# Patient Record
Sex: Female | Born: 1950
Health system: Southern US, Community
[De-identification: ages and names within clinical notes are randomized; demographics above are authoritative.]

## PROBLEM LIST (undated history)

## (undated) DIAGNOSIS — M1990 Unspecified osteoarthritis, unspecified site: Secondary | ICD-10-CM

## (undated) DIAGNOSIS — R519 Headache, unspecified: Secondary | ICD-10-CM

## (undated) DIAGNOSIS — I10 Essential (primary) hypertension: Secondary | ICD-10-CM

## (undated) DIAGNOSIS — E785 Hyperlipidemia, unspecified: Secondary | ICD-10-CM

## (undated) DIAGNOSIS — I2510 Atherosclerotic heart disease of native coronary artery without angina pectoris: Secondary | ICD-10-CM

## (undated) DIAGNOSIS — K219 Gastro-esophageal reflux disease without esophagitis: Secondary | ICD-10-CM

## (undated) HISTORY — PX: CHOLECYSTECTOMY: SHX55

## (undated) HISTORY — DX: Atherosclerotic heart disease of native coronary artery without angina pectoris: I25.10

## (undated) HISTORY — DX: Hyperlipidemia, unspecified: E78.5

## (undated) HISTORY — PX: CORONARY STENT PLACEMENT: SHX1402

## (undated) HISTORY — PX: PARTIAL HYSTERECTOMY: SHX80

---
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
## 1998-02-20 ENCOUNTER — Other Ambulatory Visit: Admission: RE | Admit: 1998-02-20 | Discharge: 1998-02-20 | Payer: Self-pay | Admitting: Gynecology

## 1999-11-18 HISTORY — PX: CARDIAC CATHETERIZATION: SHX172

## 2000-03-24 ENCOUNTER — Encounter: Payer: Self-pay | Admitting: Occupational Medicine

## 2000-03-24 ENCOUNTER — Ambulatory Visit (HOSPITAL_COMMUNITY): Admission: RE | Admit: 2000-03-24 | Discharge: 2000-03-24 | Payer: Self-pay | Admitting: Occupational Medicine

## 2000-08-12 ENCOUNTER — Ambulatory Visit (HOSPITAL_COMMUNITY): Admission: RE | Admit: 2000-08-12 | Discharge: 2000-08-13 | Payer: Self-pay | Admitting: Cardiology

## 2000-10-21 ENCOUNTER — Inpatient Hospital Stay (HOSPITAL_COMMUNITY): Admission: EM | Admit: 2000-10-21 | Discharge: 2000-10-23 | Payer: Self-pay | Admitting: Emergency Medicine

## 2001-03-03 ENCOUNTER — Ambulatory Visit (HOSPITAL_COMMUNITY): Admission: RE | Admit: 2001-03-03 | Discharge: 2001-03-04 | Payer: Self-pay | Admitting: Cardiology

## 2001-10-25 ENCOUNTER — Encounter (INDEPENDENT_AMBULATORY_CARE_PROVIDER_SITE_OTHER): Payer: Self-pay

## 2001-10-25 ENCOUNTER — Ambulatory Visit (HOSPITAL_COMMUNITY): Admission: RE | Admit: 2001-10-25 | Discharge: 2001-10-25 | Payer: Self-pay | Admitting: *Deleted

## 2002-03-17 ENCOUNTER — Ambulatory Visit (HOSPITAL_COMMUNITY): Admission: RE | Admit: 2002-03-17 | Discharge: 2002-03-17 | Payer: Self-pay | Admitting: Cardiology

## 2002-03-17 ENCOUNTER — Encounter: Payer: Self-pay | Admitting: Cardiology

## 2002-06-16 ENCOUNTER — Other Ambulatory Visit: Admission: RE | Admit: 2002-06-16 | Discharge: 2002-06-16 | Payer: Self-pay | Admitting: Gynecology

## 2003-07-08 ENCOUNTER — Encounter: Payer: Self-pay | Admitting: Internal Medicine

## 2003-07-08 ENCOUNTER — Encounter: Admission: RE | Admit: 2003-07-08 | Discharge: 2003-07-08 | Payer: Self-pay | Admitting: Internal Medicine

## 2003-09-14 ENCOUNTER — Ambulatory Visit (HOSPITAL_COMMUNITY): Admission: RE | Admit: 2003-09-14 | Discharge: 2003-09-15 | Payer: Self-pay | Admitting: Cardiology

## 2004-07-29 ENCOUNTER — Ambulatory Visit (HOSPITAL_COMMUNITY): Admission: RE | Admit: 2004-07-29 | Discharge: 2004-07-29 | Payer: Self-pay | Admitting: Cardiology

## 2004-11-15 ENCOUNTER — Ambulatory Visit (HOSPITAL_BASED_OUTPATIENT_CLINIC_OR_DEPARTMENT_OTHER): Admission: RE | Admit: 2004-11-15 | Discharge: 2004-11-15 | Payer: Self-pay | Admitting: Urology

## 2004-11-15 ENCOUNTER — Ambulatory Visit (HOSPITAL_COMMUNITY): Admission: RE | Admit: 2004-11-15 | Discharge: 2004-11-15 | Payer: Self-pay | Admitting: Urology

## 2004-12-24 ENCOUNTER — Other Ambulatory Visit: Admission: RE | Admit: 2004-12-24 | Discharge: 2004-12-24 | Payer: Self-pay | Admitting: Gynecology

## 2005-01-24 ENCOUNTER — Encounter (INDEPENDENT_AMBULATORY_CARE_PROVIDER_SITE_OTHER): Payer: Self-pay | Admitting: Specialist

## 2005-01-24 ENCOUNTER — Ambulatory Visit (HOSPITAL_COMMUNITY): Admission: RE | Admit: 2005-01-24 | Discharge: 2005-01-24 | Payer: Self-pay | Admitting: *Deleted

## 2006-06-11 ENCOUNTER — Encounter (INDEPENDENT_AMBULATORY_CARE_PROVIDER_SITE_OTHER): Payer: Self-pay | Admitting: *Deleted

## 2006-06-11 ENCOUNTER — Ambulatory Visit (HOSPITAL_COMMUNITY): Admission: RE | Admit: 2006-06-11 | Discharge: 2006-06-11 | Payer: Self-pay | Admitting: *Deleted

## 2007-05-13 ENCOUNTER — Encounter: Admission: RE | Admit: 2007-05-13 | Discharge: 2007-05-13 | Payer: Self-pay | Admitting: Internal Medicine

## 2009-12-30 ENCOUNTER — Emergency Department (HOSPITAL_COMMUNITY): Admission: EM | Admit: 2009-12-30 | Discharge: 2009-12-30 | Payer: Self-pay | Admitting: Family Medicine

## 2010-01-31 ENCOUNTER — Emergency Department (HOSPITAL_COMMUNITY): Admission: EM | Admit: 2010-01-31 | Discharge: 2010-01-31 | Payer: Self-pay | Admitting: Emergency Medicine

## 2011-04-04 NOTE — Cardiovascular Report (Signed)
[place]  Patient:    Madison Mosley, Madison Mosley Visit Number: [id number] MRN: [id number]          Service Type: CAT Location: [facility] [id number] Attending Physician:  Silvestre Mesi Dictated by:   Aram Candela Aleen Campi, M.D. Proc. Date: 03/17/02 Admit Date:  03/17/2002 Discharge Date: 03/17/2002                          Cardiac Catheterization  PROCEDURES: 1. Left heart catheterization. 2. Coronary cineangiography. 3. Left ventricular cineangiography. 4. Perclose of the left femoral artery.  INDICATION FOR PROCEDURES:  This 60 year old female has a history of two-vessel coronary artery disease and is status post angioplasty of her proximal LAD and first diagonal branch in December 2001.  She then had a recurrence of unstable angina in April 2002 at which time she had angioplasty with stent placement in her mid right coronary artery.  She has done well until recently when she had onset of chest pain again and this was felt to be due to recurrent coronary stenosis with myocardial ischemia.  She was then scheduled for repeat cardiac catheterization and possible angioplasty.  DESCRIPTION OF PROCEDURE:  After signing an informed consent, the patient was premedicate with 50 mg of Benadryl intravenously and brought to the cardiac catheterization lab.  Her left groin was prepped and draped in a sterile fashion and anesthetized locally with 1% lidocaine.  A #6 French introducer sheath was inserted percutaneously into the left femoral artery.  The #6 [place] #4 Judkins coronary catheter were used to make injections into the native coronary arteries.  A #6 French pigtail catheter was used to measure pressures in the left ventricle and aorta and to make a midstream injection into the left ventricle.  The patient tolerated the procedure well and no complications were noted.  At the end of the procedure, the catheter and sheath were removed from the left femoral  artery and hemostasis was easily obtained with a Perclose closure system.  MEDICATIONS GIVEN:  None.  HEMODYNAMIC DATA: 1. Left ventricular pressure 143/5-20. 2. Aortic pressure 141/73 with a mean of 99. 3. Left ventricular ejection fraction 70%.  CINE FINDINGS:  CORONARY CINEANGIOGRAPHY: 1. Left coronary artery:  The ostium and left main appear normal.  2. Left anterior descending artery:  The proximal LAD is the site of prior    angioplasty with stent placement.  There has been a very minor restenosis    within the stent of approximately 20-30%.  There is very normal antegrade    flow and no evidence for flow restriction.  The first anterolateral branch    is a large branch which arises just distally to the stent and now appears    normal without evidence of any restenosis.  There is normal antegrade flow.  3. Circumflex coronary artery:  There is a minor focal 20-30% stenosis in the    proximal segment.  Otherwise, the circumflex appears normal with normal    antegrade flow.  4. Right coronary artery:  The ostium and proximal segment appears normal.    The middle segment is the segment of prior angioplasty with stent placement    and there is now a minor 10-20% restenosis within the proximal portion of    the stent.  Otherwise, there is very good and normal antegrade flow and no    evidence for flow restriction in the middle segment.  The distal segment at  the crux and posterolateral appears normal.  The posterior descending has a    focal 20% stenosis in its proximal segment.  There is normal distal runoff    throughout the right coronary system.  LEFT VENTRICULAR CINEANGIOGRAM:  The left ventricular chamber size and contractility appears normal.  The mitral and aortic valves appear normal.  FINAL DIAGNOSES: 1. History of two-vessel coronary artery disease with very good long-term    results in her angioplasty sites of proximal left anterior descending    artery with  stent, first diagonal with percutaneous transluminal coronary    angioplasty, mid right coronary artery with stent. 2. Normal left ventricular function. 3. Successful Perclose of the left femoral artery.  DISPOSITION:  The patient was returned to the short-stay unit for further monitoring prior to discharge later today.  She was instructed to continue on the same medications and on the same exercise regimen and follow up in the office in several weeks after discharge. Dictated by:   Aram Candela. Aleen Campi, M.D. Attending Physician:  Silvestre Mesi DD:  03/17/02 TD:  03/20/02 Job: [id number]

## 2011-04-04 NOTE — Cardiovascular Report (Signed)
[place]. [facility]  Patient:    Madison Mosley, Madison Mosley                     MRN: [id number] Proc. Date: 03/03/01 Adm. Date:  [id number] Disc. Date: [id number] Attending:  Silvestre Mesi CC:         Cardiac Catheterization Lab at [facility]                        Cardiac Catheterization  PROCEDURES: 1. Left heart catheterization. 2. Coronary cineangiography. 3. Left ventricular cineangiography. 4. Abdominal aortogram. 5. Angioplasty with primary stenting of the mid right coronary artery. 6. Perclose of the right femoral artery.  INDICATION FOR PROCEDURES:  This 60 year old female has a history of coronary artery disease status post coronary artery angioplasty of her proximal left anterior descending in December 2000.  This was followed by a pain free period of several months and the reoccurrence of her typical exertional angina.  A treadmill exercise tolerance test was double positive with exertional angina and ST segment depression.  She was then scheduled for repeat cardiac catheterization and possible angioplasty to look at the stented area in her LAD and also her other lesions which were mild to moderate in her circumflex and right coronary arteries.  PROCEDURE IN DETAIL:  After signing an informed consent, the patient was premedicated with 50 mg of Benadryl intravenously and brought to the cardiac catheterization lab.  Her right groin was prepped and draped in a sterile fashion and anesthetized locally with 1% lidocaine.  A 6-French introducer sheath was inserted percutaneously into the right femoral artery, and 6-French, #4 Judkins coronary cathethers were used to make injections into the coronary arteries.  A 6-French pigtail catheter was used to measure pressures in the left ventricle and aorta and to make midstream injections into the left ventricle and abdominal aorta.  After noting a good result in the stented area in her LAD with only very mild  restenosis of less then 20% and normal antegrade flow, we also noted a significant progression of disease in her mid right coronary artery lesion from 50% to 80% and corresponding to the area of ST abnormality on her treadmill.  We then discussed this with the patient and elected to proceed with an angioplasty procedure.  We selected a JR4 guide catheter, 6-French, which was advanced to the root of the aorta.  After inserting a short high-torque floppy guidewire, the tip of the guide catheter was then inserted into the ostium of the right coronary artery, and the guidewire was advanced through the mid right coronary artery lesion and positioned in the distal segment.  We then selected a 3.0 x 18 mm Multilink stent deployment system which was advanced over the guidewire and positioned within the right coronary artery lesion.  The stent was then deployed with two inflations, the first at 16 atmospheres for 20 seconds and the second at 16 atmospheres for 40 seconds.  After this final inflation, the balloon catheter was removed, and injection again in the right coronary artery showed an excellent angiographic result with 0% residual lesion and no evidence for dissection or clot.  The patient tolerated the procedure well, and no complications were noted at the end of the procedure.  The catheter and sheath were removed from the right femoral artery, and hemostasis was easily obtained with a Perclose closure system.  MEDICATIONS GIVEN:  Heparin 7600 units IV, nitroglycerin 200 units intracoronary.  HEMODYNAMIC DATA:  Left ventricular pressure 170/0 to 18, aortic pressure 163/83 with a mean of 119.  Left ventricular ejection fraction was estimated at 60%.  CINE FINDINGS:  CORONARY CINEANGIOGRAPHY:  Left coronary artery: The ostium and left main appear normal.  Left anterior descending artery: The proximal segment is the stented area, and there has been a mild restenosis of less than  20% in the entire length.  The first anterolateral branch also had angioplasty at its ostium, and this ostial lesion now appears normal.  There is very normal antegrade flow throughout the LAD and diagonal branch.  Circumflex coronary artery:  The circumflex has a moderate stenosis of 40 to 50% in its middle segment, and this appears to be mildly increased from the prior study last year.  There is normal antegrade flow.  Right coronary artery: The ostium appears normal.  There was a focal eccentric lesion in the middle segment with 80% maximum stenosis in a focal area just below the takeoff of the right ventricular branch.  The remainder of the right coronary artery appears normal.  LEFT VENTRICULAR CINEANGIOGRAM:  Left ventricular chamber size and contractility appear normal.  The mitral and aortic valves appear normal.  ABDOMINAL AORTOGRAM:  The abdominal aorta and renal arteries appear normal.  ANGIOPLASTY PROCEDURE:  Cine taken during the angioplasty procedure shows proper positioning of the guidewire and balloon catheter with a very good balloon form obtained.  Followup cine after stent deployment showed an excellent angiographic result with 0% residual lesion and no evidence of dissection or clot.  FINAL DIAGNOSES: 1. Good long-term appearance of the stented area in the proximal left    anterior descending artery and normal appearance of the angioplasty    site in the diagonal branch. 2. Significant progression of disease in the mid right coronary artery lesion,    now with a critical stenosis. 3. Mild to moderate stenosis, mid circumflex coronary artery with normal    flow. 4. Normal left ventricular function. 5. Normal mitral and aortic valves. 6. Normal abdominal aorta and renal arteries. 7. Successful angioplasty with primary stenting of the mid right coronary    artery lesion. 8. Successful Perclose of the right femoral artery.  DISPOSITION:  Will admit to the  short-stay unit for further monitoring today and plan for discharge in the a.m. DD:  03/03/01 TD:  03/04/01 Job: [id number]

## 2011-04-04 NOTE — Cardiovascular Report (Signed)
[place]. [facility]  Patient:    Madison Mosley, Madison Mosley                       MRN: [id number] Proc. Date: 10/22/00 Adm. Date:  [id number] Attending:  Silvestre Mesi CC:         Soyla Murphy. Renne Crigler, M.D.  Cath Lab, [facility]   Cardiac Catheterization  REFERRING PHYSICIAN:  Dr. Merri Brunette.  PROCEDURES: 1. Left heart catheterization. 2. Coronary cineangiography. 3. Left cineangiography. 4. Angioplasty with percutaneous transluminal coronary angioplasty of the    first diagonal branch through the side of the left anterior descending    stent. 5. Perclose of the right femoral artery.  INDICATIONS FOR PROCEDURE:  This 60 year old female has a history of since vessel coronary artery disease status post coronary artery angioplasty August 12, 2000 of her mid LAD, which blocked a mid diagonal branch and a diagonal branch ostial branch was dilated also at that same time.  She has had shortness of breath for several weeks and was referred for a Cardiolite study study yesterday, at which time it was positive for chest pain.  ST segment changes and the Cardiolite study was positive for anterolateral ischemia.  She was then admitted and scheduled for cardiac cath today.  PROCEDURE:  After signing an informed consent, the patient was premedicated with 50 mg of Benadryl intravenously and brought to the cardiac catheterization lab.  Her right groin was prepped and draped in a sterile fashion and anesthetized with 1% lidocaine  A 6-French introducer sheath was inserted percutaneously into the right femoral artery.  A 6-French 4 Judkins coronary catheters were used to make injections into the coronary arteries.  A 6-French pigtali catheter was used to measure pressures in the left ventricle and aorta and to make a mid stream injections into the left ventricle.  After noting severe restenosis in the ostial lesion in the first diagonal branch which arose from  the mid LAD stent, we elected to proceed with repeat angioplasty of this ostial lesion.  We inserted a 6-French JL4 guide catherter and a short high torque floppy guide wire and advanced through this to the aorta.  After engaging the ostium of the left coronary artery with the tip of th the guide wire was inserted into the LAD and easily passed through the side of the stent into the side of the diagonal branch.  We then selected a 2.5 x 15 mm CrossSail balloon catheter which was advanced over the guide wire and positioned within the ostial lesion of the first diagonal branch.  The tip was just barely into the diagonal branch and the remainder was through the stent and in the LAD.  One inflation was made at 22 atmospheres for 59 seconds. After deflating the balloon this catheter was removed and noted an excellent angiographic result in the diagonal branch, however, there was compromise in the distal stent just distal to the takeoff of this diagonal branch.  We then selected a 3.0 x 15 mg CrossSail balloon catheter which was advanced over a second high torque floppy guide wide and both were advanced into the mid LAD. The 3.0 CrossSail balloon catheter was positioned within the stent an done inflation was made at 16 atmospheres for 28 seconds.  After noting a very good response in the the LAD, we noted retightening of the ostial lesion in the fist diagonal.  We the reinserted the 2.5 x 15 mm CrossSail catheter over  the prior guide wire and positioned it within the ostium of the first diagonal.  When inflation was made at 22 atmospheres for 1 minute.  After this final inflation, this balloon catheter was removed and injection at the left coronary artery showed an excellent angiographic results, both within the LAD stent and within the ostial lesion of the first diagonal branch.  The patient tolerated the procedure well and no complications were noted at the end of the procedure.  The catheter  and sheath were removed from the right femoral artery and hemostasis was easily obtained with a Perclose closure system.  MEDICATIONS GIVEN:  Heparin 3000 IV, Integrilin drip per pharmacy protocol.  HEMODYNAMIC DATA:  Left ventricular pressure 124/10-28, aortic pressure 124/78 with a mean of 89.  LEFT VENTRICULAR CINEANGIOGRAM:  The left ventricular chamber size an contractility appear normal.  The left ventricular wall thickness appears normal.  The overall left ventricular contractility appears normal with an ejection fraction of 50-60%.  CORONARY ANGIOGRAPHY:  1.  LEFT CORONARY ARTERY:  The ostium and left main appeared normal.  2.  LEFT ANTERIOR DESCENDING:  The LAD showed a very good appearance within     the mid LAD stent with normal flow and  no evidence of significant     restenosis. The first diagonal branch was site of the prior angioplasty     and there was now a 95% ostial lesion arising from the side of the stent.  3.  RIGHT CORONARY ARTERY:  The right coronary artery is unchanged from the     prior study in September.  There are several moderate lesions with a 25%     stenosis in the proximal segment and a 50% stenosis at the acute angle.     There are minor lesions in the posterior descending.  ANGIOPLASTY CINEANGIOGRAPHY:  Cineangiography taken during the angioplasty procedure show proper positioning of the guide wire and balloon catheter with a good balloon form obtained during inflation of the diagonal branch. Cine taken after the first dilation showed a compromise of the LAD within the stent just distal to the he takeoff of the diagonal.  Further cineangiography showed proper positioning of the second balloon within the LAD, followed by a good angiographic result and final injections after the second dilatation in the diagonal branch showed an excellent result with a 10-20% residual lesion in the ostium of the first diagonal and normal appearance within the LAD  stent.  FINAL DIAGNOSES:  1. Single vessel coronary artery disease. 2. Restenosis in the first diagonal ostial lesion. 3. Good appearance of the left anterior descending stent. 4. Minor right coronary artery lesions with 50% lesion at the acute angle. 5. Normal left ventricular function. 6. Successful percutaneous transluminal coronary angioplasty of the first    diagonal branch. 7. Successful Peclose of the right femoral artery.  DISPOSITION:  The patient was transferred to the [facility] for further monitoring. Will anticipate discharge in the a.m.  Will continue the Integrilin drip for 18 hours. DD:  10/22/00 TD:  10/22/00 Job: [id number]

## 2011-04-04 NOTE — Cardiovascular Report (Signed)
[facility]  Patient:    Madison Mosley, Madison Mosley                       MRN: [id number] Proc. Date: 08/12/00 Adm. Date:  [id number] Attending:  Silvestre Mesi CC:         Soyla Murphy. Renne Crigler, M.D.   Cardiac Catheterization  REFERRING PHYSICIAN:  Soyla Murphy. Renne Crigler, M.D.  PROCEDURE: 1. Left heart catheterization. 2. Coronary cineangiography. 3. Left internal mammary artery cineangiography. 4. Left ventricular cineangiography. 5. Abdominal aortogram. 6. Angioplasty with stent placement of the mid LAD. 7. PTCA of the first diagonal branch. 8. Perclose of the right femoral artery.  INDICATIONS:  This 60 year old female presented with a one-month history of anterior chest pain and left arm pain on exertion.  She underwent a treadmill exercise tolerance test in the office which showed a markedly early positive test with 3 mm ST depression at a heart rate of less than 140.  She also has a history of hypertension and a very strong family history of ischemic heart disease with her mother dying of a massive heart attack at age 76.  DESCRIPTION OF PROCEDURE:  After signing an informed consent, the patient was premedicated with 50 mg of Benadryl intravenously and brought to the cardiac catheterization lab at [facility].  Her right groin was prepped and draped in the usual sterile fashion and anesthetized locally with 1% lidocaine.  A #6 French introducer sheath was inserted percutaneously into the right femoral artery.  A 6 French #4 Judkins coronary catheters were used to make injections into the coronary arteries.  The right coronary catheter was used to make a midstream injection into the left subclavian artery visualizing the left internal mammary artery.  A 6 French pigtail catheter was used to measure pressures in the left ventricle and aorta and to make a midstream injection into the left ventricle and abdominal aorta.  After noting single  vessel coronary artery disease with a critical stenosis in her mid LAD that also involved the takeoff of the first large diagonal branch, she discussed these findings with [place] and elected to have [place] proceed with an angioplasty procedure.  We selected a 6 [place] guide catheter which was advanced to the root of the aorta.  We then selected a Y-introducer that would allow two guide wires and selected two short Hi-Torque floppy guide wires. We first inserted a guide wire through the guide catheter and advanced it into the LAD and across the mid LAD lesion.  We then inserted the second guide wire into the LAD and after moderate difficulty it was advanced into the large first diagonal branch.  We then selected a 3.0 x 15 mm crossail balloon catheter which was advanced over the first guide wire and positioned within the mid LAD lesion.  One inflation was made at 8 atmospheres for 45 seconds. After noting a good angiographic result, but with haziness and a dissection line in the distal segment of the LAD lesion, we then elected to proceed with a stent placement.  We selected a 3.0 x 18 mm Velocity stent deployment system which was advanced over the first guide wire and positioned within the mid LAD lesion. The stent was deployed with one inflation at 16 atmospheres for 51 seconds.  After deploying the stent, the deployment balloon was removed and injection again into the LAD showed an excellent angiographic result within the stented area in the mid LAD  lesion, however, there was a very slow flow into the first diagonal branch as the stent had compromised the ostium of the diagonal branch.  We then attempted to pass the second guide wire back across the stent into the diagonal branch, but were unable to pass this guide wire through the site of the stent.  We then selected a crosset XT guide wire which we advanced into the LAD and with this guide wire, we were able to advance the tip through the  site of the stent into the diagonal branch and advance it into the distal portion of the diagonal branch.  We then selected a 2.5 x 9 mm Quantum balloon catheter which was advanced over this Crosset wire and advanced into the ostium of the diagonal branch with positioning the balloon within the sidewall of the stent, we then inflated this noncompliant balloon to 6 atmospheres for a total of 77 seconds.  After noting a good opening, this balloon catheter was removed and injection again into the left coronary artery showed an excellent opening and repositioning of the stent over the ostium of the diagonal branch allowing full and free flow and no evidence of restriction into the diagonal branch. The patient tolerated the procedure well and no complications were noted.  At the end of the procedure the catheter and sheath were removed from the right femoral artery and hemostasis was easily obtained with a Perclose closure system.  MEDICATIONS GIVEN:  Versed 1 mg IV, Versed 2 mg IV, heparin 6000 units IV, Integrilin per pharmacy protocol.  HEMODYNAMIC DATA:  Left ventricular pressure 162/10 to 21, aortic pressure 157/83 with a mean of 112.  Left ventricular ejection fraction was measured at 63%.  CINE FINDINGS:  Coronary cineangiography.  Left coronary artery.  The ostium and left main appear normal.  Left anterior descending.  The LAD has a segmental lesion in the proximal segment of 50% which extends into the middle segment and causes a 90% stenosis in the middle segment.  The first large diagonal branch arises from the area of this 90% lesion.  The distal LAD appears normal with only very minor irregularities.  Circumflex coronary artery.  The circumflex has a 40% stenotic lesion in its proximal segment, but middle and distal segments appear normal.  Right coronary artery.  The right coronary artery has minor 20% stenotic plaque in the middle segment at the acute angle and there are  minor irregularities in the distal segment.  There is good antegrade flow into the  distal posterior descending and posterolateral branches.  Left internal mammary artery.  Appears normal.  Left ventricular cineangiogram.  The left ventricular chamber size and contractility appear normal.  The mitral valve has mild prolapse of the posterior leaflet.  There is no significant mitral insufficiency and the aortic valve appears normal.  Abdominal aortogram.  The abdominal aorta appears normal as well as the renal arteries.  Angioplasty cines.  Cines taken during the angioplasty procedure shows proper positioning of the guide wires and balloon catheters.  Also stent deployment was documented to be positioned within the lesion.  Follow-up cines after the stent deployment showed compromised flow in the large diagonal branch. Further cines showed proper positioning of the balloon catheter in the diagonal branch through the site of the LAD site and final injections into the left coronary artery showed an excellent angiographic result with 0% residual lesion in the LAD, normal antegrade flow, no evidence of dissection or clot, and normal unrestricted flow into  the large diagonal branch.  FINAL DIAGNOSES: 1. Single vessel coronary artery disease with 90% stenotic lesion in the mid    LAD which involves the takeoff of the first diagonal branch. 2. Normal left internal mammary artery. 3. Normal left ventricular function. 4. Mild mitral valve prolapse and normal aortic valve. 5. Normal abdominal aorta and renal arteries. 6. Successful PTCA and stent placement of the mid LAD lesion. 7. Successful opening of the first diagonal branch which was jailed by    the LAD stent. 8. Successful Perclose of the right femoral artery.  DISPOSITION:  Will monitor on the EAU overnight and plan for discharge in the a.m. DD:  08/12/00 TD:  08/12/00 Job: [id number]

## 2011-04-04 NOTE — Op Note (Signed)
[facility]  Patient:    Madison Mosley, Madison Mosley Visit Number: [id number] MRN: [id number]          Service Type: END Location: ENDO Attending Physician:  Sabino Gasser Dictated by:   Sabino Gasser, M.D. Proc. Date: 10/25/01 Admit Date:  10/25/2001                             Operative Report  PROCEDURE:  Colonoscopy.  INDICATIONS:  Polyp. Colon cancer screening.  ANESTHESIA:  Demerol 25 mg, Versed 2 mg.  DESCRIPTION OF PROCEDURE:  With patient mildly sedated in a left lateral decubitus position, the Olympus videoscopic colonoscope was inserted into the rectum, passed under direct vision into the cecum, identified by the ileocecal valve and appendiceal orifice, both of which were photographed. From this point, the colonoscope was fully withdrawn taking circumferential views of the entire colonic mucosa, stopping first in the ascending colon just a few folds removed from the cecum where a polyp was seen, photographed, and removed; first, with hot biopsy forceps and subsequently we ensnared the remainder using snare cautery technique, Bovie setting of 20/20 of blended current. This was done until we reached the rectosigmoid at approximately 30 cm from the anal verge at which point another polyp was seen and removed using just hot biopsy forceps technique setting at 20/20 of blended current. When we pulled back the rectum, which appeared normal, direct view showed hemorrhoids in retroflex view. The endoscope was straightened and withdrawn. The patients vital signs and pulse oximeter remained stable. The patient tolerated the procedure well. There were no apparent complications.  FINDINGS:  Polyps of right colon and rectosigmoid area. Internal hemorrhoids were noted as well.  PLAN:   Await biopsy report. Patient will call Dr. Virginia Rochester for results and followup with Dr. Virginia Rochester as an outpatient. Dictated by:   Sabino Gasser, M.D. Attending Physician:  Sabino Gasser DD:   10/25/01 TD:  10/25/01 Job: [id number]

## 2011-04-04 NOTE — Op Note (Signed)
[facility]  Patient:    Madison Mosley, Madison Mosley Visit Number: [id number] MRN: [id number]          Service Type: END Location: ENDO Attending Physician:  Sabino Gasser Dictated by:   Sabino Gasser, M.D. Admit Date:  10/25/2001                             Operative Report  PROCEDURE:  Upper endoscopy.  ENDOSCOPIST:  Sabino Gasser, M.D.  INDICATIONS:  GERD.  ANESTHESIA:  Demerol 50, Versed 7 mg.  DESCRIPTION OF PROCEDURE:  With the patient mildly sedated in the left lateral decubitus position the Olympus videoscopic endoscope was inserted in the mouth and passed under direct vision through the esophagus which was normal until we reached the distal esophagus where a hiatal hernia was seen and photographed from above.  There was a possible area of Barretts seen and photographed with this, and subsequently was biopsies.  Once accomplished we entered the stomach.  The fundus, body, antrum, duodenal bulb, second portion of the duodenum all were visualized.  From this point, the endoscope was slowly withdrawn taken circumferential views of the entire duodenal mucosa until the endoscope was then pulled back into the stomach, placed in retroflexion reviewing the stomach from below.  The endoscopy was then straightened and withdrawn taking circumferential views of the entire gastric, and subsequently esophageal mucosa which otherwise appeared normal.  The patients vital signs and pulse oximeter remained stable.  The patient tolerated the procedure well without apparent complications.  FINDINGS:  Small hiatal hernia with questionable Barretts esophagus, awaiting biopsy report.  The patient will call me for results and follow up with me as an outpatient.  Proceed to colonoscopy as planned. Dictated by:   Sabino Gasser, M.D. Attending Physician:  Sabino Gasser DD:  10/25/01 TD:  10/25/01 Job: [id number]

## 2011-04-04 NOTE — Op Note (Signed)
NAME:  Madison Mosley, Madison Mosley NO.:  [id number]   MEDICAL RECORD NO.:  [id number]          PATIENT TYPE:  AMB   LOCATION:  ENDO                         FACILITY:  [facility]   PHYSICIAN:  Georgiana Spinner, M.D.    DATE OF BIRTH:  1951-10-31   DATE OF PROCEDURE:  01/24/2005  DATE OF DISCHARGE:                                 OPERATIVE REPORT   PROCEDURE:  Upper endoscopy.   INDICATIONS FOR PROCEDURE:  Endoscopic Barrett's with GERD.   ANESTHESIA:  Demerol 60, Versed 8 mg.   DESCRIPTION OF PROCEDURE:  With the patient mildly sedated in the left  lateral decubitus position, the Olympus videoscopic endoscope was inserted  in the mouth and passed under direct vision through the esophagus which  appeared normal until we reached the distal esophagus, and there were clear-  cut changes of Barrett's photographed and biopsied.  We entered into the  stomach.  The fundus and body appeared normal.  The antrum showed linear  erythema.  This was photographed and biopsied.  The duodenal bulb and second  portion of the duodenum were normal.  From this point, the endoscope was  slowly withdrawn, taking circumferential views of the duodenal mucosa until  the endoscope had been pulled back into the stomach, placed in retroflexion,  viewing the stomach from below.  The endoscope was straightened and  withdrawn, taking circumferential views of the remaining gastric and  esophageal mucosa.  The patient's vital signs and pulse oximetry remained  stable.  The patient tolerated the procedure well without apparent  complications.   FINDINGS:  1.  Barrett's esophagus, biopsied.  2.  Linear erythema of antrum, biopsied.   PLAN:  Await biopsy report.  The patient will call me for results and follow  up with me as an outpatient.      GMO/MEDQ  D:  01/24/2005  T:  01/24/2005  Job:  [id number]

## 2011-04-04 NOTE — Op Note (Signed)
NAME:  Madison Mosley, Madison Mosley              ACCOUNT NO.:  [id number]   MEDICAL RECORD NO.:  [id number]          PATIENT TYPE:  AMB   LOCATION:  ENDO                         FACILITY:  [facility]   PHYSICIAN:  Georgiana Spinner, M.D.    DATE OF BIRTH:  [birth year]   DATE OF PROCEDURE:  DATE OF DISCHARGE:                                 OPERATIVE REPORT   PROCEDURE:  Upper endoscopy.   INDICATIONS:  1.  Gastroesophageal reflux disease.  2.  Endoscopic Barrett's.   ANESTHESIA:  Fentanyl 50 mcg, Versed 8 mg.   PROCEDURE:  With the patient mildly sedated in the left lateral decubitus  position, the Olympus videoscopic endoscope was inserted in the mouth and  passed under direct vision through the esophagus, which appeared normal  except for the distal esophagus, where there was changes of possible  Barrett's esophagus seen and photographed and biopsied.  We then entered  into the stomach.  Fundus, body, antrum, duodenal bulb, second portion of  duodenum were visualized.  From this point, the endoscope was then slowly  withdrawn taking circumferential views of duodenal mucosa until the  endoscope had been pulled back into the stomach, placed in retroflexion to  view the stomach from below.  The endoscope was straightened and withdrawn  taking circumferential views of remaining gastric and esophageal mucosa,  stopping to biopsy some erythematous changes seen in the fundus of the  stomach.  The endoscope was then withdrawn.  The patient's vital signs,  pulse oximeter remained stable.  The patient tolerated procedure well  without apparent complications.   FINDINGS:  Question of Barrett's esophagus, once again biopsied.   PLAN:  Await biopsy reports.  The patient will call me for results and  follow-up with me as an outpatient.           ______________________________  Georgiana Spinner, M.D.     GMO/MEDQ  D:  06/11/2006  T:  06/11/2006  Job:  [id number]

## 2011-04-04 NOTE — Cardiovascular Report (Signed)
[facility]  Patient:    Madison Mosley, Madison Mosley                       MRN: [id number] Proc. Date: 10/22/00 Adm. Date:  [id number] Attending:  Silvestre Mesi CC:         Soyla Murphy. Renne Crigler, M.D.  Catheterization Lab, [facility]   Cardiac Catheterization  PROCEDURES: 1. Left heart catheterization. 2. Coronary cineangiography. 3. Left ventricular cineangiography. 4. Angioplasty with percutaneous transluminal coronary angioplasty of the    first diagonal branch through the side of the left anterior descending    artery stent. 5. Perclose of the right femoral artery.  INDICATION FOR PROCEDURES:  This 60 year old female has a history of single-vessel coronary artery disease status post coronary artery angioplasty on August 12, 2000, of her mid LAD which blocked a first diagonal branch, and the first diagonal branch ostial lesion was dilated also at that same time.  She has had shortness of breath for several weeks and was referred for a Persantine Cardiolite study yesterday at which time it was positive for chest pain, ST segment changes, and the Cardiolite study was positive for anterolateral ischemia.  She was then admitted and scheduled for cardiac catheterization today.  PROCEDURE IN DETAIL:  After signing an informed consent, the patient was premedicated with 50 mg of Benadryl intravenously and brought to the cardiac catheterization lab.  Her right groin was prepped and draped in a sterile fashion and anesthetized locally with 1% lidocaine.  A 6-French introducer sheath was inserted percutaneously into the right femoral artery, and 6-French, #4 Judkins coronary cathethers were used to make injections into the native coronary arteries.  The 6-French pigtail catheter was used to measure pressures in the left ventricle and aorta and to make midstream injections into the left ventricle.  After noting severe restenosis in the ostial  lesion in the first diagonal branch which arose from the side of the mid LAD stent, we elected to proceed with repeat angioplasty of this ostial lesion.  We inserted a 6-French JL4 guide catheter and a short high torque floppy guidewire and advanced to this to the root of the aorta.  After engaging the ostium of the left coronary artery with the tip of the guide catheter, the guidewire was inserted into the LAD and easily passed through the side of the stent into the first diagonal branch.  We then selected a 2.5 x 15 mm PowerSail balloon catheter which was advanced over the guidewire and positioned within the ostial lesion of the first diagonal branch.  The tip was just barely into the diagonal branch, and the remainder was through the stent and in the LAD.  One inflation was made at 22 atmospheres for 59 seconds. After deflating the balloon, the balloon catheter was removed, and we noted an excellent angiographic result in the diagonal branch; however, there was compromise in the distal stent just distal to the takeoff of this diagonal branch.  We then selected a 3.0 x 15 mm CrossSail balloon catheter which was advanced over a second high torque floppy guidewire, and both were advanced into the mid LAD.  The 3.0 CrossSail balloon catheter was positioned within the stent, and one inflation was made at 16 atmospheres for 28 seconds.  After noting a very good response in the LAD, we then noted retightening of the ostial lesion in the first diagonal.  We then reinserted the 2.5 x 15 mm PowerSail balloon  catheter over the prior guidewire and positioned it within the ostium of the first diagonal.  One further inflation was made at 22 atmospheres for 1 minute.  After this final inflation, this balloon catheter was removed, and injection again into the left coronary artery showed an excellent angiographic result both within the LAD stent and within the ostial lesion of the first diagonal  branch.  The patient tolerated the procedure well, and no complications were noted at the end of the procedure.  The catheter and sheath were removed from the right femoral artery, and hemostasis was easily obtained with a Perclose closure system.  MEDICATIONS GIVEN:  Heparin 3000 units IV, Integrilin IV drip per pharmacy protocol.  HEMODYNAMIC DATA:  Left ventricular pressure 124/10 to 28, aortic pressure 124/78 with a mean of 89.  LEFT VENTRICULAR CINEANGIOGRAM: Left ventricular chamber size and contractility appeared normal.  The left ventricular wall thickness appears normal.  The overall left ventricular contractility appears normal with an ejection fraction of between 50 and 60%.  CORONARY CINEANGIOGRAPHY:  Left coronary artery: The ostium and left main appear normal.  Left anterior descending artery: The LAD showed very good appearance within the mid LAD stent with normal flow and no evidence for significant restenosis. The first diagonal branch was the site of the prior angioplasty, and there was now a 95% ostial lesion arising from the site of the stent.  Right coronary artery: The right coronary artery is unchanged from the prior study in September.  There are several moderate lesions with a 25% stenosis in the proximal segment and a 50% stenosis at the acute angle.  There are minor lesions in the posterior descending.  ANGIOPLASTY CINE:  Cine taken during the angioplasty procedure shows proper positioning of the guidewire and balloon catheter with a good balloon form obtained during inflation of the diagonal branch.  Cine taken after the first dilation showed a compromise of the LAD within the stent just distal to the takeoff of the diagonal.  Further cine showed proper positioning of the second balloon within the LAD followed by a good angiographic result, and final injections after the second dilation in the diagonal branch showed an excellent angiographic result  with a 10 to 20% residual lesion in the ostium  of the first diagonal branch and normal appearance within the LAD stent.  FINAL DIAGNOSES: 1. Single-vessel coronary artery disease. 2. Restenosis in the first diagonal ostial lesion. 3. Good appearance of the left anterior descending artery stent. 4. Minor right coronary artery lesions with 50% lesion at the acute angle. 5. Normal left ventricular function. 6. Successful percutaneous transluminal coronary angioplasty of the first    diagonal branch. 7. Successful Perclose of the right femoral artery.  DISPOSITION:  The patient was transferred to the EAU for further monitoring. Will anticipate discharge in the a.m.  Will continue the Integrilin drip for 18 hours. DD:  10/22/00 TD:  10/23/00 Job: [id number]

## 2011-04-04 NOTE — Op Note (Signed)
NAME:  [name], Madison Mosley NO.:  [id number]   MEDICAL RECORD NO.:  [id number]          PATIENT TYPE:  AMB   LOCATION:  ENDO                         FACILITY:  [facility]   PHYSICIAN:  Georgiana Spinner, M.D.    DATE OF BIRTH:  [birth year]   DATE OF PROCEDURE:  01/24/2005  DATE OF DISCHARGE:                                 OPERATIVE REPORT   PROCEDURE:  Colonoscopy.   INDICATIONS FOR PROCEDURE:  Colon polyps.   ANESTHESIA:  Demerol 20, Versed 2 mg.   DESCRIPTION OF PROCEDURE:  With the patient mildly sedated in the left  lateral decubitus position, the Olympus videoscopic colonoscope was inserted  in the rectum and passed under direct vision to the cecum, identified by the  ileocecal valve and appendiceal orifice, both of which were photographed.  From this point, the colonoscope was slowly withdrawn, taking  circumferential views of the colonic mucosa, stopping only in the rectum  which appeared normal on direct and showed hemorrhoids on retroflex view.  The endoscope was straightened and withdrawn.  The patient's vital signs and  pulse oximetry remained stable.  The patient tolerated the procedure well  without apparent complications.   FINDINGS:  Internal hemorrhoids.  Otherwise, unremarkable colonoscopic  examination.   PLAN:  Repeat examination in approximately five years.      GMO/MEDQ  D:  01/24/2005  T:  01/24/2005  Job:  [id number]

## 2011-04-04 NOTE — Op Note (Signed)
NAME:  Madison Mosley, Madison Mosley              ACCOUNT NO.:  [id number]   MEDICAL RECORD NO.:  [id number]          PATIENT TYPE:  AMB   LOCATION:  [facility]                         FACILITY:  [facility]   PHYSICIAN:  Mark C. Vernie Ammons, M.D.  DATE OF BIRTH:  1951/08/04   DATE OF PROCEDURE:  11/15/2004  DATE OF DISCHARGE:                                 OPERATIVE REPORT   PREOPERATIVE DIAGNOSES:  Stress urinary incontinence.   POSTOPERATIVE DIAGNOSES:  Stress urinary incontinence.   PROCEDURE:  Suburethral sling obturator approach.   SURGEON:  Mark C. Vernie Ammons, M.D.   ANESTHESIA:  General.   ESTIMATED BLOOD LOSS:  Approximately 25 mL.   DRAINS:  None.   SPECIMENS:  None.   COMPLICATIONS:  None.   INDICATIONS FOR PROCEDURE:  The patient is a 60 year old white female with  significant stress urinary incontinence. She has been worse over the past 3  or 4 months. She maps the bathrooms and also is found to have some urgency  but on urodynamic study she was found to have only mild bladder instability  but primarily external sphincter deficiency with low Valsalva leak point  pressure.  We discussed the options, risks and complications, the patient  has elected to proceed with surgical correction.   DESCRIPTION OF PROCEDURE:  After informed consent, the patient was brought  to the major OR, placed on the table, administered general anesthesia and  then moved to the dorsal lithotomy position.  Her genitalia was sterilely  prepped and draped and a 16 French Foley catheter was placed in the bladder  and a weighted speculum in the vagina.  7 mL of 0.5% Marcaine with  epinephrine were used to infiltrate the subvaginal mucosa and after allowing  adequate time for epinephrine affect, a midline incision was then made as  well as two incisions at the lateral aspect of the obturator fossa noted by  palpation at the level of the clitoris.  The stab incisions were made in the  skin crease between the perineum and  inner thigh region.  I then dissected  laterally beneath the vaginal mucosa and was able to palpate the urethra and  the Foley catheter and noted the mid urethral location. A curved trocar was  then passed through the skin incision through the obturator fossa and back  behind the symphysis pubis and directed out against my finger at the mid  urethral level first on the left and then right sides.  The sling material  was affixed to the obturators, these were then brought back through the skin  incisions and I then removed the Foley catheter and inserted a 21 French  cystoscope with the 70 degree lens. The bladder was fully inspected and  noted to be free of any tumor, stones or inflammatory lesions. No evidence  of injury, perforation or foreign body was noted.  I then drained the  bladder and removed the cystoscope replacing the Foley catheter. I then  positioned the sling at the mid urethra, removed the plastic sheaths on  either side adjusting the tension so that there was no tension and then  excising the excess sling material to the skin level.  The skin incisions  were closed with Dermabond after each was irrigated with antibiotic solution  and the vaginal incision was irrigated copiously with antibiotic solution  and then closed with running 2-0 Vicryl suture.  The Foley catheter was  removed, the patient was awakened and taken to the recovery room in stable  satisfactory condition.  She tolerated the procedure well, there were no  intraoperative complications.   Her followup will be with me in one week.  She will be given a prescription  for 36 Vicodin ES and 15 amoxicillin 500 mg t.i.d.     Loraine Leriche   MCO/MEDQ  D:  11/15/2004  T:  11/15/2004  Job:  [id number]

## 2011-09-03 ENCOUNTER — Other Ambulatory Visit: Payer: Self-pay | Admitting: Gynecology

## 2011-09-03 DIAGNOSIS — R928 Other abnormal and inconclusive findings on diagnostic imaging of breast: Secondary | ICD-10-CM

## 2011-09-22 ENCOUNTER — Ambulatory Visit
Admission: RE | Admit: 2011-09-22 | Discharge: 2011-09-22 | Disposition: A | Discharge status: Home / Self Care | Payer: 59 | Source: Ambulatory Visit | Attending: Gynecology | Admitting: Gynecology

## 2011-09-22 ENCOUNTER — Other Ambulatory Visit: Payer: Self-pay | Admitting: Physician Assistant

## 2011-09-22 ENCOUNTER — Other Ambulatory Visit: Payer: Self-pay | Admitting: Obstetrics and Gynecology

## 2011-09-22 DIAGNOSIS — R928 Other abnormal and inconclusive findings on diagnostic imaging of breast: Secondary | ICD-10-CM

## 2012-08-31 ENCOUNTER — Other Ambulatory Visit: Payer: Self-pay | Admitting: Obstetrics and Gynecology

## 2012-09-03 ENCOUNTER — Other Ambulatory Visit: Payer: Self-pay | Admitting: Obstetrics and Gynecology

## 2012-09-03 DIAGNOSIS — R928 Other abnormal and inconclusive findings on diagnostic imaging of breast: Secondary | ICD-10-CM

## 2012-09-09 ENCOUNTER — Ambulatory Visit
Admission: RE | Admit: 2012-09-09 | Discharge: 2012-09-09 | Disposition: A | Discharge status: Home / Self Care | Payer: 59 | Source: Ambulatory Visit | Attending: Obstetrics and Gynecology | Admitting: Obstetrics and Gynecology

## 2012-09-09 DIAGNOSIS — R928 Other abnormal and inconclusive findings on diagnostic imaging of breast: Secondary | ICD-10-CM

## 2013-04-20 ENCOUNTER — Encounter: Payer: Self-pay | Admitting: Cardiovascular Disease

## 2013-04-20 ENCOUNTER — Ambulatory Visit (INDEPENDENT_AMBULATORY_CARE_PROVIDER_SITE_OTHER): Payer: 59 | Admitting: Cardiovascular Disease

## 2013-04-20 VITALS — BP 122/82 | HR 66 | Resp 16 | Ht 68.0 in | Wt 186.0 lb

## 2013-04-20 DIAGNOSIS — I10 Essential (primary) hypertension: Secondary | ICD-10-CM

## 2013-04-20 DIAGNOSIS — Z9889 Other specified postprocedural states: Secondary | ICD-10-CM

## 2013-04-20 DIAGNOSIS — I2510 Atherosclerotic heart disease of native coronary artery without angina pectoris: Secondary | ICD-10-CM

## 2013-04-20 DIAGNOSIS — Z9861 Coronary angioplasty status: Secondary | ICD-10-CM

## 2013-04-20 DIAGNOSIS — E785 Hyperlipidemia, unspecified: Secondary | ICD-10-CM

## 2013-04-20 NOTE — Patient Instructions (Signed)
Keep up the excellent job with your diet. Your physician recommends that you schedule a follow-up appointment in: 1 year.

## 2013-04-21 ENCOUNTER — Encounter: Payer: Self-pay | Admitting: Cardiovascular Disease

## 2013-04-21 DIAGNOSIS — I2510 Atherosclerotic heart disease of native coronary artery without angina pectoris: Secondary | ICD-10-CM | POA: Insufficient documentation

## 2013-04-21 DIAGNOSIS — I10 Essential (primary) hypertension: Secondary | ICD-10-CM | POA: Insufficient documentation

## 2013-04-21 DIAGNOSIS — E7800 Pure hypercholesterolemia, unspecified: Secondary | ICD-10-CM | POA: Insufficient documentation

## 2013-04-21 NOTE — Assessment & Plan Note (Signed)
Asymptomatic roughly 12 years status post last revascularization procedure. She remains on aspirin and clopidogrel therapy blood pressure is well controlled. She is on the maximum tolerated dose of pravastatin and has not tolerated any other cholesterol-lowering medications. Although her LDL is not at target, I believe this is the best he can do pharmacological therapy. Any additional reduction will depend on her efforts at weight loss and increase physical activity.

## 2013-04-21 NOTE — Assessment & Plan Note (Signed)
Intolerance to most statins Crestor Lipitor Zocor Livalo; intolerant to doses of pravastatin greater than 20 mg; intolerance to WelChol in either powder or tablet form; intolerance to zetia.  Off medications her total cholesterol is 262 and the LDL is 156. On pravastatin 20 mg daily the total cholesterol was 212 and the LDL was 111

## 2013-04-21 NOTE — Assessment & Plan Note (Addendum)
Fair control.

## 2013-04-21 NOTE — Progress Notes (Signed)
Patient ID: Madison Mosley, female   DOB: [birth year], 62 y.o.   MRN: [id number]      Reason for office visit Ms. Fischler has early onset coronary artery disease and underwent 3 successive revascularization procedures in rapid succession in 2001-2002. She has remained asymptomatic since that time and has not required any new interventions. In 2011 she had a normal myocardial perfusion study and was able to exercise for a total of 7-1/2 minutes on the Bruce protocol. She remains asymptomatic.  The major challenge has been identifying a satisfactory LDL cholesterol lowering strategy since she is intolerant to most lipid lowering medications. Most recently tried to use livalo, but with recurrence of intolerable myalgia. She is back on low-dose pravastatin which is the only thing she seems to tolerate. Her last LDL cholesterol was 111. While this is short of our desired target it is a great improvement from baseline levels.    No Known Allergies  Current Outpatient Prescriptions  Medication Sig Dispense Refill  . acetaminophen (TYLENOL) 500 MG tablet Take 500 mg by mouth every 6 (six) hours as needed for pain.      Marland Kitchen aspirin 81 MG tablet Take 81 mg by mouth daily.      . clopidogrel (PLAVIX) 75 MG tablet Take 75 mg by mouth daily.      . Coenzyme Q10 (COQ-10) 200 MG CAPS Take 1 capsule by mouth daily.      . folic acid (FOLVITE) 1 MG tablet Take 1 mg by mouth daily.      Marland Kitchen KRILL OIL 1000 MG CAPS Take 1 capsule by mouth daily.      . metoprolol succinate (TOPROL-XL) 50 MG 24 hr tablet Take 25 mg by mouth daily.      . Multiple Vitamin (MULTIVITAMIN WITH MINERALS) TABS Take 1 tablet by mouth daily.      . pravastatin (PRAVACHOL) 20 MG tablet Take 1 tablet by mouth daily.      Marland Kitchen triamterene-hydrochlorothiazide (MAXZIDE-25) 37.5-25 MG per tablet Take 1 tablet by mouth daily.       No current facility-administered medications for this visit.    Past Medical History  Diagnosis Date  .  Hyperlipidemia   . CAD (coronary artery disease)     stents    Past Surgical History  Procedure Laterality Date  . Cardiac catheterization  2001    Percutaneous revascularization precedure with angioplasty to the proximal LAD and first diagonal     No family history on file.  History   Social History  . Marital Status: Married    Spouse Name: N/A    Number of Children: N/A  . Years of Education: N/A   Occupational History  . Not on file.   Social History Main Topics  . Smoking status: Never Smoker   . Smokeless tobacco: Not on file  . Alcohol Use: Not on file  . Drug Use: Not on file  . Sexually Active: Not on file   Other Topics Concern  . Not on file   Social History Narrative  . No narrative on file    Review of systems: The patient specifically denies any chest pain at rest or with exertion, dyspnea at rest or with exertion, orthopnea, paroxysmal nocturnal dyspnea, syncope, palpitations, focal neurological deficits, intermittent claudication, lower extremity edema, unexplained weight gain, cough, hemoptysis or wheezing.  The patient also denies abdominal pain, nausea, vomiting, dysphagia, diarrhea, constipation, polyuria, polydipsia, dysuria, hematuria, frequency, urgency, abnormal bleeding or bruising, fever, chills, unexpected weight  changes, mood swings, change in skin or hair texture, change in voice quality, auditory or visual problems, allergic reactions or rashes, new musculoskeletal complaints other than usual "aches and pains".   PHYSICAL EXAM BP 122/82  Pulse 66  Resp 16  Ht 5\' 8"  (1.727 m)  General: Alert, oriented x3, no distress Head: no evidence of trauma, PERRL, EOMI, no exophtalmos or lid lag, no myxedema, no xanthelasma; normal ears, nose and oropharynx Neck: normal jugular venous pulsations and no hepatojugular reflux; brisk carotid pulses without delay and no carotid bruits Chest: clear to auscultation, no signs of consolidation by  percussion or palpation, normal fremitus, symmetrical and full respiratory excursions Cardiovascular: normal position and quality of the apical impulse, regular rhythm, normal first and second heart sounds, no murmurs, rubs or gallops Abdomen: no tenderness or distention, no masses by palpation, no abnormal pulsatility or arterial bruits, normal bowel sounds, no hepatosplenomegaly Extremities: no clubbing, cyanosis or edema; 2+ radial, ulnar and brachial pulses bilaterally; 2+ right femoral, posterior tibial and dorsalis pedis pulses; 2+ left femoral, posterior tibial and dorsalis pedis pulses; no subclavian or femoral bruits Neurological: grossly nonfocal   EKG: Normal sinus rhythm normal ECG   ASSESSMENT AND PLAN  CAD S/P percutaneous coronary angioplasty Asymptomatic roughly 12 years status post last revascularization procedure. She remains on aspirin and clopidogrel therapy blood pressure is well controlled. She is on the maximum tolerated dose of pravastatin and has not tolerated any other cholesterol-lowering medications. Although her LDL is not at target, I believe this is the best he can do pharmacological therapy. Any additional reduction will depend on her efforts at weight loss and increase physical activity.  Hyperlipidemia Intolerance to most statins Crestor Lipitor Zocor Livalo; intolerant to doses of pravastatin greater than 20 mg; intolerance to WelChol in either powder or tablet form; intolerance to zetia.  Off medications her total cholesterol is 262 and the LDL is 156. On pravastatin 20 mg daily the total cholesterol was 212 and the LDL was 111  Orders Placed This Encounter  Procedures  . EKG 12-Lead     Kenwood Rosiak  Thurmon Fair, MD, [facility] [phone] office [phone] pager

## 2013-04-26 ENCOUNTER — Encounter: Payer: Self-pay | Admitting: Cardiovascular Disease

## 2015-01-02 ENCOUNTER — Telehealth: Payer: Self-pay | Admitting: Physician Assistant

## 2015-01-02 ENCOUNTER — Encounter: Payer: Self-pay | Admitting: Physician Assistant

## 2015-01-02 NOTE — Telephone Encounter (Signed)
[facility] 01/05/15 with Wilburt FinlayBryan Hager, PA.  Records given to [employer] (medical records) for Bryan's schedule on 01/05/15. lp

## 2015-01-05 ENCOUNTER — Ambulatory Visit (INDEPENDENT_AMBULATORY_CARE_PROVIDER_SITE_OTHER): Payer: BLUE CROSS/BLUE SHIELD | Admitting: Physician Assistant

## 2015-01-05 ENCOUNTER — Encounter: Payer: Self-pay | Admitting: Physician Assistant

## 2015-01-05 VITALS — BP 144/84 | HR 65 | Ht 68.0 in | Wt 223.7 lb

## 2015-01-05 DIAGNOSIS — E669 Obesity, unspecified: Secondary | ICD-10-CM

## 2015-01-05 DIAGNOSIS — E785 Hyperlipidemia, unspecified: Secondary | ICD-10-CM

## 2015-01-05 DIAGNOSIS — Z9861 Coronary angioplasty status: Secondary | ICD-10-CM

## 2015-01-05 DIAGNOSIS — I2510 Atherosclerotic heart disease of native coronary artery without angina pectoris: Secondary | ICD-10-CM

## 2015-01-05 DIAGNOSIS — I10 Essential (primary) hypertension: Secondary | ICD-10-CM

## 2015-01-05 NOTE — Assessment & Plan Note (Signed)
No changes in current therapy. 

## 2015-01-05 NOTE — Assessment & Plan Note (Signed)
Continue statin. We discussed how to change her eating habits.

## 2015-01-05 NOTE — Patient Instructions (Signed)
Your physician recommends that you schedule a follow-up appointment in: One year with Dr. Croitoru.  

## 2015-01-05 NOTE — Assessment & Plan Note (Addendum)
No complaints of angina. Continue aspirin Plavix

## 2015-01-05 NOTE — Progress Notes (Signed)
Patient ID: Madison Mosley, female   DOB: 1951-04-04, 64 y.o.   MRN: [id number]    Date:  01/05/2015   ID:  Madison Mosley, DOB 03/05/1951, MRN [id number]  PCP:  Londell Moh, MD  Primary Cardiologist:  Croitoru  Chief Complaint  Patient presents with  . Follow-up    Seen by PCP Tuesday (PA) has scabies,      History of Present Illness: Madison Mosley is a 64 y.o. female she was last seen by Dr. Royann Shivers in June 2014.  She has early onset coronary artery disease and underwent 3 successive revascularization procedures in rapid succession in 2001-2002. She has remained asymptomatic since that time and has not required any new interventions. In 2011 she had a normal myocardial perfusion study and was able to exercise for a total of 7-1/2 minutes on the Bruce protocol. She remains asymptomatic.  The major challenge has been identifying a satisfactory LDL cholesterol lowering strategy since she is intolerant to most lipid lowering medications. She tried to use livalo, but with recurrence of intolerable myalgia. She is back on low-dose pravastatin which is the only thing she seems to tolerate. Her last LDL cholesterol was 111. While this is short of our desired target it is a great improvement from baseline levels.  Cholesterol is followed by Dr. Renne Crigler.  October 2015:  total cholesterol 199, glycerides 256, HDL 44, LDL 104  Patient was recently seen by her primary care PA and and there was question whether her rash was a result of Plavix and suggested she come here for evaluation. Patient was seen by her dermatologist yesterday and it turned turned out she has scabies. Treatment has been completed. She has not been seen in 18 months so we needed to see her anyway. We talked about her weight increased from 186 back in June 2014 to 223 pounds today.  The patient currently denies nausea, vomiting, fever, chest pain, shortness of breath beyond baseline, orthopnea, dizziness, PND, cough,  congestion, abdominal pain, hematochezia, melena, lower extremity edema, claudication.  Wt Readings from Last 3 Encounters:  01/05/15 223 lb 11.2 oz (101.47 kg)  04/20/13 186 lb (84.369 kg)     Past Medical History  Diagnosis Date  . Hyperlipidemia   . CAD (coronary artery disease)     stents    Current Outpatient Prescriptions  Medication Sig Dispense Refill  . acetaminophen (TYLENOL) 500 MG tablet Take 500 mg by mouth every 6 (six) hours as needed for pain.    Marland Kitchen aspirin 81 MG tablet Take 81 mg by mouth daily.    . clopidogrel (PLAVIX) 75 MG tablet Take 75 mg by mouth daily.    . Coenzyme Q10 (COQ-10) 200 MG CAPS Take 1 capsule by mouth daily.    Marland Kitchen KRILL OIL 1000 MG CAPS Take 1 capsule by mouth daily.    . metoprolol succinate (TOPROL-XL) 50 MG 24 hr tablet Take 25 mg by mouth daily.    . Multiple Vitamin (MULTIVITAMIN WITH MINERALS) TABS Take 1 tablet by mouth daily.    . pravastatin (PRAVACHOL) 20 MG tablet Take 1 tablet by mouth daily.    Marland Kitchen triamterene-hydrochlorothiazide (MAXZIDE-25) 37.5-25 MG per tablet Take 1 tablet by mouth daily.     No current facility-administered medications for this visit.    Allergies:   No Known Allergies  Social History:  The patient  reports that she has never smoked. She does not have any smokeless tobacco history on file.   Family history:  No family history on file.  ROS:  Please see the history of present illness.  All other systems reviewed and negative.   PHYSICAL EXAM: VS:  BP 144/84 mmHg  Pulse 65  Ht 5\' 8"  (1.727 m)  Wt 223 lb 11.2 oz (101.47 kg)  BMI 34.02 kg/m2 Obese, well developed, in no acute distress HEENT: Pupils are equal round react to light accommodation extraocular movements are intact.  Neck: no JVDNo cervical lymphadenopathy. Cardiac: Regular rate and rhythm without murmurs rubs or gallops. Lungs:  clear to auscultation bilaterally, no wheezing, rhonchi or rales Abd: soft, nontender, positive bowel sounds all  quadrants, no hepatosplenomegaly Ext: no lower extremity edema.  2+ radial and dorsalis pedis pulses. Skin: warm and dry Neuro:  Grossly normal  EKG:  Normal sinus rhythm rate 65 bpm. Nonspecific T wave changes V 71 through 3     ASSESSMENT AND PLAN:  Problem List Items Addressed This Visit    Obesity (BMI 30.0-34.9)    Patient reports she is back on her diet plan. We discussed eating a low carb diet. I also offered a referral to a registered dietitian. She has declined.      Hyperlipidemia    Continue statin. We discussed how to change her eating habits.      HTN (hypertension)    No changes in current therapy.      CAD S/P percutaneous coronary angioplasty    No complaints of angina. Continue aspirin Plavix       Other Visit Diagnoses    Coronary artery disease involving native coronary artery of native heart without angina pectoris    -  Primary    Relevant Orders    EKG 12-Lead

## 2015-01-05 NOTE — Assessment & Plan Note (Signed)
Patient reports she is back on her diet plan. We discussed eating a low carb diet. I also offered a referral to a registered dietitian. She has declined.

## 2015-01-24 ENCOUNTER — Ambulatory Visit: Payer: Self-pay | Admitting: Cardiology

## 2015-02-02 ENCOUNTER — Ambulatory Visit: Payer: Self-pay | Admitting: Physician Assistant

## 2015-02-12 ENCOUNTER — Ambulatory Visit: Payer: Self-pay | Admitting: Cardiovascular Disease

## 2015-06-11 ENCOUNTER — Telehealth: Payer: Self-pay | Admitting: Cardiovascular Disease

## 2015-06-11 NOTE — Telephone Encounter (Signed)
Pt called in stating that she would like to be seen this week because she feels like she is having an allergic reaction to plavix. Please call  Thanks

## 2015-06-11 NOTE — Telephone Encounter (Signed)
Patient has been experiencing intense reaction to arms, legs and lower back for past 4 months with characteristic rash both pinpoint and "splotches" with "very very bad itching" that worsens rash even more when she scratches it. She has been working with her PCP, Dr. Renne Crigler, as follows:  They stopped Maxide x2 weeks without success, then they stopped the Pravastatin without success. Triamcinolone Cream use provides only brief and minimal relief for about 30 minutes. Patient states being in cool water "soothes" the itching however heat worsens it. Patient denies swelling or involvement of mouth/throat at this time. Will route to Dr. Royann Shivers for advisement.

## 2015-06-12 NOTE — Telephone Encounter (Signed)
This is a non-cardiac issue - defer to PCP.  Thanks.  Dr. Rennis Golden (Dr. Salena Saner is out of town)

## 2015-06-13 ENCOUNTER — Telehealth: Payer: Self-pay | Admitting: Cardiovascular Disease

## 2015-06-13 NOTE — Telephone Encounter (Signed)
Can this encounter be closed?

## 2015-06-13 NOTE — Telephone Encounter (Signed)
Pt is calling back to speak with a nurse about the possible allergic reaction with the plavix. Please f/u with her  Thanks

## 2015-06-13 NOTE — Telephone Encounter (Signed)
Spoke to patient regarding an ongoing rash that has occurred for about 4 months. PCP suspects Plavix may be cause. Previous telephone note about this from 7/25 details concerns she has had.  She has seen PCP about this - they have tried stopping/starting some possible culprit medications. She is concerned d/t the increased bleeding episodes she has had from scratching inflamed sites. She denies other unusual bleeding.  She has been compliant w/ plavix & ASA for about 14 years - no problems w/ reaction/SE's until about 4 months ago.   Will route to [place] to seek advice, patient would be extremely grateful for our help in the matter.

## 2015-06-14 NOTE — Telephone Encounter (Signed)
Called patient to check on her status. She states that she spoke to the pharmacist yesterday and she is coming by for some samples that "hopefully will help". Patient verbalized for the phone call to check on her today.  Will close encounter at this time.

## 2015-06-14 NOTE — Telephone Encounter (Signed)
Spoke with patient - the rash is causing her to scratch and bleed.  She is quite uncomfortable.  I am going to switch her from plavix to Effient 10 mg daily.  She is to call in a week and let us know if there is any improvement.  She has an appointment with Nada Boozer on Aug 13, so I gave enough samples to last until that appointment and a decision can then be made about her ongoing anti-platelet therapy.

## 2015-07-03 ENCOUNTER — Ambulatory Visit (INDEPENDENT_AMBULATORY_CARE_PROVIDER_SITE_OTHER): Payer: BC Managed Care – PPO | Admitting: Cardiology

## 2015-07-03 ENCOUNTER — Encounter: Payer: Self-pay | Admitting: Cardiology

## 2015-07-03 VITALS — BP 162/90 | HR 67 | Ht 68.0 in | Wt 223.0 lb

## 2015-07-03 DIAGNOSIS — R21 Rash and other nonspecific skin eruption: Secondary | ICD-10-CM

## 2015-07-03 DIAGNOSIS — I10 Essential (primary) hypertension: Secondary | ICD-10-CM

## 2015-07-03 DIAGNOSIS — E785 Hyperlipidemia, unspecified: Secondary | ICD-10-CM | POA: Diagnosis not present

## 2015-07-03 DIAGNOSIS — I2510 Atherosclerotic heart disease of native coronary artery without angina pectoris: Secondary | ICD-10-CM | POA: Diagnosis not present

## 2015-07-03 MED ORDER — TICAGRELOR 60 MG PO TABS
60.0000 mg | ORAL_TABLET | Freq: Two times a day (BID) | ORAL | Status: DC
Start: 2015-07-03 — End: 2016-03-04

## 2015-07-03 NOTE — Progress Notes (Signed)
Cardiology Office Note   Date:  07/03/2015   ID:  Madison Mosley, DOB [birth year], MRN [id number]  PCP:  Londell Moh, MD  Cardiologist:  Dr.Croitoru    Chief Complaint  Patient presents with  . Coronary Artery Disease    complains of some shortness of breath with exertion; thinks skin issue may be related to plavix (changed to effient ) but has not noticed any difference from changing this medication - tried coming off maxzide and pravastatin as well with no change      History of Present Illness: Madison Mosley is a 64 y.o. female who presents for rash, possible allergy to Plavix  15 days ago changed to effient.  Still with symptoms.  She has early onset coronary artery disease and underwent 3 successive revascularization procedures in rapid succession in 2001-2002. She has remained asymptomatic since that time and has not required any new interventions. In 2011 she had a normal myocardial perfusion study and was able to exercise for a total of 7-1/2 minutes on the Bruce protocol. She remains asymptomatic.  Beginning in Feb 2016 she developed rash initially thought to be scabies but after biopsy she was told most likely meds.  Her PCP has gradually stopped different meds but rash has continued.  Now we stopped the plavix and placed on effient as stated with contiued rash.  Begins as raised areas and turns into petechial rash, + pruritis.    She does not need plavix as her stents were years ago but with her family hx. She is afraid to stop.   No chest pain no SOB.  Dr. Renne Crigler follows her chold.  Most recent oct 2015 noted in previous note.  Her LDL is not at her goal of 70.  But with rash hesitant to increase pravastatin currently.      Past Medical History  Diagnosis Date  . Hyperlipidemia   . CAD (coronary artery disease)     stents    Past Surgical History  Procedure Laterality Date  . Cardiac catheterization  2001    Percutaneous revascularization precedure  with angioplasty to the proximal LAD and first diagonal   . Tonsillectomy  1960  . Cholecystectomy    . Cesarean section    . Partial hysterectomy       Current Outpatient Prescriptions  Medication Sig Dispense Refill  . acetaminophen (TYLENOL) 500 MG tablet Take 500 mg by mouth every 6 (six) hours as needed for pain.    Marland Kitchen aspirin 81 MG tablet Take 81 mg by mouth daily.    . Coenzyme Q10 (COQ-10) 200 MG CAPS Take 1 capsule by mouth daily.    Marland Kitchen KRILL OIL 1000 MG CAPS Take 1 capsule by mouth daily.    . metoprolol succinate (TOPROL-XL) 50 MG 24 hr tablet Take 25 mg by mouth daily.    . Multiple Vitamin (MULTIVITAMIN WITH MINERALS) TABS Take 1 tablet by mouth daily.    . pravastatin (PRAVACHOL) 20 MG tablet Take 1 tablet by mouth daily.    . ticagrelor (BRILINTA) 60 MG TABS tablet Take 1 tablet (60 mg total) by mouth 2 (two) times daily. 60 tablet 6  . triamcinolone cream (KENALOG) 0.1 % as needed.    . triamterene-hydrochlorothiazide (MAXZIDE-25) 37.5-25 MG per tablet Take 1 tablet by mouth daily.    Marland Kitchen VAGIFEM 10 MCG TABS vaginal tablet once a week.     No current facility-administered medications for this visit.    Allergies:   Naproxen  Social History:  The patient  reports that she has never smoked. She does not have any smokeless tobacco history on file. She reports that she does not drink alcohol or use illicit drugs.   Family History:  The patient's family history includes COPD in her mother; Diabetes in her mother; Heart attack in her father, maternal grandfather, paternal grandfather, and sister; Heart failure in her mother; Other in her mother; Stroke in her paternal grandfather.  Brother eiht CAD, sister with MI  ROS:  General:no colds or fevers, no weight changes from Feb Skin:+rashes no ulcers HEENT:no blurred vision, no congestion CV:see HPI PUL:see HPI GI:no diarrhea constipation or melena, no indigestion GU:no hematuria, no dysuria MS:no joint pain, no  claudication Neuro:no syncope, no lightheadedness Endo:no diabetes, no thyroid disease  Wt Readings from Last 3 Encounters:  07/03/15 223 lb (101.152 kg)  01/05/15 223 lb 11.2 oz (101.47 kg)  04/20/13 186 lb (84.369 kg)     PHYSICAL EXAM: VS:  BP 162/90 mmHg  Pulse 67  Ht 5\' 8"  (1.727 m)  Wt 223 lb (101.152 kg)  BMI 33.91 kg/m2 , BMI Body mass index is 33.91 kg/(m^2). General:Pleasant affect, NAD Skin:Warm and dry, brisk capillary refill, petechial rash HEENT:normocephalic, sclera clear, mucus membranes moist Neck:supple, no JVD, no bruits  Heart:S1S2 RRR without murmur, gallup, rub or click Lungs:clear without rales, rhonchi, or wheezes MWU:XLKG, non tender, + BS, do not palpate liver spleen or masses Ext:no lower ext edema, 2+ pedal pulses, 2+ radial pulses Neuro:alert and oriented X 3, MAE, follows commands, + facial symmetry    EKG:  EKG is ordered today. The ekg ordered today demonstrates SR non specific ST and T wave changes but same as last EKG 12/2014   Recent Labs: No results found for requested labs within last 365 days.    Lipid Panel No results found for: CHOL, TRIG, HDL, CHOLHDL, VLDL, LDLCALC, LDLDIRECT     Other studies Reviewed: Additional studies/ records that were reviewed today include: previous notes, last cath.   ASSESSMENT AND PLAN:  1.  Rash- not scabies, no dx with biopsy and holding meds one at a time without changes.  She could stop antiplatelet of plavix and effient but she is worried that the stent may occlude.  I discussed with Dr. Ruby Cola and he believes if she stays with antiplatelet then Brilinta 60 mg BID would be the best choice.  We gave samples today and script.  If symptoms continue she will see Dr. Judie Petit. Croitoru back in 3 months for further discussion.    2 CAD PCI with stent in 2003.  No chest pain  3. Hyperlipidemia goal LDL 70  4. HTN elevated though on recheck improved to 148/82.   Current medicines are reviewed with  the patient today.  The patient Has no concerns regarding medicines.  The following changes have been made:  See above Labs/ tests ordered today include:see above  Disposition:   FU:  see above  Nyoka Lint, NP  07/03/2015 10:22 AM    [facility] [address] [place], [place], [place]  [address]/ [address] [place], [place] Phone: [phone]; Fax: [phone]  [phone]

## 2015-07-03 NOTE — Patient Instructions (Addendum)
Medication Change 1. STOP Effient/Plavix 2. START Brilinta  twice daily (6 boxes of samples provided)  Your physician recommends that you schedule a follow-up appointment in 3 months with Dr. Royann Shivers

## 2015-07-18 ENCOUNTER — Encounter: Payer: Self-pay | Admitting: Cardiovascular Disease

## 2015-07-25 ENCOUNTER — Encounter: Payer: Self-pay | Admitting: Cardiovascular Disease

## 2015-09-25 ENCOUNTER — Telehealth: Payer: Self-pay | Admitting: Cardiovascular Disease

## 2015-09-25 NOTE — Telephone Encounter (Signed)
[facility] appointment on 10/05/15 with Dr Royann Shiversroitoru.  [facility] for Dr Croitoru's schedule on 10/05/15. lp

## 2015-10-05 ENCOUNTER — Ambulatory Visit (INDEPENDENT_AMBULATORY_CARE_PROVIDER_SITE_OTHER): Payer: Medicare Other | Admitting: Cardiovascular Disease

## 2015-10-05 ENCOUNTER — Encounter: Payer: Self-pay | Admitting: Cardiovascular Disease

## 2015-10-05 VITALS — BP 151/72 | HR 68 | Resp 16 | Ht 68.25 in | Wt 229.5 lb

## 2015-10-05 DIAGNOSIS — E669 Obesity, unspecified: Secondary | ICD-10-CM

## 2015-10-05 DIAGNOSIS — I10 Essential (primary) hypertension: Secondary | ICD-10-CM

## 2015-10-05 DIAGNOSIS — E785 Hyperlipidemia, unspecified: Secondary | ICD-10-CM | POA: Diagnosis not present

## 2015-10-05 NOTE — Patient Instructions (Signed)
Your physician has recommended you make the following change in your medication: STOP asiprin.   Your physician wants you to follow-up in: 1 year or sooner if needed with Dr. Royann Shiversroitoru. You will receive a reminder letter in the mail two months in advance. If you don't receive a letter, please call our office to schedule the follow-up appointment.   If you need a refill on your cardiac medications before your next appointment, please call your pharmacy.

## 2015-10-05 NOTE — Progress Notes (Signed)
Patient ID: Madison Mosley, female   DOB: 01-11-1951, 64 y.o.   MRN: [id number]     Cardiology Office Note   Date:  10/06/2015   ID:  Madison Mosley, DOB [birth year], MRN [id number]  PCP:  Londell Moh, MD  Cardiologist:   Thurmon Fair, MD   Chief Complaint  Patient presents with  . Follow-up      History of Present Illness: Madison Mosley is a 64 y.o. female who presents for  All of for coronary artery disease. She had premature onset CAD and underwent 3 separate revascularization procedures in 2001-2002, but has not required any  Intervention since that time. She had a normal nuclear stress test in 2011. She remains asymptomatic. Findings a effective regimen to reduce cholesterol levels has been a challenge. She recently saw Dr. Renne Crigler and his note mentions probably won't. Madison Mosley tells me that she is to be enrolled in a study for new cholesterol drug.  Her pruritic rash has resolved, but she has a lot of problems with petechiae especially on her forearms. She has not had overt , serious bleeding.  The patient specifically denies any chest pain at rest or exertion, dyspnea at rest or with exertion, orthopnea, paroxysmal nocturnal dyspnea, syncope, palpitations, focal neurological deficits, intermittent claudication, lower extremity edema, unexplained weight gain, cough, hemoptysis or wheezing.     Past Medical History  Diagnosis Date  . Hyperlipidemia   . CAD (coronary artery disease)     stents    Past Surgical History  Procedure Laterality Date  . Cardiac catheterization  2001    Percutaneous revascularization precedure with angioplasty to the proximal LAD and first diagonal   . Tonsillectomy  1960  . Cholecystectomy    . Cesarean section    . Partial hysterectomy       Current Outpatient Prescriptions  Medication Sig Dispense Refill  . acetaminophen (TYLENOL) 500 MG tablet Take 500 mg by mouth every 6 (six) hours as needed for pain.    .  metoprolol succinate (TOPROL-XL) 50 MG 24 hr tablet Take 25 mg by mouth daily.    . Multiple Vitamin (MULTIVITAMIN WITH MINERALS) TABS Take 1 tablet by mouth daily.    . pravastatin (PRAVACHOL) 20 MG tablet Take 1 tablet by mouth daily.    . ticagrelor (BRILINTA) 60 MG TABS tablet Take 1 tablet (60 mg total) by mouth 2 (two) times daily. 60 tablet 6  . triamcinolone cream (KENALOG) 0.1 % as needed.    . triamterene-hydrochlorothiazide (MAXZIDE-25) 37.5-25 MG per tablet Take 1 tablet by mouth daily.     No current facility-administered medications for this visit.    Allergies:   Naproxen    Social History:  The patient  reports that she has never smoked. She does not have any smokeless tobacco history on file. She reports that she does not drink alcohol or use illicit drugs.   Family History:  The patient's family history includes COPD in her mother; Diabetes in her mother; Heart attack in her father, maternal grandfather, paternal grandfather, and sister; Heart failure in her mother; Other in her mother; Stroke in her paternal grandfather.    ROS:  Please see the history of present illness.    Otherwise, review of systems positive for none.   All other systems are reviewed and negative.    PHYSICAL EXAM: VS:  BP 151/72 mmHg  Pulse 68  Resp 16  Ht 5' 8.25" (1.734 m)  Wt 229 lb 8 oz (104.101 kg)  BMI 34.62 kg/m2 , BMI Body mass index is 34.62 kg/(m^2).  General: Alert, oriented x3, no distress Head: no evidence of trauma, PERRL, EOMI, no exophtalmos or lid lag, no myxedema, no xanthelasma; normal ears, nose and oropharynx Neck: normal jugular venous pulsations and no hepatojugular reflux; brisk carotid pulses without delay and no carotid bruits Chest: clear to auscultation, no signs of consolidation by percussion or palpation, normal fremitus, symmetrical and full respiratory excursions Cardiovascular: normal position and quality of the apical impulse, regular rhythm, normal first  and second heart sounds, no murmurs, rubs or gallops Abdomen: no tenderness or distention, no masses by palpation, no abnormal pulsatility or arterial bruits, normal bowel sounds, no hepatosplenomegaly Extremities: no clubbing, cyanosis or edema; 2+ radial, ulnar and brachial pulses bilaterally; 2+ right femoral, posterior tibial and dorsalis pedis pulses; 2+ left femoral, posterior tibial and dorsalis pedis pulses; no subclavian or femoral bruits Neurological: grossly nonfocal Psych: euthymic mood, full affect  Prominent petechiae and a few ecchymoses scattered over her arms and forearms  EKG:  EKG is not ordered today.   Recent Labs: No results found for requested labs within last 365 days.    Lipid Panel No results found for: CHOL, TRIG, HDL, CHOLHDL, VLDL, LDLCALC, LDLDIRECT    Wt Readings from Last 3 Encounters:  10/05/15 229 lb 8 oz (104.101 kg)  07/03/15 223 lb (101.152 kg)  01/05/15 223 lb 11.2 oz (101.47 kg)      Other studies Reviewed: Additional studies/ records that were reviewed today include:  Records from November 8 primary care physician visit..   ASSESSMENT AND PLAN:  1.  CAD, asymptomatic 2.  Essential hypertension. Blood pressure slightly high today but just a few days ago her blood pressure was 128/80 in Dr. Carolee RotaPharr's office. 3.  Hyperlipidemia, enrolled in a clinical trial 4.  Petechiae. Will stop aspirin and continue Brilinta only.    Current medicines are reviewed at length with the patient today.  The patient does not have concerns regarding medicines.  The following changes have been made:  Stop ASA  Labs/ tests ordered today include:  No orders of the defined types were placed in this encounter.    Patient Instructions  Your physician has recommended you make the following change in your medication: STOP asiprin.   Your physician wants you to follow-up in: 1 year or sooner if needed with Dr. Royann Shiversroitoru. You will receive a reminder letter in the  mail two months in advance. If you don't receive a letter, please call our office to schedule the follow-up appointment.   If you need a refill on your cardiac medications before your next appointment, please call your pharmacy.     Joie BimlerSigned, Machele Deihl, MD  10/06/2015 10:39 AM    Thurmon FairMihai Decker Cogdell, MD, [facility] [phone] office [phone] pager

## 2015-10-06 ENCOUNTER — Encounter: Payer: Self-pay | Admitting: Cardiovascular Disease

## 2016-03-04 ENCOUNTER — Other Ambulatory Visit: Payer: Self-pay | Admitting: Cardiology

## 2016-03-07 ENCOUNTER — Other Ambulatory Visit: Payer: Self-pay | Admitting: Cardiology

## 2016-03-07 NOTE — Telephone Encounter (Signed)
Rx(s) sent to pharmacy electronically.  

## 2016-05-28 DIAGNOSIS — B86 Scabies: Secondary | ICD-10-CM | POA: Diagnosis not present

## 2016-06-26 DIAGNOSIS — B86 Scabies: Secondary | ICD-10-CM | POA: Diagnosis not present

## 2016-06-27 ENCOUNTER — Telehealth: Payer: Self-pay | Admitting: Cardiovascular Disease

## 2016-06-27 NOTE — Telephone Encounter (Signed)
New message       Patient calling the office for samples of medication:   1.  What medication and dosage are you requesting samples for? brilinta   2.  Are you currently out of this medication? (have a few pills left) Cannot get refill until 8-18.  Pt will be on vacation and will not have access to a pharmacy.

## 2016-06-27 NOTE — Telephone Encounter (Signed)
Medication samples have been provided to the patient.  Drug name: Brilinta 60mg   Qty: 3 boxes (14 tablets each)  LOT: [id number]  Exp.Date: 7-18  Samples left at front desk for patient pick-up. Patient notified.  Alonzo Loving A Jordyn Doane 10:24 AM 06/27/2016

## 2016-07-28 DIAGNOSIS — L309 Dermatitis, unspecified: Secondary | ICD-10-CM | POA: Diagnosis not present

## 2016-07-28 DIAGNOSIS — L810 Postinflammatory hyperpigmentation: Secondary | ICD-10-CM | POA: Diagnosis not present

## 2016-08-25 DIAGNOSIS — Z23 Encounter for immunization: Secondary | ICD-10-CM | POA: Diagnosis not present

## 2016-09-23 DIAGNOSIS — N390 Urinary tract infection, site not specified: Secondary | ICD-10-CM | POA: Diagnosis not present

## 2016-09-23 DIAGNOSIS — R300 Dysuria: Secondary | ICD-10-CM | POA: Diagnosis not present

## 2016-10-03 DIAGNOSIS — E7800 Pure hypercholesterolemia, unspecified: Secondary | ICD-10-CM | POA: Diagnosis not present

## 2016-10-03 DIAGNOSIS — Z0000 Encounter for general adult medical examination without abnormal findings: Secondary | ICD-10-CM | POA: Diagnosis not present

## 2016-10-03 DIAGNOSIS — K219 Gastro-esophageal reflux disease without esophagitis: Secondary | ICD-10-CM | POA: Diagnosis not present

## 2016-10-03 DIAGNOSIS — I10 Essential (primary) hypertension: Secondary | ICD-10-CM | POA: Diagnosis not present

## 2016-10-14 DIAGNOSIS — I8390 Asymptomatic varicose veins of unspecified lower extremity: Secondary | ICD-10-CM | POA: Diagnosis not present

## 2016-10-14 DIAGNOSIS — Z833 Family history of diabetes mellitus: Secondary | ICD-10-CM | POA: Diagnosis not present

## 2016-10-14 DIAGNOSIS — Z0001 Encounter for general adult medical examination with abnormal findings: Secondary | ICD-10-CM | POA: Diagnosis not present

## 2016-10-14 DIAGNOSIS — Z9861 Coronary angioplasty status: Secondary | ICD-10-CM | POA: Diagnosis not present

## 2016-10-14 DIAGNOSIS — E876 Hypokalemia: Secondary | ICD-10-CM | POA: Diagnosis not present

## 2016-10-14 DIAGNOSIS — R3129 Other microscopic hematuria: Secondary | ICD-10-CM | POA: Diagnosis not present

## 2016-10-16 DIAGNOSIS — I2510 Atherosclerotic heart disease of native coronary artery without angina pectoris: Secondary | ICD-10-CM | POA: Diagnosis not present

## 2016-10-16 DIAGNOSIS — Y9301 Activity, walking, marching and hiking: Secondary | ICD-10-CM | POA: Diagnosis not present

## 2016-10-16 DIAGNOSIS — Z955 Presence of coronary angioplasty implant and graft: Secondary | ICD-10-CM | POA: Diagnosis not present

## 2016-10-16 DIAGNOSIS — S0990XA Unspecified injury of head, initial encounter: Secondary | ICD-10-CM | POA: Diagnosis not present

## 2016-10-16 DIAGNOSIS — Y92480 Sidewalk as the place of occurrence of the external cause: Secondary | ICD-10-CM | POA: Diagnosis not present

## 2016-10-16 DIAGNOSIS — S60512A Abrasion of left hand, initial encounter: Secondary | ICD-10-CM | POA: Diagnosis not present

## 2016-10-16 DIAGNOSIS — W19XXXA Unspecified fall, initial encounter: Secondary | ICD-10-CM | POA: Diagnosis not present

## 2016-10-16 DIAGNOSIS — W010XXA Fall on same level from slipping, tripping and stumbling without subsequent striking against object, initial encounter: Secondary | ICD-10-CM | POA: Diagnosis not present

## 2016-10-16 DIAGNOSIS — S0093XA Contusion of unspecified part of head, initial encounter: Secondary | ICD-10-CM | POA: Diagnosis not present

## 2016-10-16 DIAGNOSIS — S0031XA Abrasion of nose, initial encounter: Secondary | ICD-10-CM | POA: Diagnosis not present

## 2016-10-16 DIAGNOSIS — Z7902 Long term (current) use of antithrombotics/antiplatelets: Secondary | ICD-10-CM | POA: Diagnosis not present

## 2016-10-16 DIAGNOSIS — S60511A Abrasion of right hand, initial encounter: Secondary | ICD-10-CM | POA: Diagnosis not present

## 2016-10-20 DIAGNOSIS — H524 Presbyopia: Secondary | ICD-10-CM | POA: Diagnosis not present

## 2016-10-20 DIAGNOSIS — S0012XA Contusion of left eyelid and periocular area, initial encounter: Secondary | ICD-10-CM | POA: Diagnosis not present

## 2016-10-20 DIAGNOSIS — H5203 Hypermetropia, bilateral: Secondary | ICD-10-CM | POA: Diagnosis not present

## 2016-10-20 DIAGNOSIS — S0990XD Unspecified injury of head, subsequent encounter: Secondary | ICD-10-CM | POA: Diagnosis not present

## 2016-10-20 DIAGNOSIS — I10 Essential (primary) hypertension: Secondary | ICD-10-CM | POA: Diagnosis not present

## 2016-10-20 DIAGNOSIS — E876 Hypokalemia: Secondary | ICD-10-CM | POA: Diagnosis not present

## 2016-10-20 DIAGNOSIS — S0011XA Contusion of right eyelid and periocular area, initial encounter: Secondary | ICD-10-CM | POA: Diagnosis not present

## 2016-10-21 ENCOUNTER — Other Ambulatory Visit: Payer: Self-pay | Admitting: Cardiovascular Disease

## 2016-10-21 NOTE — Telephone Encounter (Signed)
Rx has been sent to the pharmacy electronically. ° °

## 2016-10-23 DIAGNOSIS — E2839 Other primary ovarian failure: Secondary | ICD-10-CM | POA: Diagnosis not present

## 2016-10-28 DIAGNOSIS — E782 Mixed hyperlipidemia: Secondary | ICD-10-CM | POA: Diagnosis not present

## 2016-10-28 DIAGNOSIS — I2510 Atherosclerotic heart disease of native coronary artery without angina pectoris: Secondary | ICD-10-CM | POA: Diagnosis not present

## 2016-10-28 DIAGNOSIS — Z9861 Coronary angioplasty status: Secondary | ICD-10-CM | POA: Diagnosis not present

## 2016-10-28 DIAGNOSIS — I10 Essential (primary) hypertension: Secondary | ICD-10-CM | POA: Diagnosis not present

## 2016-10-30 ENCOUNTER — Ambulatory Visit (INDEPENDENT_AMBULATORY_CARE_PROVIDER_SITE_OTHER): Payer: PPO | Admitting: Cardiovascular Disease

## 2016-10-30 ENCOUNTER — Encounter: Payer: Self-pay | Admitting: Cardiovascular Disease

## 2016-10-30 VITALS — BP 136/76 | HR 67 | Ht 68.0 in | Wt 224.0 lb

## 2016-10-30 DIAGNOSIS — I10 Essential (primary) hypertension: Secondary | ICD-10-CM

## 2016-10-30 DIAGNOSIS — Z9861 Coronary angioplasty status: Secondary | ICD-10-CM

## 2016-10-30 DIAGNOSIS — I2510 Atherosclerotic heart disease of native coronary artery without angina pectoris: Secondary | ICD-10-CM

## 2016-10-30 DIAGNOSIS — E7800 Pure hypercholesterolemia, unspecified: Secondary | ICD-10-CM

## 2016-10-30 MED ORDER — ASPIRIN EC 81 MG PO TBEC: 81.0000 mg | DELAYED_RELEASE_TABLET | Freq: Every day | ORAL | 3 refills | Status: AC

## 2016-10-30 NOTE — Progress Notes (Signed)
Cardiology Office Note    Date:  10/30/2016   ID:  Madison Mosley, DOB [birth year], MRN [id number]  PCP:  Londell MohPHARR,WALTER DAVIDSON, MD  Cardiologist:   Thurmon FairMihai Demarrio Menges, MD   Chief Complaint  Patient presents with  . Follow-up    History of Present Illness:  Madison Mosley is a 65 y.o. female with early onset coronary artery disease (3 separate stent procedures 2001-2002) without need for revascularization in the last 15 years. Her last functional study was a normal nuclear stress test in 2011. She has significant hyperlipidemia but unfortunately has been intolerant to statins, Zetia and WelChol. She was enrolled in a clinical trial of a new cholesterol-lowering drug but developed a rash.  A year ago we stopped aspirin and continued Brilinta in the low, maintenance 60 mg twice daily dose. Despite this she continues to have very easy bruising and had substantial [place].  She has not had any problems of angina pectoris or shortness of breath and also denies edema, claudication, palpitations or syncope.  Her most recent lipid profile shows a total cholesterol 226, LDL 122, triglycerides 260, HDL 52 (October 03, 2016). Her primary care provider's office is trying to see if she is able to receive Repatha.  Past Medical History:  Diagnosis Date  . CAD (coronary artery disease)    stents  . Hyperlipidemia     Past Surgical History:  Procedure Laterality Date  . CARDIAC CATHETERIZATION  2001   Percutaneous revascularization precedure with angioplasty to the proximal LAD and first diagonal   . CESAREAN SECTION    . CHOLECYSTECTOMY    . PARTIAL HYSTERECTOMY    . TONSILLECTOMY  1960    Current Medications: Outpatient Medications Prior to Visit  Medication Sig Dispense Refill  . acetaminophen (TYLENOL) 500 MG tablet Take 500 mg by mouth every 6 (six) hours as needed for pain.    . metoprolol succinate (TOPROL-XL) 50 MG 24 hr tablet Take 25 mg by mouth  daily.    . Multiple Vitamin (MULTIVITAMIN WITH MINERALS) TABS Take 1 tablet by mouth daily.    . pravastatin (PRAVACHOL) 20 MG tablet Take 1 tablet by mouth daily.    Marland Kitchen. triamcinolone cream (KENALOG) 0.1 % as needed.    . triamterene-hydrochlorothiazide (MAXZIDE-25) 37.5-25 MG per tablet Take 1 tablet by mouth daily.    Marland Kitchen. BRILINTA 60 MG TABS tablet TAKE 1 TABLET BY MOUTH TWICE DAILY 60 tablet 0  . BRILINTA 60 MG TABS tablet TAKE 1 TABLET BY MOUTH TWICE DAILY (Patient not taking: Reported on 10/30/2016) 60 tablet 7   No facility-administered medications prior to visit.      Allergies:   Naproxen   Social History   Social History  . Marital status: Married    Spouse name: N/A  . Number of children: N/A  . Years of education: N/A   Social History Main Topics  . Smoking status: Never Smoker  . Smokeless tobacco: Not on file  . Alcohol use No  . Drug use: No  . Sexual activity: Not on file   Other Topics Concern  . Not on file   Social History Narrative  . No narrative on file     Family History:  The patient's family history includes COPD in her mother; Diabetes in her mother; Heart attack in her father, maternal grandfather, paternal grandfather, and sister; Heart failure in her mother; Other in her mother; Stroke in her paternal grandfather.  ROS:   Please see the history of present illness.    ROS All other systems reviewed and are negative.   PHYSICAL EXAM:   VS:  BP 136/76 (Cuff Size: Large)   Pulse 67   Ht 5\' 8"  (1.727 m)   Wt 224 lb (101.6 kg)   BMI 34.06 kg/m    GEN: Well nourished, well developed, in no acute distress  HEENT: normal  Neck: no JVD, carotid bruits, or masses Cardiac: RRR; no murmurs, rubs, or gallops,no edema  Respiratory:  clear to auscultation bilaterally, normal work of breathing GI: soft, nontender, nondistended, + BS MS: no deformity or atrophy  Skin: warm and dry, no rash Neuro:  Alert and Oriented x 3, Strength and sensation are  intact Psych: euthymic mood, full affect  Wt Readings from Last 3 Encounters:  10/30/16 224 lb (101.6 kg)  10/05/15 229 lb 8 oz (104.1 kg)  07/03/15 223 lb (101.2 kg)      Studies/Labs Reviewed:   EKG:  EKG is ordered today.  The ekg ordered today demonstrates Sinus rhythm, minimal nonspecific ST segment changes, QTC 409 ms.  Recent Labs: Creatinine 0.8, glucose 125   Lipid Panel total cholesterol 226, LDL 122, triglycerides 260, HDL 52 (October 03, 2016)   ASSESSMENT:    1. CAD S/P percutaneous coronary angioplasty   2. Essential hypertension   3. Pure hypercholesterolemia      PLAN:  In order of problems listed above:  1. CAD: Asymptomatic. Focus on risk factor modification. She has not had angina or unstable events in over 15 years. She is having worsening bleeding problems with the antiplatelet regimen. We'll switch to aspirin 81 mg daily. 2. HTN: Well-controlled, typical blood pressure is 120/70. 3. HLP: She fully meets criteria for PCS K9 inhibitors. Hopefully we will find an affordable way for her to get these.    Medication Adjustments/Labs and Tests Ordered: Current medicines are reviewed at length with the patient today.  Concerns regarding medicines are outlined above.  Medication changes, Labs and Tests ordered today are listed in the Patient Instructions below. Patient Instructions  Dr Royann Shiversroitoru has recommended making the following medication changes: 1. STOP Brilinta 2. START Aspirin 81 mg - take 1 tablet by mouth once daily  Your physician recommends that you schedule a follow-up appointment in 12 months. You will receive a reminder letter in the mail two months in advance. If you don't receive a letter, please call our office to schedule the follow-up appointment.  If you need a refill on your cardiac medications before your next appointment, please call your pharmacy.    Signed, Thurmon FairMihai Aking Klabunde, MD  10/30/2016 12:54 PM    [facility]  [facility] [address], [place], [place]  [address] Phone: [phone]; Fax: [phone]

## 2016-10-30 NOTE — Patient Instructions (Signed)
Dr Royann Shiversroitoru has recommended making the following medication changes: 1. STOP Brilinta 2. START Aspirin 81 mg - take 1 tablet by mouth once daily  Your physician recommends that you schedule a follow-up appointment in 12 months. You will receive a reminder letter in the mail two months in advance. If you don't receive a letter, please call our office to schedule the follow-up appointment.  If you need a refill on your cardiac medications before your next appointment, please call your pharmacy.

## 2016-11-05 DIAGNOSIS — Z1212 Encounter for screening for malignant neoplasm of rectum: Secondary | ICD-10-CM | POA: Diagnosis not present

## 2016-11-05 DIAGNOSIS — Z1211 Encounter for screening for malignant neoplasm of colon: Secondary | ICD-10-CM | POA: Diagnosis not present

## 2016-11-18 DIAGNOSIS — Z12 Encounter for screening for malignant neoplasms: Secondary | ICD-10-CM | POA: Diagnosis not present

## 2016-11-18 DIAGNOSIS — Z1231 Encounter for screening mammogram for malignant neoplasm of breast: Secondary | ICD-10-CM | POA: Diagnosis not present

## 2016-11-18 DIAGNOSIS — Z01419 Encounter for gynecological examination (general) (routine) without abnormal findings: Secondary | ICD-10-CM | POA: Diagnosis not present

## 2017-01-13 DIAGNOSIS — S99912A Unspecified injury of left ankle, initial encounter: Secondary | ICD-10-CM | POA: Diagnosis not present

## 2017-01-13 DIAGNOSIS — M25572 Pain in left ankle and joints of left foot: Secondary | ICD-10-CM | POA: Diagnosis not present

## 2017-01-13 DIAGNOSIS — Y92481 Parking lot as the place of occurrence of the external cause: Secondary | ICD-10-CM | POA: Diagnosis not present

## 2017-01-13 DIAGNOSIS — I10 Essential (primary) hypertension: Secondary | ICD-10-CM | POA: Diagnosis not present

## 2017-01-13 DIAGNOSIS — W000XXA Fall on same level due to ice and snow, initial encounter: Secondary | ICD-10-CM | POA: Diagnosis not present

## 2017-01-13 DIAGNOSIS — S8262XA Displaced fracture of lateral malleolus of left fibula, initial encounter for closed fracture: Secondary | ICD-10-CM | POA: Diagnosis not present

## 2017-01-16 ENCOUNTER — Encounter (INDEPENDENT_AMBULATORY_CARE_PROVIDER_SITE_OTHER): Payer: Self-pay | Admitting: Orthopedic Surgery

## 2017-01-16 ENCOUNTER — Ambulatory Visit (INDEPENDENT_AMBULATORY_CARE_PROVIDER_SITE_OTHER): Payer: PPO | Admitting: Orthopedic Surgery

## 2017-01-16 VITALS — Ht 68.0 in | Wt 224.0 lb

## 2017-01-16 DIAGNOSIS — S93412A Sprain of calcaneofibular ligament of left ankle, initial encounter: Secondary | ICD-10-CM | POA: Diagnosis not present

## 2017-01-16 NOTE — Progress Notes (Signed)
Office Visit Note   Patient: Madison Mosley           Date of Birth: 27-Apr-1951           MRN: [id number] Visit Date: 01/16/2017              Requested by: Merri Brunette, MD [address] [place], [place] [address] PCP: Londell Moh, MD  Chief Complaint  Patient presents with  . Left Ankle - Pain    DOI 01/13/17 slipped and fell on ice while visiting  [facility]     HPI: Patient was in [place] on 01/13/17 and slipped and fell with right leg in front and the left leg twisted behind her. She had x rays while there was placed in a fracture boot and is partial wheigh bearing with crutches. Patient complains of lateral side ankle pain the ankle is swollen and bruised. The skin is intact without blistering. Rodena Medin, RMA    Assessment & Plan: Visit Diagnoses:  1. Sprain of calcaneofibular ligament of left ankle, initial encounter     Plan: She'll continue with the fracture boot weightbearing as tolerated recommended medical compression stockings follow-up in 2 weeks with 3 view radiographs of the left ankle.  Follow-Up Instructions: Return in about 2 weeks (around 01/30/2017).   Ortho Exam Examination patient is alert oriented no adenopathy well-dressed normal affect and was torn for she is him going with crutches. Examination she is a good dorsalis pedis pulse she has venous stasis changes in the left leg but no open ulcers. She is tender to palpation over the lateral ankle ligaments as well as over the deltoid ligament. Anterior drawer is stable. Review of her outside radiographs from [place] [facility] shows chronic degenerative changes of the left ankle with osteophytic bone spurs and calcifications to the deltoid ligament as well as subcondylar cysts. Her joint space is congruent. No evidence of acute fracture. ROS: Complete review of systems negative except as noted in the H&P. Imaging: No results found.  Labs: No results found for: HGBA1C,  ESRSEDRATE, CRP, LABURIC, REPTSTATUS, GRAMSTAIN, CULT, LABORGA  Orders:  No orders of the defined types were placed in this encounter.  No orders of the defined types were placed in this encounter.    Procedures: No procedures performed  Clinical Data: No additional findings.  Subjective: Review of Systems  Objective: Vital Signs: Ht 5\' 8"  (1.727 m)   Wt 224 lb (101.6 kg)   BMI 34.06 kg/m   Specialty Comments:  No specialty comments available.  PMFS History: Patient Active Problem List   Diagnosis Date Noted  . Sprain of calcaneofibular ligament of left ankle 01/16/2017  . Obesity (BMI 30.0-34.9) 01/05/2015  . CAD S/P percutaneous coronary angioplasty 04/21/2013  . Hyperlipidemia 04/21/2013  . HTN (hypertension) 04/21/2013   Past Medical History:  Diagnosis Date  . CAD (coronary artery disease)    stents  . Hyperlipidemia     Family History  Problem Relation Age of Onset  . COPD Mother   . Heart failure Mother   . Diabetes Mother   . Other Mother     mitral valve leakage  . Heart attack Father     multiple heart attacks  . Heart attack Sister   . Heart attack Maternal Grandfather   . Heart attack Paternal Grandfather     multiple hearts attacks  . Stroke Paternal Grandfather     Past Surgical History:  Procedure Laterality Date  . CARDIAC  CATHETERIZATION  2001   Percutaneous revascularization precedure with angioplasty to the proximal LAD and first diagonal   . CESAREAN SECTION    . CHOLECYSTECTOMY    . PARTIAL HYSTERECTOMY    . TONSILLECTOMY  1960   Social History   Occupational History  . Not on file.   Social History Main Topics  . Smoking status: Never Smoker  . Smokeless tobacco: Never Used  . Alcohol use No  . Drug use: No  . Sexual activity: Not on file

## 2017-01-18 ENCOUNTER — Ambulatory Visit (INDEPENDENT_AMBULATORY_CARE_PROVIDER_SITE_OTHER): Payer: PPO

## 2017-01-18 ENCOUNTER — Ambulatory Visit (HOSPITAL_COMMUNITY)
Admission: EM | Admit: 2017-01-18 | Discharge: 2017-01-18 | Disposition: A | Discharge status: Home / Self Care | Payer: PPO | Attending: Family Medicine | Admitting: Family Medicine

## 2017-01-18 ENCOUNTER — Encounter (HOSPITAL_COMMUNITY): Payer: Self-pay | Admitting: Emergency Medicine

## 2017-01-18 DIAGNOSIS — J181 Lobar pneumonia, unspecified organism: Secondary | ICD-10-CM | POA: Diagnosis not present

## 2017-01-18 DIAGNOSIS — J189 Pneumonia, unspecified organism: Secondary | ICD-10-CM

## 2017-01-18 DIAGNOSIS — R05 Cough: Secondary | ICD-10-CM | POA: Diagnosis not present

## 2017-01-18 MED ORDER — AZITHROMYCIN 250 MG PO TABS: 250.0000 mg | ORAL_TABLET | Freq: Once | ORAL | 0 refills | Status: AC

## 2017-01-18 MED ORDER — AZITHROMYCIN 250 MG PO TABS: ORAL_TABLET | ORAL | 0 refills | Status: DC

## 2017-01-18 MED ORDER — HYDROCOD POLST-CPM POLST ER 10-8 MG/5ML PO SUER: ORAL | 0 refills | Status: DC

## 2017-01-18 MED ORDER — CEFDINIR 300 MG PO CAPS: 300.0000 mg | ORAL_CAPSULE | Freq: Two times a day (BID) | ORAL | 0 refills | Status: DC

## 2017-01-18 MED ORDER — ACETAMINOPHEN 325 MG PO TABS
ORAL_TABLET | ORAL | Status: AC
  Filled 2017-01-18: qty 2

## 2017-01-18 MED ORDER — ACETAMINOPHEN 325 MG PO TABS
650.0000 mg | ORAL_TABLET | Freq: Once | ORAL | Status: AC
  Administered 2017-01-18: 650 mg via ORAL

## 2017-01-18 NOTE — Discharge Instructions (Signed)
Your chest x-ray indicates a probable pneumonia in your left lower lung. You are being treated with antibiotics. You are also given a cough medicine. Be sure to take this medicine correctly and do not take more than directed. This medicine can cause drowsiness and lethargy. He may take Tylenol or ibuprofen as needed for discomfort or fever. Be sure to take deep breaths every hour while you are overweight. Follow-up with your primary care doctor later this week as needed. He definitely will need to follow-up and about 3 weeks to receive a repeat chest x-ray to make sure the pneumonia has resolved.

## 2017-01-18 NOTE — ED Triage Notes (Signed)
[facility] a complaint of a productive cough for 3 days with a fever that started yesterday.

## 2017-01-18 NOTE — ED Provider Notes (Signed)
CSN: [id number]     Arrival date & time 01/18/17  1645 History   First MD Initiated Contact with Patient 01/18/17 1814     Chief Complaint  Patient presents with  . Cough   (Consider location/radiation/quality/duration/timing/severity/associated sxs/prior Treatment) 66 year old female dated 2 days ago she started develop a cough and a fever. Yesterday she developed chills. Cough productive with yellow phlegm. She also has PND. Denies shortness of breath. She has taken Robitussin-DM, Advil and Tylenol. Current temperature 102.5      Past Medical History:  Diagnosis Date  . CAD (coronary artery disease)    stents  . Hyperlipidemia    Past Surgical History:  Procedure Laterality Date  . CARDIAC CATHETERIZATION  2001   Percutaneous revascularization precedure with angioplasty to the proximal LAD and first diagonal   . CESAREAN SECTION    . CHOLECYSTECTOMY    . PARTIAL HYSTERECTOMY    . TONSILLECTOMY  1960   Family History  Problem Relation Age of Onset  . COPD Mother   . Heart failure Mother   . Diabetes Mother   . Other Mother     mitral valve leakage  . Heart attack Father     multiple heart attacks  . Heart attack Sister   . Heart attack Maternal Grandfather   . Heart attack Paternal Grandfather     multiple hearts attacks  . Stroke Paternal Grandfather    Social History  Substance Use Topics  . Smoking status: Never Smoker  . Smokeless tobacco: Never Used  . Alcohol use No   OB History    No data available     Review of Systems  Constitutional: Positive for activity change, chills and fever.  HENT: Positive for postnasal drip and rhinorrhea.   Respiratory: Positive for cough. Negative for chest tightness, shortness of breath and wheezing.   Cardiovascular: Negative.   Gastrointestinal: Negative.   Skin: Negative.   Neurological: Negative.   All other systems reviewed and are negative.   Allergies  Other and Naproxen  Home Medications   Prior to  Admission medications   Medication Sig Start Date End Date Taking? Authorizing Provider  acetaminophen (TYLENOL) 500 MG tablet Take 500 mg by mouth every 6 (six) hours as needed for pain.   Yes Historical Provider, MD  aspirin EC 81 MG tablet Take 1 tablet (81 mg total) by mouth daily. 10/30/16  Yes Mihai Croitoru, MD  ibuprofen (ADVIL,MOTRIN) 200 MG tablet Take 200 mg by mouth every 6 (six) hours as needed.   Yes Historical Provider, MD  metoprolol succinate (TOPROL-XL) 50 MG 24 hr tablet Take 25 mg by mouth daily. 03/10/13  Yes Historical Provider, MD  Multiple Vitamin (MULTIVITAMIN WITH MINERALS) TABS Take 1 tablet by mouth daily.   Yes Historical Provider, MD  Potassium (POTASSIMIN PO) Take 1 tablet by mouth daily.   Yes Historical Provider, MD  pravastatin (PRAVACHOL) 20 MG tablet Take 1 tablet by mouth daily. 03/10/13  Yes Historical Provider, MD  triamcinolone cream (KENALOG) 0.1 % as needed. 04/13/15  Yes Historical Provider, MD  triamterene-hydrochlorothiazide (MAXZIDE-25) 37.5-25 MG per tablet Take 1 tablet by mouth daily. 03/10/13  Yes Historical Provider, MD  azithromycin (ZITHROMAX) 250 MG tablet 2 tabs po on day one, then one tablet po once daily on days 2-5. 01/18/17   Hayden Rasmussen, NP  cefdinir (OMNICEF) 300 MG capsule Take 1 capsule (300 mg total) by mouth 2 (two) times daily. 01/18/17   Hayden Rasmussen, NP  chlorpheniramine-HYDROcodone [facility]  ER) 10-8 MG/5ML SUER Take 1/2 to 1 tsp po q 12 hr prn cough and drainage. Will cause drowsiness. 01/18/17   Hayden Rasmussenavid Leshay Desaulniers, NP   Meds Ordered and Administered this Visit   Medications  acetaminophen (TYLENOL) tablet 650 mg (650 mg Oral Given 01/18/17 1743)    BP 168/73 (BP Location: Right Arm)   Pulse 111   Temp 102.5 F (39.2 C) (Oral)   Resp 18   SpO2 99%  No data found.   Physical Exam  Constitutional: She is oriented to person, place, and time. She appears well-developed and well-nourished. No distress.  HENT:  Right Ear: External  ear normal.  Left Ear: External ear normal.  Mouth/Throat: No oropharyngeal exudate.  Oropharynx with a band of erythema on the left and right aspects. Clear PND. No exudates or swelling. Bilateral TMs are normal.  Eyes: EOM are normal.  Neck: Normal range of motion. Neck supple.  Cardiovascular: Normal rate, regular rhythm and normal heart sounds.   Pulmonary/Chest: Effort normal. No respiratory distress. She has no wheezes.  Lungs are clear with the exception of left basilar fine crackles with deep inspiration.  Musculoskeletal: Normal range of motion. She exhibits no edema.  Lymphadenopathy:    She has no cervical adenopathy.  Neurological: She is alert and oriented to person, place, and time.  Skin: Skin is warm and dry.  Psychiatric: She has a normal mood and affect.  Nursing note and vitals reviewed.   Urgent Care Course     Procedures (including critical care time)  Labs Review Labs Reviewed - No data to display  Imaging Review Dg Chest 2 View  Result Date: 01/18/2017 CLINICAL DATA:  Per pt: sick for about three days, fever 100.8, cough, body aches. Non-smoker. History of Bronchitis. Three stints in 2001 and 2005. HBP controlled with medication. Patient is not a diabetic EXAM: CHEST  2 VIEW COMPARISON:  None. FINDINGS: Midline trachea. Borderline cardiomegaly. Atherosclerosis in the transverse aorta. No pleural effusion or pneumothorax. Subtle increased density in the left infrahilar region and left lower lobe is suspicious for early or mild pneumonia. Clear right lung. IMPRESSION: Suspicion of left lower lobe early or mild pneumonia. Followup PA and lateral chest X-ray is recommended in 3-4 weeks following trial of antibiotic therapy to ensure resolution and exclude underlying malignancy. Aortic atherosclerosis. Electronically Signed   By: Jeronimo GreavesKyle  Talbot M.D.   On: 01/18/2017 18:51     Visual Acuity Review  Right Eye Distance:   Left Eye Distance:   Bilateral Distance:     Right Eye Near:   Left Eye Near:    Bilateral Near:         MDM   1. Community acquired pneumonia of left lower lobe of lung [facility](HCC)    Your chest x-ray indicates a probable pneumonia in your left lower lung. You are being treated with antibiotics. You are also given a cough medicine. Be sure to take this medicine correctly and do not take more than directed. This medicine can cause drowsiness and lethargy. He may take Tylenol or ibuprofen as needed for discomfort or fever. Be sure to take deep breaths every hour while you are overweight. Follow-up with your primary care doctor later this week as needed. He definitely will need to follow-up and about 3 weeks to receive a repeat chest x-ray to make sure the pneumonia has resolved. Meds ordered this encounter  Medications  . ibuprofen (ADVIL,MOTRIN) 200 MG tablet    Sig: Take 200 mg by  mouth every 6 (six) hours as needed.  Marland Kitchen acetaminophen (TYLENOL) tablet 650 mg  . azithromycin (ZITHROMAX) 250 MG tablet    Sig: 2 tabs po on day one, then one tablet po once daily on days 2-5.    Dispense:  6 tablet    Refill:  0    Order Specific Question:   Supervising Provider    Answer:   Elvina Sidle [[id number]]  . cefdinir (OMNICEF) 300 MG capsule    Sig: Take 1 capsule (300 mg total) by mouth 2 (two) times daily.    Dispense:  14 capsule    Refill:  0    Order Specific Question:   Supervising Provider    Answer:   Elvina Sidle [[id number]]  . chlorpheniramine-HYDROcodone (TUSSIONEX PENNKINETIC ER) 10-8 MG/5ML SUER    Sig: Take 1/2 to 1 tsp po q 12 hr prn cough and drainage. Will cause drowsiness.    Dispense:  90 mL    Refill:  0    Order Specific Question:   Supervising Provider    Answer:   Lonia Blood       Hayden Rasmussen, NP 01/18/17 1920

## 2017-01-30 NOTE — Progress Notes (Signed)
Office Visit Note   Patient: Madison FloridaCynthia N Chesterfield           Date of Birth: October 29, 1951           MRN: [id number] Visit Date: 02/02/2017              Requested by: Merri BrunetteWalter Pharr, MD [address] [place], [place] [id number] PCP: Londell MohPHARR,WALTER DAVIDSON, MD  Chief Complaint  Patient presents with  . Left Ankle - Follow-up    DO[place]13[place]17 [place]    HPI: Patient is a 66 y.o female who presents today for follow up of calcaneofibular ligament of left ankle. She was advised to continue with fracture boot and medical compression stocking. She does still have some soreness, aching and weakness. Donalee CitrinStepheney L Peele, RT    Assessment & Plan: Visit Diagnoses: No diagnosis found.  Plan: We'll place her in an ASO. She'll wean out of fracture boot. Follow-up in 4 weeks for repeat evaluation.  Follow-Up Instructions: No Follow-up on file.   Ortho Exam Patient is alert oriented no adenopathy well-dressed normal affect normal respiratory effort.  Patient has an antalgic gait. Examination she has a good dorsalis pedis pulse she has varicose veins but no venous ulcers. She is tender to palpation over the deltoid and the lateral ankle ligaments. Anterior drawer is stable. There is no tenderness anteriorly no signs of impingement. ROS: Complete review of systems negative except as noted in the history of present illness. Imaging: No results found.  Labs: No results found for: HGBA1C, ESRSEDRATE, CRP, LABURIC, REPTSTATUS, GRAMSTAIN, CULT, LABORGA  Orders:  No orders of the defined types were placed in this encounter.  No orders of the defined types were placed in this encounter.    Procedures: No procedures performed  Clinical Data: No additional findings.  Subjective: Review of Systems  Objective: Vital Signs: There were no vitals taken for this visit.  Specialty Comments:  No specialty comments available.  PMFS  History: Patient Active Problem List   Diagnosis Date Noted  . Sprain of calcaneofibular ligament of left ankle 01/16/2017  . Obesity (BMI 30.0-34.9) 01/05/2015  . CAD S/P percutaneous coronary angioplasty 04/21/2013  . Hyperlipidemia 04/21/2013  . HTN (hypertension) 04/21/2013   Past Medical History:  Diagnosis Date  . CAD (coronary artery disease)    stents  . Hyperlipidemia     Family History  Problem Relation Age of Onset  . COPD Mother   . Heart failure Mother   . Diabetes Mother   . Other Mother     mitral valve leakage  . Heart attack Father     multiple heart attacks  . Heart attack Sister   . Heart attack Maternal Grandfather   . Heart attack Paternal Grandfather     multiple hearts attacks  . Stroke Paternal Grandfather     Past Surgical History:  Procedure Laterality Date  . CARDIAC CATHETERIZATION  2001   Percutaneous revascularization precedure with angioplasty to the proximal LAD and first diagonal   . CESAREAN SECTION    . CHOLECYSTECTOMY    . PARTIAL HYSTERECTOMY    . TONSILLECTOMY  1960   Social History   Occupational History  . Not on file.   Social History Main Topics  . Smoking status: Never Smoker  . Smokeless tobacco: Never Used  . Alcohol use No  . Drug use: No  . Sexual activity: Not on file

## 2017-02-02 ENCOUNTER — Ambulatory Visit (INDEPENDENT_AMBULATORY_CARE_PROVIDER_SITE_OTHER): Payer: PPO

## 2017-02-02 ENCOUNTER — Ambulatory Visit (INDEPENDENT_AMBULATORY_CARE_PROVIDER_SITE_OTHER): Payer: PPO | Admitting: Orthopedic Surgery

## 2017-02-02 ENCOUNTER — Encounter (INDEPENDENT_AMBULATORY_CARE_PROVIDER_SITE_OTHER): Payer: Self-pay | Admitting: Orthopedic Surgery

## 2017-02-02 DIAGNOSIS — S93412D Sprain of calcaneofibular ligament of left ankle, subsequent encounter: Secondary | ICD-10-CM

## 2017-02-10 DIAGNOSIS — R195 Other fecal abnormalities: Secondary | ICD-10-CM | POA: Diagnosis not present

## 2017-02-10 DIAGNOSIS — J189 Pneumonia, unspecified organism: Secondary | ICD-10-CM | POA: Diagnosis not present

## 2017-03-09 ENCOUNTER — Encounter (INDEPENDENT_AMBULATORY_CARE_PROVIDER_SITE_OTHER): Payer: Self-pay | Admitting: Orthopedic Surgery

## 2017-03-09 ENCOUNTER — Ambulatory Visit (INDEPENDENT_AMBULATORY_CARE_PROVIDER_SITE_OTHER): Payer: PPO | Admitting: Orthopedic Surgery

## 2017-03-09 VITALS — Ht 68.0 in | Wt 224.0 lb

## 2017-03-09 DIAGNOSIS — S93412D Sprain of calcaneofibular ligament of left ankle, subsequent encounter: Secondary | ICD-10-CM

## 2017-03-09 NOTE — Progress Notes (Signed)
Office Visit Note   Patient: Madison Mosley           Date of Birth: [birth year]           MRN: [id number] Visit Date: 03/09/2017              Requested by: Merri Brunette, MD [address] [place], [place] [address] PCP: Londell Moh, MD  Chief Complaint  Patient presents with  . Right Ankle - Follow-up      HPI: Patient status post sprain of her left ankle. Patient states that her ankle is sore at the end of the day, and has difficulty going up and downstairs.  Assessment & Plan: Visit Diagnoses:  1. Sprain of calcaneofibular ligament of left ankle, subsequent encounter     Plan: Recommended continue exercises and strengthening. Patient states she's going on a mission trip in [place] and recommended that she take an ASO with her.  Follow-Up Instructions: Return if symptoms worsen or fail to improve.   Ortho Exam  Patient is alert, oriented, no adenopathy, well-dressed, normal affect, normal respiratory effort. Patient has a normal gait. Examination she has good pulses she has good range of motion the ankle and subtalar joint. Anterior drawer is stable without laxity. She has a little bit of tenderness to palpation of the deltoid ligament she is a little bit of tenderness to palpation over the anterior talofibular ligament. There is no instability.  Imaging: No results found.  Labs: No results found for: HGBA1C, ESRSEDRATE, CRP, LABURIC, REPTSTATUS, GRAMSTAIN, CULT, LABORGA  Orders:  No orders of the defined types were placed in this encounter.  No orders of the defined types were placed in this encounter.    Procedures: No procedures performed  Clinical Data: No additional findings.  ROS:  All other systems negative, except as noted in the HPI. Review of Systems  Objective: Vital Signs: Ht  (1.727 m)   Wt 224 lb (101.6 kg)   BMI 34.06 kg/m   Specialty Comments:  No specialty comments available.  PMFS History: Patient  Active Problem List   Diagnosis Date Noted  . Sprain of calcaneofibular ligament of left ankle 01/16/2017  . Obesity (BMI 30.0-34.9) 01/05/2015  . CAD S/P percutaneous coronary angioplasty 04/21/2013  . Hyperlipidemia 04/21/2013  . HTN (hypertension) 04/21/2013   Past Medical History:  Diagnosis Date  . CAD (coronary artery disease)    stents  . Hyperlipidemia     Family History  Problem Relation Age of Onset  . COPD Mother   . Heart failure Mother   . Diabetes Mother   . Other Mother     mitral valve leakage  . Heart attack Father     multiple heart attacks  . Heart attack Sister   . Heart attack Maternal Grandfather   . Heart attack Paternal Grandfather     multiple hearts attacks  . Stroke Paternal Grandfather     Past Surgical History:  Procedure Laterality Date  . CARDIAC CATHETERIZATION  2001   Percutaneous revascularization precedure with angioplasty to the proximal LAD and first diagonal   . CESAREAN SECTION    . CHOLECYSTECTOMY    . PARTIAL HYSTERECTOMY    . TONSILLECTOMY  1960   Social History   Occupational History  . Not on file.   Social History Main Topics  . Smoking status: Never Smoker  . Smokeless tobacco: Never Used  . Alcohol use No  . Drug use: No  . Sexual activity:  Not on file

## 2017-03-12 DIAGNOSIS — R195 Other fecal abnormalities: Secondary | ICD-10-CM | POA: Diagnosis not present

## 2017-04-10 DIAGNOSIS — K635 Polyp of colon: Secondary | ICD-10-CM | POA: Diagnosis not present

## 2017-04-13 DIAGNOSIS — K635 Polyp of colon: Secondary | ICD-10-CM | POA: Diagnosis not present

## 2017-04-13 DIAGNOSIS — Z86010 Personal history of colonic polyps: Secondary | ICD-10-CM | POA: Diagnosis not present

## 2017-04-17 DIAGNOSIS — K635 Polyp of colon: Secondary | ICD-10-CM | POA: Diagnosis not present

## 2017-04-23 DIAGNOSIS — Z86010 Personal history of colonic polyps: Secondary | ICD-10-CM | POA: Diagnosis not present

## 2017-06-18 ENCOUNTER — Ambulatory Visit (INDEPENDENT_AMBULATORY_CARE_PROVIDER_SITE_OTHER): Payer: PPO | Admitting: Podiatry

## 2017-06-18 ENCOUNTER — Ambulatory Visit (INDEPENDENT_AMBULATORY_CARE_PROVIDER_SITE_OTHER): Payer: PPO

## 2017-06-18 ENCOUNTER — Encounter: Payer: Self-pay | Admitting: Podiatry

## 2017-06-18 VITALS — BP 149/71 | HR 74 | Resp 16

## 2017-06-18 DIAGNOSIS — M722 Plantar fascial fibromatosis: Secondary | ICD-10-CM

## 2017-06-18 DIAGNOSIS — N952 Postmenopausal atrophic vaginitis: Secondary | ICD-10-CM | POA: Insufficient documentation

## 2017-06-18 MED ORDER — METHYLPREDNISOLONE 4 MG PO TBPK: ORAL_TABLET | ORAL | 0 refills | Status: DC

## 2017-06-18 MED ORDER — MELOXICAM 15 MG PO TABS: 15.0000 mg | ORAL_TABLET | Freq: Every day | ORAL | 3 refills | Status: DC

## 2017-06-18 NOTE — Patient Instructions (Signed)

## 2017-06-18 NOTE — Progress Notes (Signed)
   Subjective:    Patient ID: Madison Mosley, female    DOB: 24-Aug-1951, 66 y.o.   MRN: [id number]  HPI: She presents today with a chief complaint of pain to the plantar medial heel and plantar arch right foot. She states is aching now for about a month wiser particularly bad she's tried wearing supportive shoes and she takes Advil as needed. She was treated for plantar fasciitis 25 years ago with injections and would like to consider another injection.    Review of Systems  Allergic/Immunologic: Positive for food allergies.  Hematological: Bruises/bleeds easily.  All other systems reviewed and are negative.      Objective:   Physical Exam: Vital signs are stable alert and oriented 3. Pulses are palpable. Neurological sensorium is intact deep tendon reflexes are intact and brisk bilaterally and symmetrical. Muscle strength +5 over 5 dorsiflexion plantar flexors and inverters everters all intrinsic musculature is intact. Orthopedic evaluation demonstrates all joints distal to the ankle have full range of motion without crepitation. She does have pain on palpation medial calcaneal tubercle of the right heel. Radiographs taken in the office today demonstrate primarily large posterior and inferior calcaneal heel spurs with soft tissue increasing density at the plantar fascial calcaneal insertion site indicative of posterior fasciitis and heel spur syndrome.        Assessment & Plan:  Assessment: Plantar fasciitis right foot.  Plan: I injected the area today with Kenalog and local anesthetic started her on Medrol Dosepak to be followed by meloxicam. I would like to dispense plantar fascia brace and a night splint however her insurance will not allow this without preauthorization. And I will follow up with Madison Mosley in 1 month. We discussed the etiology pathology conservative versus surgical therapies were discussed progress shoe gear stretching exercises ice therapy and shoe gear modifications.  We will notify her once we have precertified these devices.

## 2017-07-16 ENCOUNTER — Encounter: Payer: Self-pay | Admitting: Podiatry

## 2017-07-16 ENCOUNTER — Ambulatory Visit (INDEPENDENT_AMBULATORY_CARE_PROVIDER_SITE_OTHER): Payer: PPO | Admitting: Podiatry

## 2017-07-16 DIAGNOSIS — M722 Plantar fascial fibromatosis: Secondary | ICD-10-CM

## 2017-07-16 NOTE — Progress Notes (Signed)
She presents for follow-up of right heel pain. She states that it's a little better.  Objective: Vital signs are stable alert and oriented 3. Pulses are palpable. She has pain on palpation mucogingival the right heel.  Assessment: Plantar fasciitis right heel. 60% resolved.  Plan: Reinjected the right heel today recommend that she continue all conservative therapies including plantar fascial brace night splint appropriate shoe gear and oral anti-inflammatories.

## 2017-08-27 ENCOUNTER — Ambulatory Visit: Payer: PPO | Admitting: Podiatry

## 2017-10-14 DIAGNOSIS — Z23 Encounter for immunization: Secondary | ICD-10-CM | POA: Diagnosis not present

## 2017-10-14 DIAGNOSIS — I10 Essential (primary) hypertension: Secondary | ICD-10-CM | POA: Diagnosis not present

## 2017-10-14 DIAGNOSIS — K219 Gastro-esophageal reflux disease without esophagitis: Secondary | ICD-10-CM | POA: Diagnosis not present

## 2017-10-14 DIAGNOSIS — E7800 Pure hypercholesterolemia, unspecified: Secondary | ICD-10-CM | POA: Diagnosis not present

## 2017-10-14 DIAGNOSIS — Z0000 Encounter for general adult medical examination without abnormal findings: Secondary | ICD-10-CM | POA: Diagnosis not present

## 2017-10-14 DIAGNOSIS — N390 Urinary tract infection, site not specified: Secondary | ICD-10-CM | POA: Diagnosis not present

## 2017-10-19 DIAGNOSIS — E7800 Pure hypercholesterolemia, unspecified: Secondary | ICD-10-CM | POA: Diagnosis not present

## 2017-10-19 DIAGNOSIS — I10 Essential (primary) hypertension: Secondary | ICD-10-CM | POA: Diagnosis not present

## 2017-10-19 DIAGNOSIS — Z6838 Body mass index (BMI) 38.0-38.9, adult: Secondary | ICD-10-CM | POA: Diagnosis not present

## 2017-10-19 DIAGNOSIS — R7303 Prediabetes: Secondary | ICD-10-CM | POA: Diagnosis not present

## 2017-10-19 DIAGNOSIS — E876 Hypokalemia: Secondary | ICD-10-CM | POA: Diagnosis not present

## 2017-10-19 DIAGNOSIS — K219 Gastro-esophageal reflux disease without esophagitis: Secondary | ICD-10-CM | POA: Diagnosis not present

## 2017-10-19 DIAGNOSIS — I2510 Atherosclerotic heart disease of native coronary artery without angina pectoris: Secondary | ICD-10-CM | POA: Diagnosis not present

## 2017-10-19 DIAGNOSIS — I8390 Asymptomatic varicose veins of unspecified lower extremity: Secondary | ICD-10-CM | POA: Diagnosis not present

## 2017-10-19 DIAGNOSIS — I341 Nonrheumatic mitral (valve) prolapse: Secondary | ICD-10-CM | POA: Diagnosis not present

## 2017-10-19 DIAGNOSIS — Z6834 Body mass index (BMI) 34.0-34.9, adult: Secondary | ICD-10-CM | POA: Diagnosis not present

## 2017-10-19 DIAGNOSIS — E782 Mixed hyperlipidemia: Secondary | ICD-10-CM | POA: Diagnosis not present

## 2017-10-19 DIAGNOSIS — Z0001 Encounter for general adult medical examination with abnormal findings: Secondary | ICD-10-CM | POA: Diagnosis not present

## 2017-10-29 DIAGNOSIS — Z79899 Other long term (current) drug therapy: Secondary | ICD-10-CM | POA: Diagnosis not present

## 2017-11-05 ENCOUNTER — Encounter: Payer: Self-pay | Admitting: Physician Assistant

## 2017-11-05 ENCOUNTER — Other Ambulatory Visit: Payer: Self-pay | Admitting: Physician Assistant

## 2017-11-05 ENCOUNTER — Ambulatory Visit (INDEPENDENT_AMBULATORY_CARE_PROVIDER_SITE_OTHER): Payer: PPO | Admitting: Physician Assistant

## 2017-11-05 VITALS — BP 132/70 | HR 69 | Ht 68.0 in

## 2017-11-05 DIAGNOSIS — Z9861 Coronary angioplasty status: Secondary | ICD-10-CM

## 2017-11-05 DIAGNOSIS — I2510 Atherosclerotic heart disease of native coronary artery without angina pectoris: Secondary | ICD-10-CM | POA: Diagnosis not present

## 2017-11-05 DIAGNOSIS — R0609 Other forms of dyspnea: Principal | ICD-10-CM

## 2017-11-05 DIAGNOSIS — E785 Hyperlipidemia, unspecified: Secondary | ICD-10-CM | POA: Diagnosis not present

## 2017-11-05 NOTE — Progress Notes (Signed)
Cardiology Office Note   Date:  11/05/2017   ID:  Madison Mosley, DOB 1950-12-22, MRN [id number]  PCP:  Merri BrunettePharr, Walter, MD  Cardiologist: Dr. Royann Shiversroitoru, 10/30/2016 Madison Mosley Ayiden Milliman, PA-C   No chief complaint on file.   History of Present Illness: Madison Mosley is a 66 y.o. female with a history of 3 stents 2001-2002, nl MV 2011, HLD intol statins, Zetia, Welchol but tolerating low-dose Pravachol, on maint Brilinta w/out ASA  10/30/2016 office visit, Brilinta changed to aspirin 81 mg daily for bleeding issues, PCP researching PCS K9 inhibitors  Madison Floridaynthia N Mcquary presents for cardiology follow up.  She wants to lose weight, is looking to make changes in her eating habits.   She did not qualify for PCS K9 inhibitors because she is getting results from Pravachol and the cost would be too high.  She helps care for her 7 grandchildren. She stays busy doing this. She did not have any problems with walking on [place] in [place] and on a mission trip in [place]. She did have some DOE, feels it was in proportion to the exertion.  However, she does not exercise and does not routinely walk long distances.  She has some daytime LE edema, does not wake with it.  Denies orthopnea or PND.  She rarely gets palpitations, will get a brief flutter 1-2 x year. No presyncope or syncope.  She did not have CP prior to stenting. The first time she failed a screening stress test, the second time and third time she was having DOE.   Past Medical History:  Diagnosis Date  . CAD (coronary artery disease)    stents  . Hyperlipidemia     Past Surgical History:  Procedure Laterality Date  . CARDIAC CATHETERIZATION  2001   Percutaneous revascularization precedure with angioplasty to the proximal LAD and first diagonal   . CESAREAN SECTION    . CHOLECYSTECTOMY    . PARTIAL HYSTERECTOMY    . TONSILLECTOMY  1960    Current Outpatient Medications  Medication Sig Dispense Refill  . aspirin EC 81  MG tablet Take 1 tablet (81 mg total) by mouth daily. 90 tablet 3  . meloxicam (MOBIC) 15 MG tablet Take 1 tablet (15 mg total) by mouth daily. 30 tablet 3  . metoprolol succinate (TOPROL-XL) 50 MG 24 hr tablet Take 25 mg by mouth daily.    . Multiple Vitamin (MULTIVITAMIN WITH MINERALS) TABS Take 1 tablet by mouth daily.    . Potassium (POTASSIMIN PO) Take 1 tablet by mouth daily.    . pravastatin (PRAVACHOL) 20 MG tablet Take 1 tablet by mouth daily.    Marland Kitchen. triamcinolone cream (KENALOG) 0.1 % as needed.    . triamterene-hydrochlorothiazide (MAXZIDE-25) 37.5-25 MG per tablet Take 0.5 tablets by mouth daily.      No current facility-administered medications for this visit.     Allergies:   Other; Crestor  [rosuvastatin calcium]; Ezetimibe; Pitavastatin; Welchol  [colesevelam hcl]; Zocor  [simvastatin]; and Naproxen    Social History:  The patient  reports that  has never smoked. she has never used smokeless tobacco. She reports that she does not drink alcohol or use drugs.   Family History:  The patient's family history includes CAD (age of onset: 3042) in her brother; COPD in her mother; Diabetes in her mother; Heart attack in her maternal grandfather and paternal grandfather; Heart attack (age of onset: 4844) in her sister; Heart attack (age of onset: 651) in her father; Heart  failure in her mother; Stroke in her paternal grandfather; Valvular heart disease in her mother.    ROS:  Please see the history of present illness. All other systems are reviewed and negative.    PHYSICAL EXAM: VS:  BP 132/70   Pulse 69   Ht 5\' 8"  (1.727 m)   BMI 34.06 kg/m  , BMI Body mass index is 34.06 kg/m. GEN: Well nourished, well developed, female in no acute distress  HEENT: normal for age  Neck: no JVD, no carotid bruit, no masses Cardiac: RRR; no murmur, no rubs, or gallops Respiratory:  clear to auscultation bilaterally, normal work of breathing GI: soft, nontender, nondistended, + BS MS: no deformity  or atrophy; no edema; distal pulses are 2+ in all 4 extremities   Skin: warm and dry, no rash Neuro:  Strength and sensation are intact Psych: euthymic mood, full affect   EKG:  EKG is ordered today. The ekg ordered today demonstrates sinus rhythm, heart rate 69, no change from 10/2016 ECG   Recent Labs: No results found for requested labs within last 8760 hours.    Lipid Panel No results found for: CHOL, TRIG, HDL, CHOLHDL, VLDL, LDLCALC, LDLDIRECT   Wt Readings from Last 3 Encounters:  03/09/17 224 lb (101.6 kg)  01/16/17 224 lb (101.6 kg)  10/30/16 224 lb (101.6 kg)     Other studies Reviewed: Additional studies/ records that were reviewed today include: office notes, hospital records and testing.  ASSESSMENT AND PLAN:  1.  CAD: Her genetics are extremely unfavorable, and her LDL is not at goal.  She is having some dyspnea on exertion and does not walk long distances regularly enough to be sure that it has not progressed.  I recommend a stress Myoview and she agrees.  Schedule this and then follow-up with MD.  2.  Hyperlipidemia: Records from Dr. Renne CriglerPharr show an LDL at 110 on pravastatin.  I explained that her goal LDL is less than 70.  She admits that she could make some dietary changes and will try to do so.       Both Dr. Renne CriglerPharr and a specialist reviewed PCS K9 inhibitors and that was not an option. She was told she did not qualify because she was on the Pravachol and the cost was prohibitive as well.  3.  Obesity: I have encouraged her to make heart healthy eating changes because I feel she will be healthier and her cardiac risk will be lower if she can lose weight.  She says she will try.   Current medicines are reviewed at length with the patient today.  The patient does not have concerns regarding medicines.  The following changes have been made:  no change  Labs/ tests ordered today include:  No orders of the defined types were placed in this  encounter.    Disposition:   FU with Dr. Royann Shiversroitoru  Signed, Madison Mosley Vaibhav Fogleman, PA-C  11/05/2017 9:03 AM    [facility] Phone: [phone]; Fax: [phone]  This note was written with the assistance of speech recognition software. Please excuse any transcriptional errors.

## 2017-11-05 NOTE — Patient Instructions (Addendum)
Medication Instructions:  NO CHANGES If you need a refill on your cardiac medications before your next appointment, please call your pharmacy.  Testing/Procedures: Your physician has requested that you have a stress myoview. A Myoview stress test is used to check for coronary disease. During the procedure, a small amount of radioactive isotope called Myoview is injected into a vein, one injection is given through an IV when you first begin the exam called resting images, and a second injection is given while you exercise on a treadmill. The Myoview circulates in your bloodstream and is absorbed by your heart while a special camera takes pictures that show whether your heart is receiving enough blood and oxygen.  Follow-Up: Your physician wants you to follow-up in: 12 MONTHS WITH DR ZOXWRUEACROITORU. You should receive a reminder letter in the mail two months in advance. If you do not receive a letter, please call our office October 2019 to schedule the December 2019 follow-up appointment.   Thank you for choosing [facility] at [facility]!!

## 2017-11-26 DIAGNOSIS — Z1231 Encounter for screening mammogram for malignant neoplasm of breast: Secondary | ICD-10-CM | POA: Diagnosis not present

## 2017-11-26 DIAGNOSIS — Z6835 Body mass index (BMI) 35.0-35.9, adult: Secondary | ICD-10-CM | POA: Diagnosis not present

## 2017-11-26 DIAGNOSIS — Z01419 Encounter for gynecological examination (general) (routine) without abnormal findings: Secondary | ICD-10-CM | POA: Diagnosis not present

## 2017-12-10 ENCOUNTER — Inpatient Hospital Stay (HOSPITAL_COMMUNITY)
Admission: RE | Admit: 2017-12-10 | Payer: PPO | Source: Ambulatory Visit | Attending: Physician Assistant | Admitting: Physician Assistant

## 2018-01-05 ENCOUNTER — Telehealth (HOSPITAL_COMMUNITY): Payer: Self-pay

## 2018-01-05 NOTE — Telephone Encounter (Signed)
Encounter complete. 

## 2018-01-07 ENCOUNTER — Ambulatory Visit (HOSPITAL_COMMUNITY)
Admission: RE | Admit: 2018-01-07 | Discharge: 2018-01-07 | Disposition: A | Discharge status: Home / Self Care | Payer: PPO | Source: Ambulatory Visit | Attending: Physician Assistant | Admitting: Physician Assistant

## 2018-01-07 DIAGNOSIS — R0609 Other forms of dyspnea: Secondary | ICD-10-CM | POA: Insufficient documentation

## 2018-01-07 DIAGNOSIS — Z8249 Family history of ischemic heart disease and other diseases of the circulatory system: Secondary | ICD-10-CM | POA: Diagnosis not present

## 2018-01-07 DIAGNOSIS — I10 Essential (primary) hypertension: Secondary | ICD-10-CM | POA: Insufficient documentation

## 2018-01-07 DIAGNOSIS — R5383 Other fatigue: Secondary | ICD-10-CM | POA: Insufficient documentation

## 2018-01-07 DIAGNOSIS — I2510 Atherosclerotic heart disease of native coronary artery without angina pectoris: Secondary | ICD-10-CM | POA: Insufficient documentation

## 2018-01-07 LAB — MYOCARDIAL PERFUSION IMAGING
CHL CUP MPHR: 153 {beats}/min
CSEPED: 6 min
Estimated workload: 7 METS
Exercise duration (sec): 1 s
LVDIAVOL: 84 mL (ref 46–106)
LVSYSVOL: 31 mL
NUC STRESS TID: 1.03
Peak HR: 153 {beats}/min
Percent HR: 100 %
RPE: 18
Rest HR: 64 {beats}/min
SDS: 0
SRS: 0
SSS: 0

## 2018-01-07 MED ORDER — TECHNETIUM TC 99M TETROFOSMIN IV KIT
10.5000 | PACK | Freq: Once | INTRAVENOUS | Status: AC | PRN
  Administered 2018-01-07: 10.5 via INTRAVENOUS
  Filled 2018-01-07: qty 11

## 2018-01-07 MED ORDER — TECHNETIUM TC 99M TETROFOSMIN IV KIT
30.9000 | PACK | Freq: Once | INTRAVENOUS | Status: AC | PRN
  Administered 2018-01-07: 30.9 via INTRAVENOUS
  Filled 2018-01-07: qty 31

## 2018-02-16 DIAGNOSIS — E782 Mixed hyperlipidemia: Secondary | ICD-10-CM | POA: Diagnosis not present

## 2018-02-18 DIAGNOSIS — Z6834 Body mass index (BMI) 34.0-34.9, adult: Secondary | ICD-10-CM | POA: Diagnosis not present

## 2018-02-18 DIAGNOSIS — E782 Mixed hyperlipidemia: Secondary | ICD-10-CM | POA: Diagnosis not present

## 2018-08-16 DIAGNOSIS — Z23 Encounter for immunization: Secondary | ICD-10-CM | POA: Diagnosis not present

## 2018-08-16 DIAGNOSIS — R7303 Prediabetes: Secondary | ICD-10-CM | POA: Diagnosis not present

## 2018-08-16 DIAGNOSIS — R197 Diarrhea, unspecified: Secondary | ICD-10-CM | POA: Diagnosis not present

## 2018-08-16 DIAGNOSIS — Z6834 Body mass index (BMI) 34.0-34.9, adult: Secondary | ICD-10-CM | POA: Diagnosis not present

## 2018-08-17 DIAGNOSIS — R197 Diarrhea, unspecified: Secondary | ICD-10-CM | POA: Diagnosis not present

## 2018-09-21 DIAGNOSIS — L239 Allergic contact dermatitis, unspecified cause: Secondary | ICD-10-CM | POA: Diagnosis not present

## 2018-09-30 DIAGNOSIS — L2084 Intrinsic (allergic) eczema: Secondary | ICD-10-CM | POA: Diagnosis not present

## 2018-10-21 DIAGNOSIS — K219 Gastro-esophageal reflux disease without esophagitis: Secondary | ICD-10-CM | POA: Diagnosis not present

## 2018-10-21 DIAGNOSIS — E782 Mixed hyperlipidemia: Secondary | ICD-10-CM | POA: Diagnosis not present

## 2018-10-26 DIAGNOSIS — R32 Unspecified urinary incontinence: Secondary | ICD-10-CM | POA: Diagnosis not present

## 2018-10-26 DIAGNOSIS — I10 Essential (primary) hypertension: Secondary | ICD-10-CM | POA: Diagnosis not present

## 2018-10-26 DIAGNOSIS — Z0000 Encounter for general adult medical examination without abnormal findings: Secondary | ICD-10-CM | POA: Diagnosis not present

## 2018-10-26 DIAGNOSIS — E7800 Pure hypercholesterolemia, unspecified: Secondary | ICD-10-CM | POA: Diagnosis not present

## 2018-10-26 DIAGNOSIS — Z6832 Body mass index (BMI) 32.0-32.9, adult: Secondary | ICD-10-CM | POA: Diagnosis not present

## 2018-10-26 DIAGNOSIS — R7303 Prediabetes: Secondary | ICD-10-CM | POA: Diagnosis not present

## 2018-10-26 DIAGNOSIS — I2510 Atherosclerotic heart disease of native coronary artery without angina pectoris: Secondary | ICD-10-CM | POA: Diagnosis not present

## 2018-10-26 DIAGNOSIS — K219 Gastro-esophageal reflux disease without esophagitis: Secondary | ICD-10-CM | POA: Diagnosis not present

## 2018-10-26 DIAGNOSIS — Z780 Asymptomatic menopausal state: Secondary | ICD-10-CM | POA: Diagnosis not present

## 2018-10-26 DIAGNOSIS — Z20828 Contact with and (suspected) exposure to other viral communicable diseases: Secondary | ICD-10-CM | POA: Diagnosis not present

## 2018-10-26 DIAGNOSIS — M8589 Other specified disorders of bone density and structure, multiple sites: Secondary | ICD-10-CM | POA: Diagnosis not present

## 2018-10-27 ENCOUNTER — Telehealth: Payer: Self-pay | Admitting: Cardiovascular Disease

## 2018-10-27 NOTE — Telephone Encounter (Signed)
Received notes from [facility] on 10/27/18, Appt 12/17/18 @ 9:20AM.[place]

## 2018-11-01 DIAGNOSIS — L2084 Intrinsic (allergic) eczema: Secondary | ICD-10-CM | POA: Diagnosis not present

## 2018-11-22 DIAGNOSIS — B370 Candidal stomatitis: Secondary | ICD-10-CM | POA: Diagnosis not present

## 2018-11-22 DIAGNOSIS — I10 Essential (primary) hypertension: Secondary | ICD-10-CM | POA: Diagnosis not present

## 2018-12-01 DIAGNOSIS — Z01419 Encounter for gynecological examination (general) (routine) without abnormal findings: Secondary | ICD-10-CM | POA: Diagnosis not present

## 2018-12-01 DIAGNOSIS — Z1231 Encounter for screening mammogram for malignant neoplasm of breast: Secondary | ICD-10-CM | POA: Diagnosis not present

## 2018-12-03 DIAGNOSIS — H5203 Hypermetropia, bilateral: Secondary | ICD-10-CM | POA: Diagnosis not present

## 2018-12-03 DIAGNOSIS — H353131 Nonexudative age-related macular degeneration, bilateral, early dry stage: Secondary | ICD-10-CM | POA: Diagnosis not present

## 2018-12-03 DIAGNOSIS — H2513 Age-related nuclear cataract, bilateral: Secondary | ICD-10-CM | POA: Diagnosis not present

## 2018-12-17 ENCOUNTER — Ambulatory Visit (INDEPENDENT_AMBULATORY_CARE_PROVIDER_SITE_OTHER): Payer: PPO | Admitting: Cardiovascular Disease

## 2018-12-17 ENCOUNTER — Encounter: Payer: Self-pay | Admitting: Cardiovascular Disease

## 2018-12-17 VITALS — BP 128/60 | HR 67 | Ht 68.0 in | Wt 208.0 lb

## 2018-12-17 DIAGNOSIS — I10 Essential (primary) hypertension: Secondary | ICD-10-CM | POA: Diagnosis not present

## 2018-12-17 DIAGNOSIS — E7800 Pure hypercholesterolemia, unspecified: Secondary | ICD-10-CM | POA: Diagnosis not present

## 2018-12-17 DIAGNOSIS — T466X5A Adverse effect of antihyperlipidemic and antiarteriosclerotic drugs, initial encounter: Secondary | ICD-10-CM

## 2018-12-17 DIAGNOSIS — Z9861 Coronary angioplasty status: Secondary | ICD-10-CM

## 2018-12-17 DIAGNOSIS — I2510 Atherosclerotic heart disease of native coronary artery without angina pectoris: Secondary | ICD-10-CM

## 2018-12-17 DIAGNOSIS — G720 Drug-induced myopathy: Secondary | ICD-10-CM

## 2018-12-17 DIAGNOSIS — E669 Obesity, unspecified: Secondary | ICD-10-CM | POA: Diagnosis not present

## 2018-12-17 NOTE — Progress Notes (Signed)
Cardiology Office Note    Date:  12/17/2018   ID:  Madison Mosley, DOB 1951-06-13, MRN [id number]  PCP:  Merri BrunettePharr, Walter, MD  Cardiologist:   Thurmon FairMihai Danalee Flath, MD   Chief Complaint  Patient presents with  . Follow-up  CAD  History of Present Illness:  Madison Mosley is a 68 y.o. female with early onset coronary artery disease (3 separate stent procedures 2001-2002) without need for revascularization in the last almost 20 years. Her last functional study was a normal nuclear stress test in Jnauary 2019.   The patient specifically denies any chest pain at rest exertion, dyspnea at rest or with exertion, orthopnea, paroxysmal nocturnal dyspnea, syncope, palpitations, focal neurological deficits, intermittent claudication, lower extremity edema, unexplained weight gain, cough, hemoptysis or wheezing.  She has significant hyperlipidemia but unfortunately has been intolerant to most statins, Zetia and WelChol.  She is currently on pravastatin 20 mg daily and was unable to tolerate higher doses due to allergy.  She was enrolled in a clinical trial of an oral cholesterol-lowering drug but developed a rash.  She has never taken PCSK9 inh.  Her most recent lipid profile shows a to  Past Medical History:  Diagnosis Date  . CAD (coronary artery disease)    stents  . Hyperlipidemia     Past Surgical History:  Procedure Laterality Date  . CARDIAC CATHETERIZATION  2001   Percutaneous revascularization precedure with angioplasty to the proximal LAD and first diagonal   . CESAREAN SECTION    . CHOLECYSTECTOMY    . PARTIAL HYSTERECTOMY    . TONSILLECTOMY  1960    Current Medications: Outpatient Medications Prior to Visit  Medication Sig Dispense Refill  . aspirin EC 81 MG tablet Take 1 tablet (81 mg total) by mouth daily. 90 tablet 3  . meloxicam (MOBIC) 15 MG tablet Take 1 tablet (15 mg total) by mouth daily. 30 tablet 3  . metoprolol succinate (TOPROL-XL) 50 MG 24 hr tablet Take 25 mg  by mouth daily.    . Multiple Vitamin (MULTIVITAMIN WITH MINERALS) TABS Take 1 tablet by mouth daily.    . Potassium (POTASSIMIN PO) Take 1 tablet by mouth daily.    . pravastatin (PRAVACHOL) 20 MG tablet Take 1 tablet by mouth daily.    Marland Kitchen. triamcinolone cream (KENALOG) 0.1 % as needed.    . triamterene-hydrochlorothiazide (MAXZIDE-25) 37.5-25 MG per tablet Take 0.5 tablets by mouth daily.      No facility-administered medications prior to visit.      Allergies:   Other; Crestor  [rosuvastatin calcium]; Ezetimibe; Pitavastatin; Welchol  [colesevelam hcl]; Zocor  [simvastatin]; and Naproxen   Social History   Socioeconomic History  . Marital status: Married    Spouse name: Not on file  . Number of children: Not on file  . Years of education: Not on file  . Highest education level: Not on file  Occupational History  . Not on file  Social Needs  . Financial resource strain: Not on file  . Food insecurity:    Worry: Not on file    Inability: Not on file  . Transportation needs:    Medical: Not on file    Non-medical: Not on file  Tobacco Use  . Smoking status: Never Smoker  . Smokeless tobacco: Never Used  Substance and Sexual Activity  . Alcohol use: No    Alcohol/week: 0.0 standard drinks  . Drug use: No  . Sexual activity: Not on file  Lifestyle  .  Physical activity:    Days per week: Not on file    Minutes per session: Not on file  . Stress: Not on file  Relationships  . Social connections:    Talks on phone: Not on file    Gets together: Not on file    Attends religious service: Not on file    Active member of club or organization: Not on file    Attends meetings of clubs or organizations: Not on file    Relationship status: Not on file  Other Topics Concern  . Not on file  Social History Narrative  . Not on file     Family History:  The patient's family history includes CAD (age of onset: 66) in her brother; COPD in her mother; Diabetes in her mother; Heart  attack in her maternal grandfather and paternal grandfather; Heart attack (age of onset: 9) in her sister; Heart attack (age of onset: 15) in her father; Heart failure in her mother; Stroke in her paternal grandfather; Valvular heart disease in her mother.   ROS:   Please see the history of present illness.    ROS all systems are reviewed and are negative  PHYSICAL EXAM:   VS:  BP 128/60   Pulse 67   Ht 5\' 8"  (1.727 m)   Wt 208 lb (94.3 kg)   BMI 31.63 kg/m     General: Alert, oriented x3, no distress, mildly obese Head: no evidence of trauma, PERRL, EOMI, no exophtalmos or lid lag, no myxedema, no xanthelasma; normal ears, nose and oropharynx Neck: normal jugular venous pulsations and no hepatojugular reflux; brisk carotid pulses without delay and no carotid bruits Chest: clear to auscultation, no signs of consolidation by percussion or palpation, normal fremitus, symmetrical and full respiratory excursions Cardiovascular: normal position and quality of the apical impulse, regular rhythm, normal first and second heart sounds, no murmurs, rubs or gallops Abdomen: no tenderness or distention, no masses by palpation, no abnormal pulsatility or arterial bruits, normal bowel sounds, no hepatosplenomegaly Extremities: no clubbing, cyanosis or edema; 2+ radial, ulnar and brachial pulses bilaterally; 2+ right femoral, posterior tibial and dorsalis pedis pulses; 2+ left femoral, posterior tibial and dorsalis pedis pulses; no subclavian or femoral bruits Neurological: grossly nonfocal Psych: Normal mood and affect   Wt Readings from Last 3 Encounters:  12/17/18 208 lb (94.3 kg)  01/07/18 224 lb (101.6 kg)  03/09/17 224 lb (101.6 kg)      Studies/Labs Reviewed:   EKG:  EKG is ordered today.  Shows normal sinus rhythm, normal tracing other than very minor nonspecific ST segment changes in V4- V5  Recent Labs: Creatinine 0.89, normal LFTs, hemoglobin 12.5  Lipid Panel  total cholesterol  211, LDL 98, triglycerides 153, HDL 55 (10/21/2018).    ASSESSMENT:    1. CAD S/P percutaneous coronary angioplasty   2. Essential hypertension   3. Hypercholesterolemia      PLAN:  In order of problems listed above:  1. CAD: Asymptomatic, no evidence of almost 20 years 2. HTN: Well-controlled 3. HLP: She fully meets criteria for PCS K9 inhibitors.  I think when we try to get her this medications a couple of years ago she was discouraged by the cost.  She did not qualify for the patient assistance program.  Reviewed the fact that because this confounds essentially not the typical copayment may be only around $40 a month.  She is still a little reluctant about it and will need to think about it  some more.  She will call us back.  I consider this with her clinical pharmacist.  She has therapy she agrees.    Medication Adjustments/Labs and Tests Ordered: Current medicines are reviewed at length with the patient today.  Concerns regarding medicines are outlined above.  Medication changes, Labs and Tests ordered today are listed in the Patient Instructions below. Patient Instructions  Medication Instructions:  Continue current medications  Dr. Royann Shiversroitoru recommends that you start REPATHA (injectable cholesterol medication) Please call our office if you wish to proceed with this medication and we will get you set up to see one of our clinical pharmacists.   If you need a refill on your cardiac medications before your next appointment, please call your pharmacy.   Follow-Up: At [facility], you and your health needs are our priority.  As part of our continuing mission to provide you with exceptional heart care, we have created designated Provider Care Teams.  These Care Teams include your primary Cardiologist (physician) and Advanced Practice Providers (APPs -  Physician Assistants and Nurse Practitioners) who all work together to provide you with the care you need, when you need it. You  will need a follow up appointment in 12 months.  Please call our office 2 months in advance to schedule this appointment.  You may see Dr. Royann Shiversroitoru or one of the following Advanced Practice Providers on your designated Care Team: Azalee CourseHao Meng, [place] . Micah FlesherAngela Duke, PA-C  Any Other Special Instructions Will Be Listed Below (If Applicable).       Signed, Thurmon FairMihai Arek Spadafore, MD  12/17/2018 1:52 PM    [facility] [address], [place], [place]  [address] Phone: [phone]; Fax: [phone]

## 2018-12-17 NOTE — Patient Instructions (Signed)
Medication Instructions:  Continue current medications  Dr. Royann Shiversroitoru recommends that you start REPATHA (injectable cholesterol medication) Please call our office if you wish to proceed with this medication and we will get you set up to see one of our clinical pharmacists.   If you need a refill on your cardiac medications before your next appointment, please call your pharmacy.   Follow-Up: At [facility], you and your health needs are our priority.  As part of our continuing mission to provide you with exceptional heart care, we have created designated Provider Care Teams.  These Care Teams include your primary Cardiologist (physician) and Advanced Practice Providers (APPs -  Physician Assistants and Nurse Practitioners) who all work together to provide you with the care you need, when you need it. You will need a follow up appointment in 12 months.  Please call our office 2 months in advance to schedule this appointment.  You may see Dr. Royann Shiversroitoru or one of the following Advanced Practice Providers on your designated Care Team: Azalee CourseHao Meng, [place] . Micah FlesherAngela Duke, PA-C  Any Other Special Instructions Will Be Listed Below (If Applicable).

## 2019-06-20 ENCOUNTER — Other Ambulatory Visit: Payer: Self-pay

## 2019-09-02 DIAGNOSIS — Z23 Encounter for immunization: Secondary | ICD-10-CM | POA: Diagnosis not present

## 2019-10-10 ENCOUNTER — Other Ambulatory Visit: Payer: Self-pay

## 2019-10-19 ENCOUNTER — Ambulatory Visit (INDEPENDENT_AMBULATORY_CARE_PROVIDER_SITE_OTHER): Payer: PPO

## 2019-10-19 ENCOUNTER — Ambulatory Visit (INDEPENDENT_AMBULATORY_CARE_PROVIDER_SITE_OTHER): Payer: PPO | Admitting: Family

## 2019-10-19 ENCOUNTER — Encounter: Payer: Self-pay | Admitting: Family

## 2019-10-19 ENCOUNTER — Other Ambulatory Visit: Payer: Self-pay

## 2019-10-19 VITALS — Ht 68.0 in | Wt 208.0 lb

## 2019-10-19 DIAGNOSIS — M25561 Pain in right knee: Secondary | ICD-10-CM

## 2019-10-19 MED ORDER — LIDOCAINE HCL 1 % IJ SOLN
5.0000 mL | INTRAMUSCULAR | Status: AC | PRN
  Administered 2019-10-19: 5 mL

## 2019-10-19 MED ORDER — METHYLPREDNISOLONE ACETATE 40 MG/ML IJ SUSP
40.0000 mg | INTRAMUSCULAR | Status: AC | PRN
  Administered 2019-10-19: 40 mg via INTRA_ARTICULAR

## 2019-10-19 NOTE — Progress Notes (Signed)
Office Visit Note   Patient: Madison Mosley           Date of Birth: 21-Dec-1950           MRN: [id number] Visit Date: 10/19/2019              Requested by: Merri Brunette, MD [address] [place],  [place] [address] PCP: Merri Brunette, MD  No chief complaint on file.     HPI: The patient is a 68 year old woman who is seen today for initial evaluation of right knee pain.  She has been having knee pain on the right for many months to years.  However over the last several weeks it has been getting much worse.  Her pain is primarily posteriorly.  She feels that this is caused her to limp when she walks she has a difficult time getting from a seated to a standing position due to the feeling that her knee will give way.  She is not had any falls does not have any locking or catching.  She feels she is unable to fully flex her knee due to swelling and pain she has some associated tightness in her calf no warmth  Assessment & Plan: Visit Diagnoses:  1. Right knee pain, unspecified chronicity     Plan: Depo-Medrol injection right knee.  Deferred aspiration at this time.  She will follow-up in the office as needed  Follow-Up Instructions: No follow-ups on file.   Right Knee Exam   Muscle Strength  The patient has normal right knee strength.  Tenderness  Right knee tenderness location: popliteal fossa.  Range of Motion  The patient has normal right knee ROM.  Tests  Varus: negative Valgus: negative  Other  Erythema: absent Swelling: mild      Patient is alert, oriented, no adenopathy, well-dressed, normal affect, normal respiratory effort.   Imaging: No results found. No images are attached to the encounter.  Labs: No results found for: HGBA1C, ESRSEDRATE, CRP, LABURIC, REPTSTATUS, GRAMSTAIN, CULT, LABORGA   No results found for: ALBUMIN, PREALBUMIN, LABURIC  No results found for: MG No results found for: VD25OH  No results found for: PREALBUMIN  No flowsheet data found.   There is no height or weight on file to calculate BMI.  Orders:  Orders Placed This Encounter  Procedures  . XR Knee 1-2 Views Right   No orders of the defined types were placed in this encounter.    Procedures: Large Joint Inj: R knee on 10/19/2019 10:20 AM Indications: pain Details: 18 G 1.5 in needle, anteromedial approach Medications: 5 mL lidocaine 1 %; 40 mg methylPREDNISolone acetate 40 MG/ML Consent was given by the patient.      Clinical Data: No additional findings.  ROS:  All other systems negative, except as noted in the HPI. Review of Systems  Constitutional: Negative for chills and fever.  Musculoskeletal: Positive for arthralgias and myalgias. Negative for joint swelling.    Objective: Vital Signs: There were no vitals taken for this visit.  Specialty Comments:  No specialty comments available.  PMFS History: Patient Active Problem List   Diagnosis Date Noted  . Atrophic vaginitis 06/18/2017  . Sprain of calcaneofibular ligament of left ankle 01/16/2017  . Obesity (BMI 30.0-34.9) 01/05/2015  . CAD S/P percutaneous coronary angioplasty 04/21/2013  . Hypercholesterolemia 04/21/2013  . Essential hypertension 04/21/2013   Past Medical History:  Diagnosis Date  . CAD (coronary artery disease)    stents  . Hyperlipidemia  Family History  Problem Relation Age of Onset  . COPD Mother   . Heart failure Mother   . Diabetes Mother   . Valvular heart disease Mother        mitral valve leakage  . Heart attack Father 6       multiple heart attacks  . Heart attack Sister 15  . CAD Brother 35  . Heart attack Maternal Grandfather   . Heart attack Paternal Grandfather        multiple hearts attacks  . Stroke Paternal Grandfather     Past Surgical History:  Procedure Laterality Date  . CARDIAC CATHETERIZATION  2001   Percutaneous revascularization precedure with angioplasty to the proximal LAD and first diagonal    . CESAREAN SECTION    . CHOLECYSTECTOMY    . PARTIAL HYSTERECTOMY    . TONSILLECTOMY  1960   Social History   Occupational History  . Not on file  Tobacco Use  . Smoking status: Never Smoker  . Smokeless tobacco: Never Used  Substance and Sexual Activity  . Alcohol use: No    Alcohol/week: 0.0 standard drinks  . Drug use: No  . Sexual activity: Not on file

## 2019-10-27 DIAGNOSIS — I10 Essential (primary) hypertension: Secondary | ICD-10-CM | POA: Diagnosis not present

## 2019-10-27 DIAGNOSIS — E7800 Pure hypercholesterolemia, unspecified: Secondary | ICD-10-CM | POA: Diagnosis not present

## 2019-10-28 ENCOUNTER — Ambulatory Visit (INDEPENDENT_AMBULATORY_CARE_PROVIDER_SITE_OTHER): Payer: PPO | Admitting: Family

## 2019-10-28 ENCOUNTER — Ambulatory Visit (INDEPENDENT_AMBULATORY_CARE_PROVIDER_SITE_OTHER): Payer: PPO

## 2019-10-28 ENCOUNTER — Other Ambulatory Visit: Payer: Self-pay

## 2019-10-28 ENCOUNTER — Encounter: Payer: Self-pay | Admitting: Family

## 2019-10-28 VITALS — Ht 68.0 in | Wt 208.0 lb

## 2019-10-28 DIAGNOSIS — M545 Low back pain: Secondary | ICD-10-CM

## 2019-10-28 DIAGNOSIS — M5410 Radiculopathy, site unspecified: Secondary | ICD-10-CM

## 2019-10-28 MED ORDER — PREDNISONE 50 MG PO TABS: ORAL_TABLET | ORAL | 0 refills | Status: DC

## 2019-10-28 NOTE — Progress Notes (Signed)
Office Visit Note   Patient: Madison Mosley           Date of Birth: 01/24/1951           MRN: [id number] Visit Date: 10/28/2019              Requested by: Merri Brunette, MD [address] [place],  [place] [address] PCP: Merri Brunette, MD  No chief complaint on file.     HPI: The patient is a 68 year old woman who returns today complaining of continued issues with her right lower extremity.  Yesterday she fell she was unable to walk and bear weight on her right lower extremity felt a twisting knotting painful sensation in the back of her right thigh down to her heel and ankle.  Complains of some locking of her knee this is associated with severe pain some giving way as she did not have any falls but felt she would fall down states the pain radiates from her thigh towards her ankle.  She was seen in the office December 2 of this year for similar issue which was primarily described as posterior knee pain.  At that time she received a Depo-Medrol injection of her knee that did not provide her with any relief.  Denies any back or hip pain.  No recent injuries or falls.  No history of back pain. No numbness, tingling or weakness.  Does relate the pain is made worse with prolonged sitting relief with standing and moving about. Feels she must use shopping cart for stability, doesn't relate relief with leaning over  Assessment & Plan: Visit Diagnoses:  1. Back pain with right-sided radiculopathy     Plan: suspect lumbosacral radiculopathy, will trial prednisone burst. Will call patient on Tuesday to check on her symptoms.   Follow-Up Instructions: No follow-ups on file.   Back Exam   Tenderness  The patient is experiencing no tenderness.   Range of Motion  The patient has normal back ROM.  Muscle Strength  The patient has normal back strength.  Tests  Straight leg raise right: positive Straight leg raise left: negative      Patient is alert, oriented, no  adenopathy, well-dressed, normal affect, normal respiratory effort. Faintly positive right straight leg raise.  Compartments soft. No palpable cords. No calf tenderness. No thigh tenderness.   Imaging: No results found. No images are attached to the encounter.  Labs: No results found for: HGBA1C, ESRSEDRATE, CRP, LABURIC, REPTSTATUS, GRAMSTAIN, CULT, LABORGA   No results found for: ALBUMIN, PREALBUMIN, LABURIC  No results found for: MG No results found for: VD25OH  No results found for: PREALBUMIN No flowsheet data found.   There is no height or weight on file to calculate BMI.  Orders:  Orders Placed This Encounter  Procedures  . XR Lumbar Spine 2-3 Views   No orders of the defined types were placed in this encounter.    Procedures: No procedures performed  Clinical Data: No additional findings.  ROS:  All other systems negative, except as noted in the HPI. Review of Systems  Constitutional: Negative for chills and fever.  Cardiovascular: Negative for leg swelling.  Musculoskeletal: Positive for gait problem and myalgias. Negative for back pain.  Neurological: Negative for weakness and numbness.    Objective: Vital Signs: There were no vitals taken for this visit.  Specialty Comments:  No specialty comments available.  PMFS History: Patient Active Problem List   Diagnosis Date Noted  . Atrophic vaginitis 06/18/2017  .  Sprain of calcaneofibular ligament of left ankle 01/16/2017  . Obesity (BMI 30.0-34.9) 01/05/2015  . CAD S/P percutaneous coronary angioplasty 04/21/2013  . Hypercholesterolemia 04/21/2013  . Essential hypertension 04/21/2013   Past Medical History:  Diagnosis Date  . CAD (coronary artery disease)    stents  . Hyperlipidemia     Family History  Problem Relation Age of Onset  . COPD Mother   . Heart failure Mother   . Diabetes Mother   . Valvular heart disease Mother        mitral valve leakage  . Heart attack Father 101        multiple heart attacks  . Heart attack Sister 35  . CAD Brother 74  . Heart attack Maternal Grandfather   . Heart attack Paternal Grandfather        multiple hearts attacks  . Stroke Paternal Grandfather     Past Surgical History:  Procedure Laterality Date  . CARDIAC CATHETERIZATION  2001   Percutaneous revascularization precedure with angioplasty to the proximal LAD and first diagonal   . CESAREAN SECTION    . CHOLECYSTECTOMY    . PARTIAL HYSTERECTOMY    . TONSILLECTOMY  1960   Social History   Occupational History  . Not on file  Tobacco Use  . Smoking status: Never Smoker  . Smokeless tobacco: Never Used  Substance and Sexual Activity  . Alcohol use: No    Alcohol/week: 0.0 standard drinks  . Drug use: No  . Sexual activity: Not on file

## 2019-11-01 ENCOUNTER — Other Ambulatory Visit: Payer: Self-pay | Admitting: Family

## 2019-11-01 DIAGNOSIS — Z0001 Encounter for general adult medical examination with abnormal findings: Secondary | ICD-10-CM | POA: Diagnosis not present

## 2019-11-01 DIAGNOSIS — K219 Gastro-esophageal reflux disease without esophagitis: Secondary | ICD-10-CM | POA: Diagnosis not present

## 2019-11-01 DIAGNOSIS — M5410 Radiculopathy, site unspecified: Secondary | ICD-10-CM

## 2019-11-01 DIAGNOSIS — I2510 Atherosclerotic heart disease of native coronary artery without angina pectoris: Secondary | ICD-10-CM | POA: Diagnosis not present

## 2019-11-01 DIAGNOSIS — E7800 Pure hypercholesterolemia, unspecified: Secondary | ICD-10-CM | POA: Diagnosis not present

## 2019-11-01 DIAGNOSIS — E782 Mixed hyperlipidemia: Secondary | ICD-10-CM | POA: Diagnosis not present

## 2019-11-01 DIAGNOSIS — Z0000 Encounter for general adult medical examination without abnormal findings: Secondary | ICD-10-CM | POA: Diagnosis not present

## 2019-11-01 DIAGNOSIS — Z9861 Coronary angioplasty status: Secondary | ICD-10-CM | POA: Diagnosis not present

## 2019-11-01 DIAGNOSIS — I10 Essential (primary) hypertension: Secondary | ICD-10-CM | POA: Diagnosis not present

## 2019-11-01 DIAGNOSIS — L239 Allergic contact dermatitis, unspecified cause: Secondary | ICD-10-CM | POA: Diagnosis not present

## 2019-11-01 MED ORDER — PREDNISONE 10 MG PO TABS: 10.0000 mg | ORAL_TABLET | Freq: Every day | ORAL | 0 refills | Status: DC

## 2019-11-16 DIAGNOSIS — G720 Drug-induced myopathy: Secondary | ICD-10-CM | POA: Insufficient documentation

## 2019-11-24 ENCOUNTER — Encounter: Payer: Self-pay | Admitting: Physical Medicine and Rehabilitation

## 2019-11-24 ENCOUNTER — Ambulatory Visit: Payer: Medicare Other | Admitting: Physical Medicine and Rehabilitation

## 2019-11-24 ENCOUNTER — Ambulatory Visit: Payer: Self-pay

## 2019-11-24 ENCOUNTER — Other Ambulatory Visit: Payer: Self-pay

## 2019-11-24 VITALS — BP 155/79 | HR 83

## 2019-11-24 DIAGNOSIS — M5416 Radiculopathy, lumbar region: Secondary | ICD-10-CM | POA: Diagnosis not present

## 2019-11-24 MED ORDER — METHYLPREDNISOLONE ACETATE 80 MG/ML IJ SUSP: 40.0000 mg | Freq: Once | INTRAMUSCULAR | Status: DC

## 2019-11-24 NOTE — Progress Notes (Signed)
Pt states pain in the back of the right leg that goes all the way down. Pt also states numbness and tingling in the right foot at times. Pt states symptoms started October 2020. Pt states pain is present all the time and nothing makes it worse. Pt states prednisone is helping with pain.   .Numeric Pain Rating Scale and Functional Assessment Average Pain 6   In the last MONTH (on 0-10 scale) has pain interfered with the following?  1. General activity like being  able to carry out your everyday physical activities such as walking, climbing stairs, carrying groceries, or moving a chair?  Rating(7)   +Driver, -BT, -Dye Allergies.

## 2019-12-07 DIAGNOSIS — Z1231 Encounter for screening mammogram for malignant neoplasm of breast: Secondary | ICD-10-CM | POA: Diagnosis not present

## 2019-12-09 DIAGNOSIS — H524 Presbyopia: Secondary | ICD-10-CM | POA: Diagnosis not present

## 2019-12-09 DIAGNOSIS — H353131 Nonexudative age-related macular degeneration, bilateral, early dry stage: Secondary | ICD-10-CM | POA: Diagnosis not present

## 2020-01-03 ENCOUNTER — Ambulatory Visit: Payer: PPO | Admitting: Cardiovascular Disease

## 2020-02-07 ENCOUNTER — Encounter: Payer: Self-pay | Admitting: Family

## 2020-02-07 ENCOUNTER — Ambulatory Visit (INDEPENDENT_AMBULATORY_CARE_PROVIDER_SITE_OTHER): Payer: Medicare Other | Admitting: Family

## 2020-02-07 ENCOUNTER — Other Ambulatory Visit: Payer: Self-pay

## 2020-02-07 VITALS — Ht 68.0 in | Wt 208.0 lb

## 2020-02-07 DIAGNOSIS — M5410 Radiculopathy, site unspecified: Secondary | ICD-10-CM

## 2020-02-07 NOTE — Progress Notes (Signed)
Office Visit Note   Patient: Madison Mosley           Date of Birth: September 07, 1951           MRN: [id number] Visit Date: 02/07/2020              Requested by: Deland Pretty, MD [address] [place],  [place] [address] PCP: Deland Pretty, MD  Chief Complaint  Patient presents with  . Right Leg - Pain      HPI: The patient is a 69 year old woman who presents today complaining of ongoing posterior leg pain this is shooting deep stabbing pain that radiates from her mid thigh down to her mid calf.  She states that her leg buckles often when she is walking she feels unstable in her walking pain at night difficulty getting comfortable cannot sleep on her sides.  She has been taking Tylenol PM as well as ibuprofen at night with minimal relief.  She did takea prednisone burst back in December some minimal relief she describes it as masking of her pain.  She did ultimately take another course of prednisone 10 mg which provided her with minimal relief is status post ESI with Dr. Ernestina Patches which provided her with no relief.  Assessment & Plan: Visit Diagnoses:  1. Back pain with right-sided radiculopathy     Plan: Proceed with MRI of her lumbar spine.  May need to refer her to spine surgery  Follow-Up Instructions: No follow-ups on file.   Back Exam   Tenderness  The patient is experiencing no tenderness.   Range of Motion  The patient has normal back ROM.  Muscle Strength  The patient has normal back strength.  Tests  Straight leg raise right: positive Straight leg raise left: negative      Patient is alert, oriented, no adenopathy, well-dressed, normal affect, normal respiratory effort.   Imaging: No results found. No images are attached to the encounter.  Labs: No results found for: HGBA1C, ESRSEDRATE, CRP, LABURIC, REPTSTATUS, GRAMSTAIN, CULT, LABORGA   No results found for: ALBUMIN, PREALBUMIN, LABURIC  No results found for: MG No results found  for: VD25OH  No results found for: PREALBUMIN No flowsheet data found.   Body mass index is 31.63 kg/m.  Orders:  Orders Placed This Encounter  Procedures  . MR Lumbar Spine w/o contrast   No orders of the defined types were placed in this encounter.    Procedures: No procedures performed  Clinical Data: No additional findings.  ROS:  All other systems negative, except as noted in the HPI. Review of Systems  Objective: Vital Signs: Ht 5\' 8"  (1.727 m)   Wt 208 lb (94.3 kg)   BMI 31.63 kg/m   Specialty Comments:  No specialty comments available.  PMFS History: Patient Active Problem List   Diagnosis Date Noted  . Statin myopathy 11/16/2019  . Atrophic vaginitis 06/18/2017  . Sprain of calcaneofibular ligament of left ankle 01/16/2017  . Obesity (BMI 30.0-34.9) 01/05/2015  . CAD S/P percutaneous coronary angioplasty 04/21/2013  . Hypercholesterolemia 04/21/2013  . Essential hypertension 04/21/2013   Past Medical History:  Diagnosis Date  . CAD (coronary artery disease)    stents  . Hyperlipidemia     Family History  Problem Relation Age of Onset  . COPD Mother   . Heart failure Mother   . Diabetes Mother   . Valvular heart disease Mother        mitral valve leakage  . Heart  attack Father 102       multiple heart attacks  . Heart attack Sister 25  . CAD Brother 94  . Heart attack Maternal Grandfather   . Heart attack Paternal Grandfather        multiple hearts attacks  . Stroke Paternal Grandfather     Past Surgical History:  Procedure Laterality Date  . CARDIAC CATHETERIZATION  2001   Percutaneous revascularization precedure with angioplasty to the proximal LAD and first diagonal   . CESAREAN SECTION    . CHOLECYSTECTOMY    . PARTIAL HYSTERECTOMY    . TONSILLECTOMY  1960   Social History   Occupational History  . Not on file  Tobacco Use  . Smoking status: Never Smoker  . Smokeless tobacco: Never Used  Substance and Sexual Activity  .  Alcohol use: No    Alcohol/week: 0.0 standard drinks  . Drug use: No  . Sexual activity: Not on file

## 2020-02-29 ENCOUNTER — Other Ambulatory Visit: Payer: Self-pay

## 2020-02-29 ENCOUNTER — Encounter: Payer: Self-pay | Admitting: Cardiovascular Disease

## 2020-02-29 ENCOUNTER — Ambulatory Visit (INDEPENDENT_AMBULATORY_CARE_PROVIDER_SITE_OTHER): Payer: Medicare Other | Admitting: Cardiovascular Disease

## 2020-02-29 VITALS — BP 134/77 | HR 73 | Ht 68.0 in | Wt 228.0 lb

## 2020-02-29 DIAGNOSIS — I10 Essential (primary) hypertension: Secondary | ICD-10-CM

## 2020-02-29 DIAGNOSIS — E7800 Pure hypercholesterolemia, unspecified: Secondary | ICD-10-CM | POA: Diagnosis not present

## 2020-02-29 DIAGNOSIS — I2510 Atherosclerotic heart disease of native coronary artery without angina pectoris: Secondary | ICD-10-CM | POA: Diagnosis not present

## 2020-02-29 NOTE — Progress Notes (Signed)
Cardiology Office Note    Date:  03/02/2020   ID:  Madison Mosley, DOB [birth year], MRN [id number]  PCP:  Merri Brunette, MD  Cardiologist:   Thurmon Fair, MD   Chief Complaint  Patient presents with  . Coronary Artery Disease  CAD  History of Present Illness:  Madison Mosley is a 69 y.o. female with early onset coronary artery disease (3 separate stent procedures 2001-2002) without need for revascularization in the last almost 20 years. Her last functional study was a normal nuclear stress test in February 2019.   The patient specifically denies any chest pain at rest exertion, dyspnea at rest or with exertion, orthopnea, paroxysmal nocturnal dyspnea, syncope, palpitations, focal neurological deficits, intermittent claudication, lower extremity edema, unexplained weight gain, cough, hemoptysis or wheezing.  She walks a mile a day, 4-6 days a week.  She has significant hyperlipidemia but unfortunately has been intolerant to most statins, Zetia and WelChol.  She is currently on pravastatin 20 mg daily and was unable to tolerate higher doses due to allergy.  She was enrolled in a clinical trial of an oral cholesterol-lowering drug but developed a rash.  She has never taken PCSK9 inh.  Her most recent lipid profile from December 2020 shows a total cholesterol 230, HDL 61, triglycerides 162, calculated LDL 137.  Past Medical History:  Diagnosis Date  . CAD (coronary artery disease)    stents  . Hyperlipidemia     Past Surgical History:  Procedure Laterality Date  . CARDIAC CATHETERIZATION  2001   Percutaneous revascularization precedure with angioplasty to the proximal LAD and first diagonal   . CESAREAN SECTION    . CHOLECYSTECTOMY    . PARTIAL HYSTERECTOMY    . TONSILLECTOMY  1960    Current Medications: Outpatient Medications Prior to Visit  Medication Sig Dispense Refill  . aspirin EC 81 MG tablet Take 1 tablet (81 mg total) by mouth daily. 90 tablet 3  .  metoprolol succinate (TOPROL-XL) 50 MG 24 hr tablet Take 25 mg by mouth daily.    . Multiple Vitamin (MULTIVITAMIN WITH MINERALS) TABS Take 1 tablet by mouth daily.    . Potassium (POTASSIMIN PO) Take 1 tablet by mouth daily.    . pravastatin (PRAVACHOL) 20 MG tablet Take 1 tablet by mouth daily.    Marland Kitchen triamcinolone cream (KENALOG) 0.1 % as needed.    . triamterene-hydrochlorothiazide (MAXZIDE-25) 37.5-25 MG per tablet Take 0.5 tablets by mouth daily.     . meloxicam (MOBIC) 15 MG tablet Take 1 tablet (15 mg total) by mouth daily. 30 tablet 3  . predniSONE (DELTASONE) 10 MG tablet Take 1 tablet (10 mg total) by mouth daily with breakfast. 30 tablet 0   Facility-Administered Medications Prior to Visit  Medication Dose Route Frequency Provider Last Rate Last Admin  . methylPREDNISolone acetate (DEPO-MEDROL) injection 40 mg  40 mg Other Once Tyrell Antonio, MD         Allergies:   Other, Crestor  [rosuvastatin calcium], Ezetimibe, Pitavastatin, Welchol  [colesevelam hcl], Zocor  [simvastatin], and Naproxen   Social History   Socioeconomic History  . Marital status: Married    Spouse name: Not on file  . Number of children: Not on file  . Years of education: Not on file  . Highest education level: Not on file  Occupational History  . Not on file  Tobacco Use  . Smoking status: Never Smoker  . Smokeless tobacco: Never Used  Substance and Sexual Activity  .  Alcohol use: No    Alcohol/week: 0.0 standard drinks  . Drug use: No  . Sexual activity: Not on file  Other Topics Concern  . Not on file  Social History Narrative  . Not on file   Social Determinants of Health   Financial Resource Strain:   . Difficulty of Paying Living Expenses:   Food Insecurity:   . Worried About Programme researcher, broadcasting/film/video in the Last Year:   . Barista in the Last Year:   Transportation Needs:   . Freight forwarder (Medical):   Marland Kitchen Lack of Transportation (Non-Medical):   Physical Activity:   .  Days of Exercise per Week:   . Minutes of Exercise per Session:   Stress:   . Feeling of Stress :   Social Connections:   . Frequency of Communication with Friends and Family:   . Frequency of Social Gatherings with Friends and Family:   . Attends Religious Services:   . Active Member of Clubs or Organizations:   . Attends Banker Meetings:   Marland Kitchen Marital Status:      Family History:  The patient's family history includes CAD (age of onset: 49) in her brother; COPD in her mother; Diabetes in her mother; Heart attack in her maternal grandfather and paternal grandfather; Heart attack (age of onset: 2) in her sister; Heart attack (age of onset: 59) in her father; Heart failure in her mother; Stroke in her paternal grandfather; Valvular heart disease in her mother.   ROS:   Please see the history of present illness.    ROS All other systems are reviewed and are negative.   PHYSICAL EXAM:   VS:  BP 134/77   Pulse 73   Ht 5\' 8"  (1.727 m)   Wt 228 lb (103.4 kg)   BMI 34.67 kg/m     General: Alert, oriented x3, no distress, mildly obese Head: no evidence of trauma, PERRL, EOMI, no exophtalmos or lid lag, no myxedema, no xanthelasma; normal ears, nose and oropharynx Neck: normal jugular venous pulsations and no hepatojugular reflux; brisk carotid pulses without delay and no carotid bruits Chest: clear to auscultation, no signs of consolidation by percussion or palpation, normal fremitus, symmetrical and full respiratory excursions Cardiovascular: normal position and quality of the apical impulse, regular rhythm, normal first and second heart sounds, no murmurs, rubs or gallops Abdomen: no tenderness or distention, no masses by palpation, no abnormal pulsatility or arterial bruits, normal bowel sounds, no hepatosplenomegaly Extremities: no clubbing, cyanosis or edema; 2+ radial, ulnar and brachial pulses bilaterally; 2+ right femoral, posterior tibial and dorsalis pedis pulses; 2+  left femoral, posterior tibial and dorsalis pedis pulses; no subclavian or femoral bruits Neurological: grossly nonfocal Psych: Normal mood and affect   Wt Readings from Last 3 Encounters:  02/29/20 228 lb (103.4 kg)  02/07/20 208 lb (94.3 kg)  10/28/19 208 lb (94.3 kg)      Studies/Labs Reviewed:   EKG:  EKG is ordered today.  If it shows normal sinus rhythm with minor nonspecific ST-T changes and a QTC of 425 ms.  Similar to previous tracings.  Recent Labs:  Lipid Panel  total cholesterol 211, LDL 98, triglycerides 153, HDL 55 (10/21/2018).   10/27/2019 Total cholesterol 230, HDL 61, triglycerides 162 Creatinine 0.74, normal liver function tests  ASSESSMENT:    1. Coronary artery disease involving native coronary artery of native heart without angina pectoris   2. Essential hypertension   3. Hypercholesterolemia  PLAN:  In order of problems listed above:  1. CAD: Remains asymptomatic, 20 years since her revascularization procedures.  She is physically active.  On aspirin, maximum tolerated dose of statin and beta-blockers. 2. HTN: Good control.  She reports that this morning her blood pressure is 120/80 on her home blood pressure monitor. 3. HLP: Again encouraged her to consider PCSK9 inhibitors to treat her hypercholesterolemia.  She has declined their use in the past due to the cost.  Today she told me she is concerned about the side effects after she researched them on the Internet.  I assured her that after prescribing them to multiple patients I am convinced that these medications have a very low side effect profile, as is also reported in clinical trials.  She will think about it some more, but it seems to me that she has already made up her mind not to use these medications.  I also offered a referral to the lipid clinic, which she again declined.    Medication Adjustments/Labs and Tests Ordered: Current medicines are reviewed at length with the patient today.   Concerns regarding medicines are outlined above.  Medication changes, Labs and Tests ordered today are listed in the Patient Instructions below. Patient Instructions  Medication Instructions:  No changes *If you need a refill on your cardiac medications before your next appointment, please call your pharmacy*   Lab Work: None ordered If you have labs (blood work) drawn today and your tests are completely normal, you will receive your results only by: Marland Kitchen MyChart Message (if you have MyChart) OR . A paper copy in the mail If you have any lab test that is abnormal or we need to change your treatment, we will call you to review the results.   Testing/Procedures: None ordered   Follow-Up: At [facility], you and your health needs are our priority.  As part of our continuing mission to provide you with exceptional heart care, we have created designated Provider Care Teams.  These Care Teams include your primary Cardiologist (physician) and Advanced Practice Providers (APPs -  Physician Assistants and Nurse Practitioners) who all work together to provide you with the care you need, when you need it.  We recommend signing up for the patient portal called "MyChart".  Sign up information is provided on this After Visit Summary.  MyChart is used to connect with patients for Virtual Visits (Telemedicine).  Patients are able to view lab/test results, encounter notes, upcoming appointments, etc.  Non-urgent messages can be sent to your provider as well.   To learn more about what you can do with MyChart, go to [email or web address].    Your next appointment:   12 month(s)  The format for your next appointment:   In Person  Provider:   You may see Sanda Klein, MD or one of the following Advanced Practice Providers on your designated Care Team:    Almyra Deforest, PA-C  Fabian Sharp, [place] or   Roby Lofts, PA-C      Signed, Sanda Klein, MD  03/02/2020 3:01 PM    [facility] [place], [place], [place]  [address] Phone: [phone]; Fax: [phone]

## 2020-02-29 NOTE — Patient Instructions (Signed)

## 2020-03-02 ENCOUNTER — Encounter: Payer: Self-pay | Admitting: Cardiovascular Disease

## 2020-03-14 ENCOUNTER — Other Ambulatory Visit: Payer: Self-pay

## 2020-03-14 ENCOUNTER — Ambulatory Visit
Admission: RE | Admit: 2020-03-14 | Discharge: 2020-03-14 | Disposition: A | Discharge status: Home / Self Care | Payer: Medicare Other | Source: Ambulatory Visit | Attending: Family | Admitting: Family

## 2020-03-14 DIAGNOSIS — M5410 Radiculopathy, site unspecified: Secondary | ICD-10-CM

## 2020-03-14 DIAGNOSIS — M545 Low back pain: Secondary | ICD-10-CM | POA: Diagnosis not present

## 2020-03-19 ENCOUNTER — Ambulatory Visit: Payer: Medicare Other | Admitting: Orthopedic Surgery

## 2020-03-19 NOTE — Procedures (Signed)
Lumbar Epidural Steroid Injection - Interlaminar Approach with Fluoroscopic Guidance  Patient: Madison Mosley      Date of Birth: September 03, 1951 MRN: [id number] PCP: Merri Brunette, MD      Visit Date: 11/24/2019   Universal Protocol:     Consent Given By: the patient  Position: PRONE  Additional Comments: Vital signs were monitored before and after the procedure. Patient was prepped and draped in the usual sterile fashion. The correct patient, procedure, and site was verified.   Injection Procedure Details:  Procedure Site One Meds Administered:  Meds ordered this encounter  Medications  . methylPREDNISolone acetate (DEPO-MEDROL) injection 40 mg     Laterality: Right  Location/Site:  L5-S1  Needle size: 20 G  Needle type: Tuohy  Needle Placement: Paramedian epidural  Findings:   -Comments: Excellent flow of contrast into the epidural space.  Procedure Details: Using a paramedian approach from the side mentioned above, the region overlying the inferior lamina was localized under fluoroscopic visualization and the soft tissues overlying this structure were infiltrated with 4 ml. of 1% Lidocaine without Epinephrine. The Tuohy needle was inserted into the epidural space using a paramedian approach.   The epidural space was localized using loss of resistance along with lateral and bi-planar fluoroscopic views.  After negative aspirate for air, blood, and CSF, a 2 ml. volume of Isovue-250 was injected into the epidural space and the flow of contrast was observed. Radiographs were obtained for documentation purposes.    The injectate was administered into the level noted above.   Additional Comments:  The patient tolerated the procedure well Dressing: 2 x 2 sterile gauze and Band-Aid    Post-procedure details: Patient was observed during the procedure. Post-procedure instructions were reviewed.  Patient left the clinic in stable condition.

## 2020-03-19 NOTE — Progress Notes (Signed)
   Madison Mosley - 69 y.o. female MRN [id number]  Date of birth: Apr 04, 1951  Office Visit Note: Visit Date: 11/24/2019 PCP: Merri Brunette, MD Referred by: Merri Brunette, MD  Subjective: Chief Complaint  Patient presents with  . Lower Back - Pain  . Right Leg - Pain  . Right Foot - Pain   HPI:  Madison Mosley is a 69 y.o. female who comes in today At the request of Barnie Del, FNP for planned Right L5-S1 lumbar epidural steroid injection with fluoroscopic guidance.  The patient has failed conservative care including home exercise, medications, time and activity modification.  This injection will be diagnostic and hopefully therapeutic.  Please see requesting physician notes for further details and justification.  If no help will need updated MRI.  Patient with no red flag symptoms or complaints or signs on exam.   ROS Otherwise per HPI.  Assessment & Plan: Visit Diagnoses:  1. Lumbar radiculopathy     Plan: No additional findings.   Meds & Orders:  Meds ordered this encounter  Medications  . methylPREDNISolone acetate (DEPO-MEDROL) injection 40 mg    Orders Placed This Encounter  Procedures  . XR C-ARM NO REPORT  . Epidural Steroid injection    Follow-up: Return if symptoms worsen or fail to improve.   Procedures: No procedures performed  Lumbar Epidural Steroid Injection - Interlaminar Approach with Fluoroscopic Guidance  Patient: Madison Mosley      Date of Birth: 1951/04/01 MRN: [id number] PCP: Merri Brunette, MD      Visit Date: 11/24/2019   Universal Protocol:     Consent Given By: the patient  Position: PRONE  Additional Comments: Vital signs were monitored before and after the procedure. Patient was prepped and draped in the usual sterile fashion. The correct patient, procedure, and site was verified.   Injection Procedure Details:  Procedure Site One Meds Administered:  Meds ordered this encounter  Medications  . methylPREDNISolone acetate  (DEPO-MEDROL) injection 40 mg     Laterality: Right  Location/Site:  L5-S1  Needle size: 20 G  Needle type: Tuohy  Needle Placement: Paramedian epidural  Findings:   -Comments: Excellent flow of contrast into the epidural space.  Procedure Details: Using a paramedian approach from the side mentioned above, the region overlying the inferior lamina was localized under fluoroscopic visualization and the soft tissues overlying this structure were infiltrated with 4 ml. of 1% Lidocaine without Epinephrine. The Tuohy needle was inserted into the epidural space using a paramedian approach.   The epidural space was localized using loss of resistance along with lateral and bi-planar fluoroscopic views.  After negative aspirate for air, blood, and CSF, a 2 ml. volume of Isovue-250 was injected into the epidural space and the flow of contrast was observed. Radiographs were obtained for documentation purposes.    The injectate was administered into the level noted above.   Additional Comments:  The patient tolerated the procedure well Dressing: 2 x 2 sterile gauze and Band-Aid    Post-procedure details: Patient was observed during the procedure. Post-procedure instructions were reviewed.  Patient left the clinic in stable condition.     Clinical History: No specialty comments available.     Objective:  VS:  HT:    WT:   BMI:     BP:(!) 155/79  HR:83bpm  TEMP: ( )  RESP:  Physical Exam  Ortho Exam Imaging: No results found.

## 2020-03-21 ENCOUNTER — Other Ambulatory Visit: Payer: Self-pay

## 2020-03-21 ENCOUNTER — Encounter: Payer: Self-pay | Admitting: Orthopaedic Surgery

## 2020-03-21 ENCOUNTER — Ambulatory Visit (INDEPENDENT_AMBULATORY_CARE_PROVIDER_SITE_OTHER): Payer: Medicare Other | Admitting: Orthopaedic Surgery

## 2020-03-21 VITALS — BP 135/70 | HR 72 | Ht 68.0 in | Wt 225.0 lb

## 2020-03-21 DIAGNOSIS — E669 Obesity, unspecified: Secondary | ICD-10-CM

## 2020-03-21 DIAGNOSIS — G8929 Other chronic pain: Secondary | ICD-10-CM | POA: Diagnosis not present

## 2020-03-21 DIAGNOSIS — M545 Low back pain, unspecified: Secondary | ICD-10-CM | POA: Insufficient documentation

## 2020-03-21 DIAGNOSIS — I10 Essential (primary) hypertension: Secondary | ICD-10-CM

## 2020-03-21 NOTE — Progress Notes (Signed)
Office Visit Note   Patient: Madison Mosley           Date of Birth: 28-Dec-1950           MRN: [id number] Visit Date: 03/21/2020              Requested by: Deland Pretty, MD [address] [place],  [place] [address] PCP: Deland Pretty, MD   Assessment & Plan: Visit Diagnoses:  1. Obesity (BMI 30.0-34.9)   2. Chronic right-sided low back pain without sciatica   3. Essential hypertension     Plan: Patient needs physical therapy referral for evaluation and treatment.  She has some right knee chondromalacia patella and also slight lateral recess narrowing at L4-5.  We discussed working on weight loss to help unload her back.  Patient states she counted every calorie as she had done in the past and did this for 2 months this winter but only lost 1 pound where previously she could lose weight easily.  We discussed gradual increase activity and resume counting calories again and I reassured her that with slight increase activity and monitoring intake she will get back to losing weight.  Weight loss and some strengthening of her core hips and thighs should take care of her problem.  Follow-Up Instructions: Return in about 5 weeks (around 04/25/2020).   Orders:  No orders of the defined types were placed in this encounter.  No orders of the defined types were placed in this encounter.     Procedures: No procedures performed   Clinical Data: No additional findings.   Subjective: Chief Complaint  Patient presents with   Lower Back - Pain    HPI 69 year old female with low back pain for greater than 4 months.  She has difficulty getting from sitting to standing when she is up she is able to walk better.  After she rides in a car she has difficulty getting out of a car and feels like her leg will give way on the right none on the left.  She took some prednisone gave her some improvement.  Epidural injection may have gave her some improvement for 1 week this was done on  11/24/2019 by Dr. Ernestina Patches.  No associated bowel bladder symptoms.  She states she is added about 15 pounds may be related with the prednisone.  Patient's had previous angioplasty.  BMI 34 with hypertension.  Review of Systems all other systems negative other than as mentioned in HPI.   Objective: Vital Signs: BP 135/70    Pulse 72    Ht 5\' 8"  (1.727 m)    Wt 225 lb (102.1 kg)    BMI 34.21 kg/m   Physical Exam Constitutional:      Appearance: She is well-developed.  HENT:     Head: Normocephalic.     Right Ear: External ear normal.     Left Ear: External ear normal.  Eyes:     Pupils: Pupils are equal, round, and reactive to light.  Neck:     Thyroid: No thyromegaly.     Trachea: No tracheal deviation.  Cardiovascular:     Rate and Rhythm: Normal rate.  Pulmonary:     Effort: Pulmonary effort is normal.  Abdominal:     Palpations: Abdomen is soft.  Skin:    General: Skin is warm and dry.  Neurological:     Mental Status: She is alert and oriented to person, place, and time.  Psychiatric:  Behavior: Behavior normal.     Ortho Exam patient is able to toe walk she has balance problems with heel walking but can heel walk if she holds onto the wall.  Negative straight leg raising 90 degrees.  Negative popliteal compression test.  Crepitus with knee extension.  Good quad strength no hip flexion weakness negative logroll the hips.  Mild sciatic notch tenderness on the right.  Exposed skin is normal.  Knee and ankle jerk are intact anterior tib EHL peroneals posterior tib are strong.  Patient slow getting from sitting to standing.  Hip flexors are strong.  Hip abductor and adductor are normal  Specialty Comments:  No specialty comments available.  Imaging: Plain radiographs and MRI scan was reviewed with the patient.  I gave her a copy of the MRI report of the lumbar spine.   PMFS History: Patient Active Problem List   Diagnosis Date Noted   Low back pain 03/21/2020    Statin myopathy 11/16/2019   Atrophic vaginitis 06/18/2017   Sprain of calcaneofibular ligament of left ankle 01/16/2017   Obesity (BMI 30.0-34.9) 01/05/2015   CAD S/P percutaneous coronary angioplasty 04/21/2013   Hypercholesterolemia 04/21/2013   Essential hypertension 04/21/2013   Past Medical History:  Diagnosis Date   CAD (coronary artery disease)    stents   Hyperlipidemia     Family History  Problem Relation Age of Onset   COPD Mother    Heart failure Mother    Diabetes Mother    Valvular heart disease Mother        mitral valve leakage   Heart attack Father 59       multiple heart attacks   Heart attack Sister 19   CAD Brother 93   Heart attack Maternal Grandfather    Heart attack Paternal Grandfather        multiple hearts attacks   Stroke Paternal Grandfather     Past Surgical History:  Procedure Laterality Date   CARDIAC CATHETERIZATION  2001   Percutaneous revascularization precedure with angioplasty to the proximal LAD and first diagonal    CESAREAN SECTION     CHOLECYSTECTOMY     PARTIAL HYSTERECTOMY     TONSILLECTOMY  1960   Social History   Occupational History   Not on file  Tobacco Use   Smoking status: Never Smoker   Smokeless tobacco: Never Used  Substance and Sexual Activity   Alcohol use: No    Alcohol/week: 0.0 standard drinks   Drug use: No   Sexual activity: Not on file

## 2020-03-21 NOTE — Addendum Note (Signed)
Addended by: Rogers Seeds on: 03/21/2020 09:12 AM   Modules accepted: Orders

## 2020-03-26 ENCOUNTER — Ambulatory Visit (INDEPENDENT_AMBULATORY_CARE_PROVIDER_SITE_OTHER): Payer: Medicare Other | Admitting: Rehabilitative and Restorative Service Providers"

## 2020-03-26 ENCOUNTER — Encounter: Payer: Self-pay | Admitting: Rehabilitative and Restorative Service Providers"

## 2020-03-26 ENCOUNTER — Other Ambulatory Visit: Payer: Self-pay

## 2020-03-26 DIAGNOSIS — M545 Low back pain, unspecified: Secondary | ICD-10-CM

## 2020-03-26 DIAGNOSIS — G8929 Other chronic pain: Secondary | ICD-10-CM | POA: Diagnosis not present

## 2020-03-26 DIAGNOSIS — R262 Difficulty in walking, not elsewhere classified: Secondary | ICD-10-CM

## 2020-03-26 DIAGNOSIS — M25561 Pain in right knee: Secondary | ICD-10-CM | POA: Diagnosis not present

## 2020-03-26 NOTE — Patient Instructions (Signed)
Access Code: [email or web address] URL: [email or web address] Date: 03/26/2020 Prepared by: Pauletta Browns  Exercises Standing Scapular Retraction - 5 x daily - 7 x weekly - 1 sets - 5 reps - 5 hold Standing Lumbar Extension at Wall - Forearms - 1 x daily - 7 x weekly - 5 sets - 5 reps - 3 hold Supine Quadricep Sets - 2 x daily - 7 x weekly - 1 sets - 15-25 reps - 5 hold Sit to Stand without Arm Support - 2 x daily - 7 x weekly - 1 sets - 5 reps

## 2020-03-26 NOTE — Therapy (Signed)
[facility] [address] [place], [place], [address] Phone: [phone]   Fax:  [phone]  Physical Therapy Evaluation  Patient Details  Name: Madison Mosley MRN: [id number] Date of Birth: Sep 17, 1951 Referring Provider (PT): Eldred Manges   Encounter Date: 03/26/2020  PT End of Session - 03/26/20 1140    Visit Number  1    Number of Visits  12    Date for PT Re-Evaluation  05/21/20    PT Start Time  1045    PT Stop Time  1131    PT Time Calculation (min)  46 min    Activity Tolerance  Patient tolerated treatment well       Past Medical History:  Diagnosis Date  . CAD (coronary artery disease)    stents  . Hyperlipidemia     Past Surgical History:  Procedure Laterality Date  . CARDIAC CATHETERIZATION  2001   Percutaneous revascularization precedure with angioplasty to the proximal LAD and first diagonal   . CESAREAN SECTION    . CHOLECYSTECTOMY    . PARTIAL HYSTERECTOMY    . TONSILLECTOMY  1960    There were no vitals filed for this visit.   Subjective Assessment - 03/26/20 1055    Subjective  Lyrical's biggest concern is being able to move better after prolonged sitting.  IE not feel as stiff or unstable with the knee or back.    Limitations  Standing;Walking    How long can you sit comfortably?  Not limited (NL)    How long can you stand comfortably?  30 minutes    How long can you walk comfortably?  30 minutes    Patient Stated Goals  See above    Currently in Pain?  Yes    Pain Score  2     Pain Location  Knee    Pain Orientation  Right    Pain Descriptors / Indicators  Aching    Pain Type  Chronic pain    Pain Radiating Towards  No sciatica    Pain Onset  More than a month ago    Pain Frequency  Intermittent    Aggravating Factors   Standing after prolonged sitting    Pain Relieving Factors  No    Effect of Pain on Daily Activities  Affects standing after prolonged sitting         [facility] PT Assessment -  03/26/20 0001      Assessment   Medical Diagnosis  R sided low back and R knee pain    Referring Provider (PT)  Annell Greening C    Onset Date/Surgical Date  02/16/20      Balance Screen   Has the patient fallen in the past 6 months  No    Has the patient had a decrease in activity level because of a fear of falling?   No    Is the patient reluctant to leave their home because of a fear of falling?   No      Home Environment   Living Environment  Private residence      Cognition   Overall Cognitive Status  Within Functional Limits for tasks assessed      Functional Tests   Functional tests  Sit to Stand   Needs UE support due to R knee pain     Posture/Postural Control   Posture/Postural Control  Postural limitations    Postural Limitations  Forward head;Rounded Shoulders;Decreased lumbar lordosis  ROM / Strength   AROM / PROM / Strength  AROM;Strength      AROM   Overall AROM   Deficits    AROM Assessment Site  Lumbar;Knee    Right/Left Knee  Right;Left    Right Knee Extension  0    Right Knee Flexion  120    Left Knee Extension  0    Left Knee Flexion  120    Lumbar Extension  10      Strength   Overall Strength  Deficits    Strength Assessment Site  Lumbar;Knee    Right/Left Knee  Left;Right    Right Knee Extension  3/5    Left Knee Extension  4+/5    Lumbar Extension  3/5                Objective measurements completed on examination: See above findings.              PT Education - 03/26/20 1139    Education Details  Discussed importance of posture, consistent HEP compliance and consistent attendance in PT to meet LTGs.    Person(s) Educated  Patient    Methods  Explanation;Demonstration;Tactile cues;Verbal cues;Handout    Comprehension  Verbalized understanding;Tactile cues required;Returned demonstration;Need further instruction;Verbal cues required       PT Short Term Goals - 03/26/20 1306      PT SHORT TERM GOAL #1   Title   Madison Mosley will be independent with her starter HEP within 2 weeks.    Time  2    Period  Weeks    Status  New    Target Date  04/09/20        PT Long Term Goals - 03/26/20 1307      PT LONG TERM GOAL #1   Title  Madison Mosley will report low back and R knee pain consistently < 3/10 on the Numeric Pain rating Scale.    Time  8    Period  Weeks    Status  New    Target Date  05/21/20      PT LONG TERM GOAL #2   Title  Madison Mosley will be able to stand and walk with minimal soreness/stiffness and participate more fully in her ADLs.    Time  8    Period  Weeks    Status  New    Target Date  05/21/20      PT LONG TERM GOAL #3   Title  Madison Mosley will improve her spine and leg strength as assessed by improved MMT scores and functional self-report.    Time  8    Period  Weeks    Status  New    Target Date  05/21/20      PT LONG TERM GOAL #4   Title  Madison Mosley will be independent with her long-term DC HEP.    Time  8    Period  Weeks    Status  New    Target Date  05/21/20             Plan - 03/26/20 1142    Clinical Impression Statement  Madison Mosley is generally deconditioned.  Postural and quadriceps weakness are particularly evident.  She has a home walking program which will be continued in addition to postural and general LE strengthening (quadriceps emphasis) to allow Madison Mosley to stand from sitting and trust her knee and back will not hurt and limit the first several steps.    Personal Factors and  Comorbidities  Comorbidity 1;Comorbidity 2    Comorbidities  Cardiac history including 3 stents and cardiac catheterization    Examination-Activity Limitations  Bathing;Dressing;Transfers;Bed Mobility;Bend;Lift;Squat;Stairs    Examination-Participation Restrictions  Church;Interpersonal Relationship;Cleaning;Laundry;Shop;Community Activity    Stability/Clinical Decision Making  Stable/Uncomplicated    Clinical Decision Making  Low    Rehab Potential  Good    PT Frequency  3x / week    PT Duration  4  weeks    PT Treatment/Interventions  ADLs/Self Care Home Management;Moist Heat;Electrical Stimulation;Cryotherapy;Therapeutic activities;Functional mobility training;Stair training;Gait training;Therapeutic exercise;Balance training;Neuromuscular re-education;Patient/family education    PT Next Visit Plan  General spine and leg strength work along with body mechanics and general conditioning    PT Home Exercise Plan  See patient instructions    Consulted and Agree with Plan of Care  Patient       Patient will benefit from skilled therapeutic intervention in order to improve the following deficits and impairments:  Abnormal gait, Decreased activity tolerance, Decreased endurance, Decreased range of motion, Decreased mobility, Decreased strength, Impaired flexibility, Pain, Obesity  Visit Diagnosis: Chronic right-sided low back pain without sciatica  Chronic pain of right knee  Difficulty in walking, not elsewhere classified     Problem List Patient Active Problem List   Diagnosis Date Noted  . Low back pain 03/21/2020  . Statin myopathy 11/16/2019  . Atrophic vaginitis 06/18/2017  . Sprain of calcaneofibular ligament of left ankle 01/16/2017  . Obesity (BMI 30.0-34.9) 01/05/2015  . CAD S/P percutaneous coronary angioplasty 04/21/2013  . Hypercholesterolemia 04/21/2013  . Essential hypertension 04/21/2013    Farley Ly PT, MPT 03/26/2020, 1:13 PM  [facility] [address] [place], [place], [address] Phone: [phone]   Fax:  [phone]  Name: Madison Mosley MRN: [id number] Date of Birth: March 05, 1951

## 2020-03-28 ENCOUNTER — Other Ambulatory Visit: Payer: Self-pay

## 2020-03-28 ENCOUNTER — Ambulatory Visit (INDEPENDENT_AMBULATORY_CARE_PROVIDER_SITE_OTHER): Payer: Medicare Other | Admitting: Physical Therapy

## 2020-03-28 ENCOUNTER — Encounter: Payer: Self-pay | Admitting: Physical Therapy

## 2020-03-28 DIAGNOSIS — R262 Difficulty in walking, not elsewhere classified: Secondary | ICD-10-CM | POA: Diagnosis not present

## 2020-03-28 DIAGNOSIS — G8929 Other chronic pain: Secondary | ICD-10-CM | POA: Diagnosis not present

## 2020-03-28 DIAGNOSIS — M25561 Pain in right knee: Secondary | ICD-10-CM

## 2020-03-28 DIAGNOSIS — M545 Low back pain: Secondary | ICD-10-CM

## 2020-03-28 NOTE — Therapy (Signed)
[facility] [address] [place], [place], [address] Phone: [phone]   Fax:  [phone]  Physical Therapy Treatment  Patient Details  Name: Madison Mosley MRN: [id number] Date of Birth: July 22, 1951 Referring Provider (PT): Marybelle Killings   Encounter Date: 03/28/2020  PT End of Session - 03/28/20 1536    Visit Number  2    Number of Visits  12    Date for PT Re-Evaluation  05/21/20    PT Start Time  1430    PT Stop Time  1514    PT Time Calculation (min)  44 min    Activity Tolerance  Patient tolerated treatment well    Behavior During Therapy  [facility] for tasks assessed/performed       Past Medical History:  Diagnosis Date  . CAD (coronary artery disease)    stents  . Hyperlipidemia     Past Surgical History:  Procedure Laterality Date  . CARDIAC CATHETERIZATION  2001   Percutaneous revascularization precedure with angioplasty to the proximal LAD and first diagonal   . CESAREAN SECTION    . CHOLECYSTECTOMY    . PARTIAL HYSTERECTOMY    . TONSILLECTOMY  1960    There were no vitals filed for this visit.  Subjective Assessment - 03/28/20 1505    Subjective  relays that the pain is not bad when she is walking or standing but after she has prolonged sitting or laying her back and Rt leg throb. She also felt some "zings" going down her leg while driving today    Limitations  Standing;Walking    How long can you sit comfortably?  Not limited (NL)    How long can you stand comfortably?  30 minutes    How long can you walk comfortably?  30 minutes    Patient Stated Goals  See above    Pain Onset  More than a month ago        [facility] Adult PT Treatment/Exercise - 03/28/20 0001      Exercises   Exercises  Knee/Hip      Knee/Hip Exercises: Stretches   Active Hamstring Stretch  Right;3 reps;30 seconds    Piriformis Stretch  Right;3 reps;30 seconds      Knee/Hip Exercises: Aerobic   Nustep  L4 X 6 min UE/LE      Knee/Hip  Exercises: Standing   Forward Step Up  Right;15 reps;Hand Hold: 0;Step Height: 6"    Other Standing Knee Exercises  standing lumbar extensions 2X10, standing scapular retraction 5 sec  x15, bilat shoulder ER yellow band X15      Knee/Hip Exercises: Seated   Long Arc Quad  Both;15 reps    Long Arc Quad Weight  3 lbs.    Sit to Sand  2 sets;5 reps;without UE support   slow eccentric lower      Knee/Hip Exercises: Supine   Bridges  15 reps    Bridges Limitations  hold 5 sec    Other Supine Knee/Hip Exercises  clams green X 15      Manual Therapy   Manual therapy comments  Rt leg long axis distraction, manual hamstring and gastroc stretching Rt leg             PT Education - 03/28/20 1534    Education Details  HEP review    Person(s) Educated  Patient    Methods  Explanation;Demonstration    Comprehension  Verbalized understanding       PT Short Term  Goals - 03/26/20 1306      PT SHORT TERM GOAL #1   Title  [name] will be independent with her starter HEP within 2 weeks.    Time  2    Period  Weeks    Status  New    Target Date  04/09/20        PT Long Term Goals - 03/26/20 1307      PT LONG TERM GOAL #1   Title  [name] will report low back and R knee pain consistently < 3/10 on the Numeric Pain rating Scale.    Time  8    Period  Weeks    Status  New    Target Date  05/21/20      PT LONG TERM GOAL #2   Title  [name] will be able to stand and walk with minimal soreness/stiffness and participate more fully in her ADLs.    Time  8    Period  Weeks    Status  New    Target Date  05/21/20      PT LONG TERM GOAL #3   Title  [name] will improve her spine and leg strength as assessed by improved MMT scores and functional self-report.    Time  8    Period  Weeks    Status  New    Target Date  05/21/20      PT LONG TERM GOAL #4   Title  [name] will be independent with her long-term DC HEP.    Time  8    Period  Weeks    Status  New    Target Date  05/21/20             Plan - 03/28/20 1537    Clinical Impression Statement  Session focused on HEP review and she shows good understanding and proper technique. Then focused on stretching and strengthening as tolerated for her Rt leg and lumbar with good overall tolreance. PT will continue to progress as tolerated.    Personal Factors and Comorbidities  Comorbidity 1;Comorbidity 2    Comorbidities  Cardiac history including 3 stents and cardiac catheterization    Examination-Activity Limitations  Bathing;Dressing;Transfers;Bed Mobility;Bend;Lift;Squat;Stairs    Examination-Participation Restrictions  Church;Interpersonal Relationship;Cleaning;Laundry;Shop;Community Activity    Stability/Clinical Decision Making  Stable/Uncomplicated    Rehab Potential  Good    PT Frequency  3x / week    PT Duration  4 weeks    PT Treatment/Interventions  ADLs/Self Care Home Management;Moist Heat;Electrical Stimulation;Cryotherapy;Therapeutic activities;Functional mobility training;Stair training;Gait training;Therapeutic exercise;Balance training;Neuromuscular re-education;Patient/family education    PT Next Visit Plan  General spine and leg strength work along with body mechanics and general conditioning    PT Home Exercise Plan  See patient instructions    Consulted and Agree with Plan of Care  Patient       Patient will benefit from skilled therapeutic intervention in order to improve the following deficits and impairments:  Abnormal gait, Decreased activity tolerance, Decreased endurance, Decreased range of motion, Decreased mobility, Decreased strength, Impaired flexibility, Pain, Obesity  Visit Diagnosis: Chronic right-sided low back pain without sciatica  Chronic pain of right knee  Difficulty in walking, not elsewhere classified     Problem List Patient Active Problem List   Diagnosis Date Noted  . Low back pain 03/21/2020  . Statin myopathy 11/16/2019  . Atrophic vaginitis 06/18/2017  . Sprain  of calcaneofibular ligament of left ankle 01/16/2017  . Obesity (BMI 30.0-34.9) 01/05/2015  .  CAD S/P percutaneous coronary angioplasty 04/21/2013  . Hypercholesterolemia 04/21/2013  . Essential hypertension 04/21/2013    Birdie Riddle 03/28/2020, 3:39 PM  [facility] [address] [place], [place], [address] Phone: [phone]   Fax:  [phone]  Name: MAUREENA DABBS MRN: [id number] Date of Birth: 06-07-1951

## 2020-04-10 ENCOUNTER — Encounter: Payer: Self-pay | Admitting: Physical Therapy

## 2020-04-10 ENCOUNTER — Ambulatory Visit (INDEPENDENT_AMBULATORY_CARE_PROVIDER_SITE_OTHER): Payer: Medicare Other | Admitting: Physical Therapy

## 2020-04-10 ENCOUNTER — Other Ambulatory Visit: Payer: Self-pay

## 2020-04-10 DIAGNOSIS — R262 Difficulty in walking, not elsewhere classified: Secondary | ICD-10-CM

## 2020-04-10 DIAGNOSIS — M545 Low back pain: Secondary | ICD-10-CM

## 2020-04-10 DIAGNOSIS — M25561 Pain in right knee: Secondary | ICD-10-CM | POA: Diagnosis not present

## 2020-04-10 DIAGNOSIS — G8929 Other chronic pain: Secondary | ICD-10-CM | POA: Diagnosis not present

## 2020-04-10 NOTE — Therapy (Signed)
[facility] Physical Therapy [address] [place], [place], [address] Phone: [phone]   Fax:  [phone]  Physical Therapy Treatment  Patient Details  Name: Madison Mosley MRN: [id number] Date of Birth: [birth year] Referring Provider (PT): Marybelle Killings   Encounter Date: 04/10/2020  PT End of Session - 04/10/20 1119    Visit Number  3    Number of Visits  12    Date for PT Re-Evaluation  05/21/20    PT Start Time  9528    PT Stop Time  1104    PT Time Calculation (min)  49 min    Activity Tolerance  Patient tolerated treatment well    Behavior During Therapy  [facility] for tasks assessed/performed       Past Medical History:  Diagnosis Date  . CAD (coronary artery disease)    stents  . Hyperlipidemia     Past Surgical History:  Procedure Laterality Date  . CARDIAC CATHETERIZATION  2001   Percutaneous revascularization precedure with angioplasty to the proximal LAD and first diagonal   . CESAREAN SECTION    . CHOLECYSTECTOMY    . PARTIAL HYSTERECTOMY    . TONSILLECTOMY  1960    There were no vitals filed for this visit.  Subjective Assessment - 04/10/20 1020    Subjective  Relays most of the pain is behind her Rt knee, she cant give this a number but states "its just there about all the time"    Limitations  Standing;Walking    How long can you sit comfortably?  Not limited (NL)    How long can you stand comfortably?  30 minutes    How long can you walk comfortably?  30 minutes    Patient Stated Goals  See above    Pain Onset  More than a month ago         [facility] Adult PT Treatment/Exercise - 04/10/20 0001      Knee/Hip Exercises: Stretches   Active Hamstring Stretch  Right;3 reps;30 seconds    Piriformis Stretch  30 seconds;Both;2 reps    Other Knee/Hip Stretches  slantboard stretch 30 sec X 3 for gastroc and X 3 for soleus      Knee/Hip Exercises: Aerobic   Nustep  L5 X 8 min UE/LE      Knee/Hip Exercises: Machines for Strengthening    Total Gym Leg Press  75 lbs 3X10 bilat push      Knee/Hip Exercises: Standing   Heel Raises Limitations  heel toe raises X 15 reps ea    Knee Flexion  Both;20 reps    Knee Flexion Limitations  3 lbs    Hip Abduction  Both;20 reps    Abduction Limitations  3 lbs    Hip Extension  Both;20 reps    Extension Limitations  3 lbs    Other Standing Knee Exercises  standing rows green X 20, bilat shoulder extensions green X 20      Knee/Hip Exercises: Seated   Sit to Sand  2 sets;5 reps;without UE support      Manual Therapy   Manual therapy comments  STM/IASTM to Rt hamstrings and calves in prone lying             PT Education - 04/10/20 1118    Education Details  self STM techniques to perform at home to hamstrings    Person(s) Educated  Patient    Methods  Explanation;Demonstration;Verbal cues    Comprehension  Verbalized understanding  PT Short Term Goals - 03/26/20 1306      PT SHORT TERM GOAL #1   Title  Madison Mosley will be independent with her starter HEP within 2 weeks.    Time  2    Period  Weeks    Status  New    Target Date  04/09/20        PT Long Term Goals - 03/26/20 1307      PT LONG TERM GOAL #1   Title  Madison Mosley will report low back and R knee pain consistently < 3/10 on the Numeric Pain rating Scale.    Time  8    Period  Weeks    Status  New    Target Date  05/21/20      PT LONG TERM GOAL #2   Title  Madison Mosley will be able to stand and walk with minimal soreness/stiffness and participate more fully in her ADLs.    Time  8    Period  Weeks    Status  New    Target Date  05/21/20      PT LONG TERM GOAL #3   Title  Madison Mosley will improve her spine and leg strength as assessed by improved MMT scores and functional self-report.    Time  8    Period  Weeks    Status  New    Target Date  05/21/20      PT LONG TERM GOAL #4   Title  Madison Mosley will be independent with her long-term DC HEP.    Time  8    Period  Weeks    Status  New    Target Date  05/21/20             Plan - 04/10/20 1120    Clinical Impression Statement  Able to progress overall strength program with good tolerance but does complain of a catching/pinching pain in the back of her knee. She was treated with manual therapy for STM/IASTM to her hamstings and calves which were found to be tight. After this she was able to stand up with less pinching sensations in her posterior knee. She was shown self STM techniques to try at home.    Personal Factors and Comorbidities  Comorbidity 1;Comorbidity 2    Comorbidities  Cardiac history including 3 stents and cardiac catheterization    Examination-Activity Limitations  Bathing;Dressing;Transfers;Bed Mobility;Bend;Lift;Squat;Stairs    Examination-Participation Restrictions  Church;Interpersonal Relationship;Cleaning;Laundry;Shop;Community Activity    Stability/Clinical Decision Making  Stable/Uncomplicated    Rehab Potential  Good    PT Frequency  3x / week    PT Duration  4 weeks    PT Treatment/Interventions  ADLs/Self Care Home Management;Moist Heat;Electrical Stimulation;Cryotherapy;Therapeutic activities;Functional mobility training;Stair training;Gait training;Therapeutic exercise;Balance training;Neuromuscular re-education;Patient/family education    PT Next Visit Plan  General spine and leg strength work along with body mechanics and general conditioning, stretching and manual to Rt hamstrings    PT Home Exercise Plan  See patient instructions    Consulted and Agree with Plan of Care  Patient       Patient will benefit from skilled therapeutic intervention in order to improve the following deficits and impairments:  Abnormal gait, Decreased activity tolerance, Decreased endurance, Decreased range of motion, Decreased mobility, Decreased strength, Impaired flexibility, Pain, Obesity  Visit Diagnosis: Chronic right-sided low back pain without sciatica  Chronic pain of right knee  Difficulty in walking, not elsewhere  classified     Problem List Patient Active Problem List  Diagnosis Date Noted  . Low back pain 03/21/2020  . Statin myopathy 11/16/2019  . Atrophic vaginitis 06/18/2017  . Sprain of calcaneofibular ligament of left ankle 01/16/2017  . Obesity (BMI 30.0-34.9) 01/05/2015  . CAD S/P percutaneous coronary angioplasty 04/21/2013  . Hypercholesterolemia 04/21/2013  . Essential hypertension 04/21/2013    Birdie Riddle 04/10/2020, 11:22 AM  [facility] Physical Therapy [address] [place], [place], [address] Phone: [phone]   Fax:  [phone]  Name: Madison Mosley MRN: [id number] Date of Birth: Dec 25, 1950

## 2020-04-12 ENCOUNTER — Ambulatory Visit (INDEPENDENT_AMBULATORY_CARE_PROVIDER_SITE_OTHER): Payer: Medicare Other | Admitting: Physical Therapy

## 2020-04-12 ENCOUNTER — Other Ambulatory Visit: Payer: Self-pay

## 2020-04-12 DIAGNOSIS — G8929 Other chronic pain: Secondary | ICD-10-CM

## 2020-04-12 DIAGNOSIS — R262 Difficulty in walking, not elsewhere classified: Secondary | ICD-10-CM

## 2020-04-12 DIAGNOSIS — M25561 Pain in right knee: Secondary | ICD-10-CM | POA: Diagnosis not present

## 2020-04-12 DIAGNOSIS — M545 Low back pain: Secondary | ICD-10-CM

## 2020-04-12 NOTE — Therapy (Signed)
[facility] Physical Therapy [address] [place], [place], [address] Phone: [phone]   Fax:  [phone]  Physical Therapy Treatment  Patient Details  Name: Madison Mosley MRN: [id number] Date of Birth: 14-Dec-1950 Referring Provider (PT): Marybelle Killings   Encounter Date: 04/12/2020  PT End of Session - 04/12/20 1853    Visit Number  4    Number of Visits  12    Date for PT Re-Evaluation  05/21/20    PT Start Time  1435    PT Stop Time  1515    PT Time Calculation (min)  40 min    Activity Tolerance  Patient tolerated treatment well    Behavior During Therapy  [facility] for tasks assessed/performed       Past Medical History:  Diagnosis Date  . CAD (coronary artery disease)    stents  . Hyperlipidemia     Past Surgical History:  Procedure Laterality Date  . CARDIAC CATHETERIZATION  2001   Percutaneous revascularization precedure with angioplasty to the proximal LAD and first diagonal   . CESAREAN SECTION    . CHOLECYSTECTOMY    . PARTIAL HYSTERECTOMY    . TONSILLECTOMY  1960    There were no vitals filed for this visit.  Subjective Assessment - 04/12/20 1849    Subjective  Still with pain down her Rt leg, just feel like it catches on me. Overall 5/10    Limitations  Standing;Walking    How long can you sit comfortably?  Not limited (NL)    How long can you stand comfortably?  30 minutes    How long can you walk comfortably?  30 minutes    Patient Stated Goals  See above    Pain Onset  More than a month ago            [facility] Adult PT Treatment/Exercise - 04/12/20 0001      Knee/Hip Exercises: Stretches   Active Hamstring Stretch  Right;Left;3 reps;30 seconds    Piriformis Stretch  Right;3 reps;30 seconds    Other Knee/Hip Stretches  slantboard stretch 30 sec X4       Knee/Hip Exercises: Aerobic   Recumbent Bike  6 min L2      Knee/Hip Exercises: Machines for Strengthening   Cybex Knee Extension  5 lbs 2X15     Cybex Knee Flexion   35 lbs 2X15    Total Gym Leg Press  75 lbs 3X10 bilat push      Knee/Hip Exercises: Standing   Heel Raises Limitations  heel toe raises X 15 reps ea      Knee/Hip Exercises: Seated   Sit to Sand  2 sets;5 reps;without UE support      Manual Therapy   Manual therapy comments  STM/IASTM to Rt hamstrings and calves in prone lying               PT Short Term Goals - 04/12/20 1856      PT SHORT TERM GOAL #1   Title  [name] will be independent with her starter HEP within 2 weeks.    Time  2    Period  Weeks    Status  Achieved    Target Date  04/09/20        PT Long Term Goals - 04/12/20 1856      PT LONG TERM GOAL #1   Title  [name] will report low back and R knee pain consistently < 3/10 on the Numeric Pain  rating Scale.    Baseline  5    Time  8    Period  Weeks    Status  On-going      PT LONG TERM GOAL #2   Title  [name] will be able to stand and walk with minimal soreness/stiffness and participate more fully in her ADLs.    Baseline  still soreness and catching in Rt posterior leg    Time  8    Period  Weeks    Status  On-going      PT LONG TERM GOAL #3   Title  [name] will improve her spine and leg strength as assessed by improved MMT scores and functional self-report.    Time  8    Period  Weeks    Status  On-going      PT LONG TERM GOAL #4   Title  [name] will be independent with her long-term DC HEP.    Time  8    Period  Weeks    Status  On-going            Plan - 04/12/20 1854    Clinical Impression Statement  Continued with manual therapy to Rt H.S. and gastroc as she has significant tightness and Soft tissue restrictions. Then able to incorporate resistance training with weight machines with good overall activity tolerance. Continue POC    Personal Factors and Comorbidities  Comorbidity 1;Comorbidity 2    Comorbidities  Cardiac history including 3 stents and cardiac catheterization    Examination-Activity Limitations   Bathing;Dressing;Transfers;Bed Mobility;Bend;Lift;Squat;Stairs    Examination-Participation Restrictions  Church;Interpersonal Relationship;Cleaning;Laundry;Shop;Community Activity    Stability/Clinical Decision Making  Stable/Uncomplicated    Rehab Potential  Good    PT Frequency  3x / week    PT Duration  4 weeks    PT Treatment/Interventions  ADLs/Self Care Home Management;Moist Heat;Electrical Stimulation;Cryotherapy;Therapeutic activities;Functional mobility training;Stair training;Gait training;Therapeutic exercise;Balance training;Neuromuscular re-education;Patient/family education    PT Next Visit Plan  General spine and leg strength work along with body mechanics and general conditioning, stretching and manual to Rt hamstrings    PT Home Exercise Plan  See patient instructions    Consulted and Agree with Plan of Care  Patient       Patient will benefit from skilled therapeutic intervention in order to improve the following deficits and impairments:  Abnormal gait, Decreased activity tolerance, Decreased endurance, Decreased range of motion, Decreased mobility, Decreased strength, Impaired flexibility, Pain, Obesity  Visit Diagnosis: Chronic right-sided low back pain without sciatica  Chronic pain of right knee  Difficulty in walking, not elsewhere classified     Problem List Patient Active Problem List   Diagnosis Date Noted  . Low back pain 03/21/2020  . Statin myopathy 11/16/2019  . Atrophic vaginitis 06/18/2017  . Sprain of calcaneofibular ligament of left ankle 01/16/2017  . Obesity (BMI 30.0-34.9) 01/05/2015  . CAD S/P percutaneous coronary angioplasty 04/21/2013  . Hypercholesterolemia 04/21/2013  . Essential hypertension 04/21/2013    April Manson, PT,DPT 04/12/2020, 6:57 PM  [facility] Physical Therapy [address] [place], [place], [address] Phone: [phone]   Fax:  [phone]  Name: MARTRICE APT MRN: [id number] Date of  Birth: [birth year]

## 2020-04-17 ENCOUNTER — Other Ambulatory Visit: Payer: Self-pay

## 2020-04-17 ENCOUNTER — Ambulatory Visit (INDEPENDENT_AMBULATORY_CARE_PROVIDER_SITE_OTHER): Payer: Medicare Other | Admitting: Physical Therapy

## 2020-04-17 DIAGNOSIS — M545 Low back pain, unspecified: Secondary | ICD-10-CM

## 2020-04-17 DIAGNOSIS — M25561 Pain in right knee: Secondary | ICD-10-CM | POA: Diagnosis not present

## 2020-04-17 DIAGNOSIS — R262 Difficulty in walking, not elsewhere classified: Secondary | ICD-10-CM

## 2020-04-17 DIAGNOSIS — G8929 Other chronic pain: Secondary | ICD-10-CM | POA: Diagnosis not present

## 2020-04-17 NOTE — Therapy (Signed)
[facility] Physical Therapy [address] [place], [place], [address] Phone: [phone]   Fax:  [phone]  Physical Therapy Treatment  Patient Details  Name: Madison Mosley MRN: [id number] Date of Birth: 10/18/1951 Referring Provider (PT): Eldred Manges   Encounter Date: 04/17/2020  PT End of Session - 04/17/20 1548    Visit Number  5    Number of Visits  12    Date for PT Re-Evaluation  05/21/20    PT Start Time  1515    PT Stop Time  1553    PT Time Calculation (min)  38 min    Activity Tolerance  Patient tolerated treatment well    Behavior During Therapy  [facility] for tasks assessed/performed       Past Medical History:  Diagnosis Date  . CAD (coronary artery disease)    stents  . Hyperlipidemia     Past Surgical History:  Procedure Laterality Date  . CARDIAC CATHETERIZATION  2001   Percutaneous revascularization precedure with angioplasty to the proximal LAD and first diagonal   . CESAREAN SECTION    . CHOLECYSTECTOMY    . PARTIAL HYSTERECTOMY    . TONSILLECTOMY  1960    There were no vitals filed for this visit.  Subjective Assessment - 04/17/20 1539    Subjective  no back pain to report today but still some pain in her Rt hamstring area    Limitations  Standing;Walking    How long can you sit comfortably?  Not limited (NL)    How long can you stand comfortably?  30 minutes    How long can you walk comfortably?  30 minutes    Patient Stated Goals  See above    Pain Onset  More than a month ago      [facility] Adult PT Treatment/Exercise - 04/17/20 0001      Knee/Hip Exercises: Stretches   Active Hamstring Stretch  Right;3 reps;30 seconds    Other Knee/Hip Stretches  slantboard stretch 30 sec X3       Knee/Hip Exercises: Aerobic   Nustep  L5 X 6 min UE/LE      Knee/Hip Exercises: Machines for Strengthening   Cybex Knee Extension  5 lbs 2X15     Cybex Knee Flexion  35 lbs 2X15    Total Gym Leg Press  75 lbs 3X10 bilat push    Other  Machine  standing cable row machine 10 lbs ea side 2X15 reps      Knee/Hip Exercises: Standing   Other Standing Knee Exercises  stairs up/down half flight in hallway, can come up reciprocally but has to come down step to pattern due to Rt knee flexion ROM deficits.      Knee/Hip Exercises: Seated   Sit to Sand  2 sets;10 reps;without UE support   slightly raised mat table     Knee/Hip Exercises: Prone   Hamstring Curl  2 sets;10 reps    Hamstring Curl Limitations  2.5 lbs      Manual Therapy   Manual therapy comments  STM/IASTM to Rt hamstrings and calves in prone lying       PT Short Term Goals - 04/12/20 1856      PT SHORT TERM GOAL #1   Title  [name] will be independent with her starter HEP within 2 weeks.    Time  2    Period  Weeks    Status  Achieved    Target Date  04/09/20  PT Long Term Goals - 04/12/20 1856      PT LONG TERM GOAL #1   Title  [name] will report low back and R knee pain consistently < 3/10 on the Numeric Pain rating Scale.    Baseline  5    Time  8    Period  Weeks    Status  On-going      PT LONG TERM GOAL #2   Title  [name] will be able to stand and walk with minimal soreness/stiffness and participate more fully in her ADLs.    Baseline  still soreness and catching in Rt posterior leg    Time  8    Period  Weeks    Status  On-going      PT LONG TERM GOAL #3   Title  [name] will improve her spine and leg strength as assessed by improved MMT scores and functional self-report.    Time  8    Period  Weeks    Status  On-going      PT LONG TERM GOAL #4   Title  [name] will be independent with her long-term DC HEP.    Time  8    Period  Weeks    Status  On-going            Plan - 04/17/20 1600    Clinical Impression Statement  She has improved with back pain, but stilll having Rt hamstring pain and tighntess as well as Rt knee flexion ROM deficit. PT will continue to progress this as able, she will need MD progress note next visit  so PT will update measurements and goals then.    Personal Factors and Comorbidities  Comorbidity 1;Comorbidity 2    Comorbidities  Cardiac history including 3 stents and cardiac catheterization    Examination-Activity Limitations  Bathing;Dressing;Transfers;Bed Mobility;Bend;Lift;Squat;Stairs    Examination-Participation Restrictions  Church;Interpersonal Relationship;Cleaning;Laundry;Shop;Community Activity    Stability/Clinical Decision Making  Stable/Uncomplicated    Rehab Potential  Good    PT Frequency  3x / week    PT Duration  4 weeks    PT Treatment/Interventions  ADLs/Self Care Home Management;Moist Heat;Electrical Stimulation;Cryotherapy;Therapeutic activities;Functional mobility training;Stair training;Gait training;Therapeutic exercise;Balance training;Neuromuscular re-education;Patient/family education    PT Next Visit Plan  needs MD progress note as well as work on Rt knee flexion ROM and strength so she can perform stairs reciprocally. General spine and leg strength work along with body mechanics and general conditioning, stretching and manual to Rt hamstrings    PT Home Exercise Plan  See patient instructions    Consulted and Agree with Plan of Care  Patient       Patient will benefit from skilled therapeutic intervention in order to improve the following deficits and impairments:  Abnormal gait, Decreased activity tolerance, Decreased endurance, Decreased range of motion, Decreased mobility, Decreased strength, Impaired flexibility, Pain, Obesity  Visit Diagnosis: Chronic right-sided low back pain without sciatica  Chronic pain of right knee  Difficulty in walking, not elsewhere classified     Problem List Patient Active Problem List   Diagnosis Date Noted  . Low back pain 03/21/2020  . Statin myopathy 11/16/2019  . Atrophic vaginitis 06/18/2017  . Sprain of calcaneofibular ligament of left ankle 01/16/2017  . Obesity (BMI 30.0-34.9) 01/05/2015  . CAD S/P  percutaneous coronary angioplasty 04/21/2013  . Hypercholesterolemia 04/21/2013  . Essential hypertension 04/21/2013    Silvestre Mesi 04/17/2020, 4:07 PM  [facility] Physical Therapy [place], [place],  [address] Phone: [phone]   Fax:  [phone]  Name: NALANIE WINIECKI MRN: [id number] Date of Birth: 1951-04-22

## 2020-04-19 ENCOUNTER — Other Ambulatory Visit: Payer: Self-pay

## 2020-04-19 ENCOUNTER — Encounter: Payer: Self-pay | Admitting: Rehabilitative and Restorative Service Providers"

## 2020-04-19 ENCOUNTER — Ambulatory Visit (INDEPENDENT_AMBULATORY_CARE_PROVIDER_SITE_OTHER): Payer: Medicare Other | Admitting: Rehabilitative and Restorative Service Providers"

## 2020-04-19 DIAGNOSIS — M545 Low back pain: Secondary | ICD-10-CM | POA: Diagnosis not present

## 2020-04-19 DIAGNOSIS — R262 Difficulty in walking, not elsewhere classified: Secondary | ICD-10-CM | POA: Diagnosis not present

## 2020-04-19 DIAGNOSIS — G8929 Other chronic pain: Secondary | ICD-10-CM

## 2020-04-19 DIAGNOSIS — M25561 Pain in right knee: Secondary | ICD-10-CM | POA: Diagnosis not present

## 2020-04-19 NOTE — Therapy (Signed)
[facility] [address] [place], [place], [address] Phone: [phone]   Fax:  [phone]  Physical Therapy Treatment/Reassessment  Patient Details  Name: Madison Mosley MRN: [id number] Date of Birth: Apr 21, 1951 Referring Provider (PT): Marybelle Killings  Encounter Date: 04/19/2020  PT End of Session - 04/19/20 1230    Visit Number  6    Number of Visits  18    Date for PT Re-Evaluation  05/21/20    PT Start Time  2706    PT Stop Time  1230    PT Time Calculation (min)  45 min    Activity Tolerance  Patient tolerated treatment well    Behavior During Therapy  [facility] for tasks assessed/performed       Past Medical History:  Diagnosis Date  . CAD (coronary artery disease)    stents  . Hyperlipidemia     Past Surgical History:  Procedure Laterality Date  . CARDIAC CATHETERIZATION  2001   Percutaneous revascularization precedure with angioplasty to the proximal LAD and first diagonal   . CESAREAN SECTION    . CHOLECYSTECTOMY    . PARTIAL HYSTERECTOMY    . TONSILLECTOMY  1960    There were no vitals filed for this visit.  Subjective Assessment - 04/19/20 1016    Subjective  --    Limitations  Standing;Walking    How long can you sit comfortably?  Not limited (NL)    How long can you stand comfortably?  30 minutes    How long can you walk comfortably?  30 minutes    Patient Stated Goals  See above    Currently in Pain?  Yes    Pain Score  4     Pain Location  Knee    Pain Orientation  Right    Pain Descriptors / Indicators  Tightness;Shooting;Sharp    Pain Type  Chronic pain    Pain Onset  More than a month ago    Pain Frequency  Constant    Aggravating Factors   Sitting in a car and getting out of a car after prolonged sitting.    Pain Relieving Factors  Tylenol and Advil.    Effect of Pain on Daily Activities  Limits efficiency with stairs.  WB endurance is about 30 minutes.  Slows her down with activities around the house.                         [place] Adult PT Treatment/Exercise - 04/19/20 0001      Knee/Hip Exercises: Aerobic   Nustep  L5 X 8 min UE/LE      Knee/Hip Exercises: Machines for Strengthening   Cybex Knee Extension  25# 2 sets of 10    Total Gym Leg Press  100# 3 sets of 10 Double leg & 2 sets of 10 50# single leg      Knee/Hip Exercises: Seated   Sit to Sand  2 sets;10 reps   Slow eccentrics, use hands as needed            PT Education - 04/19/20 1329    Education Details  Reviewed [name]'s HEP.  Reminded her of the importance of consistent HEP compliance to meet long-term goals including improved knee stability.    Person(s) Educated  Patient    Methods  Explanation;Demonstration;Verbal cues    Comprehension  Verbalized understanding;Returned demonstration;Verbal cues required;Need further instruction       PT Short Term Goals -  04/12/20 1856      PT SHORT TERM GOAL #1   Title  [name] will be independent with her starter HEP within 2 weeks.    Time  2    Period  Weeks    Status  Achieved    Target Date  04/09/20        PT Long Term Goals - 04/19/20 1230      PT LONG TERM GOAL #1   Title  [name] will report low back and R knee pain consistently < 3/10 on the Numeric Pain rating Scale.    Baseline  5/10    Time  4    Period  Weeks    Status  On-going    Target Date  05/18/20      PT LONG TERM GOAL #2   Title  [name] will be able to stand and walk with minimal soreness/stiffness and participate more fully in her ADLs.    Baseline  R posterior knee is sore, although she can now stand from sitting without UE support.    Time  4    Period  Weeks    Status  On-going    Target Date  05/18/20      PT LONG TERM GOAL #3   Title  [name] will improve her spine and leg strength as assessed by improved MMT scores and functional self-report.    Baseline  [name] can now stand from sitting without UE support.  Her knee still is unstable and will benefit from continued  work.    Time  4    Period  Weeks    Status  Partially Met    Target Date  05/18/20      PT LONG TERM GOAL #4   Title  [name] will be independent with her long-term DC HEP.    Time  4    Period  Weeks    Status  On-going            Plan - 04/19/20 1230    Clinical Impression Statement  [name] is making progress with her knee strength and stability.  She is now able to stand without UE support (unable at evaluation) and she is able to get on and off a beach towel (unable at eval).  Her knee still feels unstable and her quadriceps strength is not where it can be with continued work.  With continued supervised strength work and consistent HEP compliance, [name] should meet all long-term goals.    Personal Factors and Comorbidities  Comorbidity 1;Comorbidity 2    Comorbidities  Cardiac history including 3 stents and cardiac catheterization    Examination-Activity Limitations  Bathing;Dressing;Transfers;Bed Mobility;Bend;Lift;Squat;Stairs    Examination-Participation Restrictions  Church;Interpersonal Relationship;Cleaning;Laundry;Shop;Community Activity    Stability/Clinical Decision Making  Stable/Uncomplicated    Clinical Decision Making  Low    Rehab Potential  Good    PT Frequency  3x / week    PT Duration  4 weeks    PT Treatment/Interventions  ADLs/Self Care Home Management;Moist Heat;Electrical Stimulation;Cryotherapy;Therapeutic activities;Functional mobility training;Stair training;Gait training;Therapeutic exercise;Balance training;Neuromuscular re-education;Patient/family education    PT Next Visit Plan  Quadriceps strength progressions to improve stability, stairs and endurance with standing and walking.    PT Home Exercise Plan  Sit to stand exercise to complement her gym program.  Will be progressed next week.    Consulted and Agree with Plan of Care  Patient       Patient will benefit from skilled therapeutic intervention in order to  improve the following deficits and  impairments:  Abnormal gait, Decreased activity tolerance, Decreased endurance, Decreased range of motion, Decreased mobility, Decreased strength, Impaired flexibility, Pain, Obesity  Visit Diagnosis: Chronic right-sided low back pain without sciatica  Chronic pain of right knee  Difficulty in walking, not elsewhere classified     Problem List Patient Active Problem List   Diagnosis Date Noted  . Low back pain 03/21/2020  . Statin myopathy 11/16/2019  . Atrophic vaginitis 06/18/2017  . Sprain of calcaneofibular ligament of left ankle 01/16/2017  . Obesity (BMI 30.0-34.9) 01/05/2015  . CAD S/P percutaneous coronary angioplasty 04/21/2013  . Hypercholesterolemia 04/21/2013  . Essential hypertension 04/21/2013    Farley Ly PT, MPT 04/19/2020, 4:19 PM  [facility] [address] [place], [place], [address] Phone: [phone]   Fax:  [phone]  Name: Madison Mosley MRN: [id number] Date of Birth: 24-Sep-1951

## 2020-04-25 ENCOUNTER — Encounter: Payer: Self-pay | Admitting: Orthopaedic Surgery

## 2020-04-25 ENCOUNTER — Ambulatory Visit (INDEPENDENT_AMBULATORY_CARE_PROVIDER_SITE_OTHER): Payer: Medicare Other | Admitting: Orthopaedic Surgery

## 2020-04-25 DIAGNOSIS — M1711 Unilateral primary osteoarthritis, right knee: Secondary | ICD-10-CM | POA: Diagnosis not present

## 2020-04-25 NOTE — Progress Notes (Signed)
Office Visit Note   Patient: Madison Mosley           Date of Birth: [birth year]           MRN: [id number] Visit Date: 04/25/2020              Requested by: Merri Brunette, MD [address] [place],  [place] [address] PCP: Merri Brunette, MD   Assessment & Plan: Visit Diagnoses:  1. Unilateral primary osteoarthritis, right knee     Plan: She will finish out therapy she will call if she develops locking.  Recheck 3 months she will return sooner if she is having problems she will continue with working on some weight loss look at options for water aerobics which should not bother her back or knees.  If she develops any locking we will proceed with MRI scan of her right knee to rule out meniscal tear.  Follow-Up Instructions: Return in about 3 months (around 07/26/2020).   Orders:  No orders of the defined types were placed in this encounter.  No orders of the defined types were placed in this encounter.     Procedures: No procedures performed   Clinical Data: No additional findings.   Subjective: Chief Complaint  Patient presents with  . Lower Back - Pain, Follow-up    HPI patient turns she got about 50% improvement with therapy.  Still has some catching in her right knee no true locking.  Mild swelling intermittently.  She denies any associated back pain currently.  This weekend she had increased symptoms use some Tylenol and 1 ibuprofen and have problems sleeping.  She has had previous cortisone injection in her knee which helped some.  She was on Mobic at one point but not now.  She takes baby aspirin for her heart.  Review of Systems reviewed updated unchanged, all systems.   Objective: Vital Signs: Ht 5\' 8"  (1.727 m)   Wt 225 lb (102.1 kg)   BMI 34.21 kg/m   Physical Exam Constitutional:      Appearance: She is well-developed.  HENT:     Head: Normocephalic.     Right Ear: External ear normal.     Left Ear: External ear normal.  Eyes:   Pupils: Pupils are equal, round, and reactive to light.  Neck:     Thyroid: No thyromegaly.     Trachea: No tracheal deviation.  Cardiovascular:     Rate and Rhythm: Normal rate.  Pulmonary:     Effort: Pulmonary effort is normal.  Abdominal:     Palpations: Abdomen is soft.  Skin:    General: Skin is warm and dry.  Neurological:     Mental Status: She is alert and oriented to person, place, and time.  Psychiatric:        Behavior: Behavior normal.     Ortho Exam patient gets from sitting to standing she has some crepitus with knee flexion and extension.  Some pain with patellar loading quadriceps contracture collateral ligaments are stable more medial than lateral joint line tenderness.  Distal pulses palpable negative logroll the hips negative straight leg raising 90 degrees no Baker's cyst.  Specialty Comments:  No specialty comments available.  Imaging: No results found.   PMFS History: Patient Active Problem List   Diagnosis Date Noted  . Unilateral primary osteoarthritis, right knee 04/25/2020  . Low back pain 03/21/2020  . Statin myopathy 11/16/2019  . Atrophic vaginitis 06/18/2017  . Sprain of calcaneofibular ligament of  left ankle 01/16/2017  . Obesity (BMI 30.0-34.9) 01/05/2015  . CAD S/P percutaneous coronary angioplasty 04/21/2013  . Hypercholesterolemia 04/21/2013  . Essential hypertension 04/21/2013   Past Medical History:  Diagnosis Date  . CAD (coronary artery disease)    stents  . Hyperlipidemia     Family History  Problem Relation Age of Onset  . COPD Mother   . Heart failure Mother   . Diabetes Mother   . Valvular heart disease Mother        mitral valve leakage  . Heart attack Father 69       multiple heart attacks  . Heart attack Sister 80  . CAD Brother 48  . Heart attack Maternal Grandfather   . Heart attack Paternal Grandfather        multiple hearts attacks  . Stroke Paternal Grandfather     Past Surgical History:  Procedure  Laterality Date  . CARDIAC CATHETERIZATION  2001   Percutaneous revascularization precedure with angioplasty to the proximal LAD and first diagonal   . CESAREAN SECTION    . CHOLECYSTECTOMY    . PARTIAL HYSTERECTOMY    . TONSILLECTOMY  1960   Social History   Occupational History  . Not on file  Tobacco Use  . Smoking status: Never Smoker  . Smokeless tobacco: Never Used  Substance and Sexual Activity  . Alcohol use: No    Alcohol/week: 0.0 standard drinks  . Drug use: No  . Sexual activity: Not on file

## 2020-05-02 ENCOUNTER — Encounter: Payer: Self-pay | Admitting: Rehabilitative and Restorative Service Providers"

## 2020-05-02 ENCOUNTER — Other Ambulatory Visit: Payer: Self-pay

## 2020-05-02 ENCOUNTER — Ambulatory Visit (INDEPENDENT_AMBULATORY_CARE_PROVIDER_SITE_OTHER): Payer: Medicare Other | Admitting: Rehabilitative and Restorative Service Providers"

## 2020-05-02 DIAGNOSIS — M545 Low back pain, unspecified: Secondary | ICD-10-CM

## 2020-05-02 DIAGNOSIS — G8929 Other chronic pain: Secondary | ICD-10-CM | POA: Diagnosis not present

## 2020-05-02 DIAGNOSIS — M25561 Pain in right knee: Secondary | ICD-10-CM

## 2020-05-02 DIAGNOSIS — R262 Difficulty in walking, not elsewhere classified: Secondary | ICD-10-CM | POA: Diagnosis not present

## 2020-05-02 NOTE — Therapy (Signed)
[facility] [address] [place], [place], [address] Phone: [phone]   Fax:  [phone]  Physical Therapy Treatment  Patient Details  Name: Madison Mosley MRN: [id number] Date of Birth: [birth year] Referring Provider (PT): Marybelle Killings   Encounter Date: 05/02/2020   PT End of Session - 05/02/20 1100    Visit Number 7    Number of Visits 18    Date for PT Re-Evaluation 05/21/20    PT Start Time 0930    PT Stop Time 3419    PT Time Calculation (min) 45 min    Activity Tolerance Patient tolerated treatment well;No increased pain    Behavior During Therapy WFL for tasks assessed/performed           Past Medical History:  Diagnosis Date   CAD (coronary artery disease)    stents   Hyperlipidemia     Past Surgical History:  Procedure Laterality Date   CARDIAC CATHETERIZATION  2001   Percutaneous revascularization precedure with angioplasty to the proximal LAD and first diagonal    [place]    There were no vitals filed for this visit.   Subjective Assessment - 05/02/20 0930    Subjective Madison Mosley reports doing a lot of wlaking on vacation.  See "did better" but is still limited with stairs and with a "constant" irritation/pain in her R posterior knee and calf.    Limitations Standing;Walking    How long can you sit comfortably? Not limited (NL)    How long can you stand comfortably? 30 minutes    How long can you walk comfortably? 30 minutes    Patient Stated Goals See above    Currently in Pain? Yes    Pain Score 3     Pain Location Knee    Pain Orientation Right    Pain Descriptors / Indicators Tightness;Aching;Constant;Cramping    Pain Type Chronic pain    Pain Onset More than a month ago    Pain Frequency Constant    Aggravating Factors  Prolonged sitting (car).    Effect of Pain on Daily Activities Stairs.  Endurance is better but  still limited.                             [place] Adult PT Treatment/Exercise - 05/02/20 0001      Neuro Re-ed    Neuro Re-ed Details  Next visit      Knee/Hip Exercises: Aerobic   Recumbent Bike Next visit    Nustep L5 X 8 min UE/LE      Knee/Hip Exercises: Machines for Strengthening   Cybex Knee Extension 25# 2 sets of 10    Total Gym Leg Press 125# 3 sets of 10 Double leg and 2 sets of 10 50#      Knee/Hip Exercises: Standing   Heel Raises Both;5 sets;2 sets   3 seconds     Knee/Hip Exercises: Seated   Sit to Sand 2 sets;10 reps   Slow eccentrics, use hands as needed     Knee/Hip Exercises: Supine   Quad Sets Strengthening;10 reps   5 seconds                 PT Education - 05/02/20 1118    Education Details Added heel raises to address some calf soreness.  Reinforced the importance of  consistent HEP compliance (inconsistent on vacation) to meet long-term goals.    Person(s) Educated Patient    Methods Explanation;Demonstration;Verbal cues;Handout    Comprehension Verbal cues required;Need further instruction;Returned demonstration;Verbalized understanding            PT Short Term Goals - 04/12/20 1856      PT SHORT TERM GOAL #1   Title Madison Mosley will be independent with her starter HEP within 2 weeks.    Time 2    Period Weeks    Status Achieved    Target Date 04/09/20             PT Long Term Goals - 04/19/20 1230      PT LONG TERM GOAL #1   Title Madison Mosley will report low back and R knee pain consistently < 3/10 on the Numeric Pain rating Scale.    Baseline 5/10    Time 4    Period Weeks    Status On-going    Target Date 05/18/20      PT LONG TERM GOAL #2   Title Madison Mosley will be able to stand and walk with minimal soreness/stiffness and participate more fully in her ADLs.    Baseline R posterior knee is sore, although she can now stand from sitting without UE support.    Time 4    Period Weeks    Status On-going    Target Date  05/18/20      PT LONG TERM GOAL #3   Title Madison Mosley will improve her spine and leg strength as assessed by improved MMT scores and functional self-report.    Baseline Madison Mosley can now stand from sitting without UE support.  Her knee still is unstable and will benefit from continued work.    Time 4    Period Weeks    Status Partially Met    Target Date 05/18/20      PT LONG TERM GOAL #4   Title Madison Mosley will be independent with her long-term DC HEP.    Time 4    Period Weeks    Status On-going                 Plan - 05/02/20 1015    Clinical Impression Statement Madison Mosley reports "doing better" with [place].  Stairs and prolonged sitting/driving still limit function.  Knee instability due to weakness is still functionally limiting and is being addressed with her current program.    Personal Factors and Comorbidities Comorbidity 1;Comorbidity 2    Comorbidities Cardiac history including 3 stents and cardiac catheterization    Examination-Activity Limitations Bathing;Dressing;Transfers;Bed Mobility;Bend;Lift;Squat;Stairs    Examination-Participation Restrictions Church;Interpersonal Relationship;Cleaning;Laundry;Shop;Community Activity    Stability/Clinical Decision Making Stable/Uncomplicated    Rehab Potential Good    PT Frequency 3x / week    PT Duration 4 weeks    PT Treatment/Interventions ADLs/Self Care Home Management;Moist Heat;Electrical Stimulation;Cryotherapy;Therapeutic activities;Functional mobility training;Stair training;Gait training;Therapeutic exercise;Balance training;Neuromuscular re-education;Patient/family education    PT Next Visit Plan Quadriceps strength progressions to improve stability, stairs and endurance with standing and walking.    PT Home Exercise Plan Added heel raises.  Will add balance to improve stability.    Consulted and Agree with Plan of Care Patient           Patient will benefit from skilled therapeutic  intervention in order to improve the following deficits and impairments:  Abnormal gait, Decreased activity tolerance, Decreased endurance, Decreased range of motion, Decreased mobility, Decreased  strength, Impaired flexibility, Pain, Obesity  Visit Diagnosis: Chronic right-sided low back pain without sciatica  Chronic pain of right knee  Difficulty in walking, not elsewhere classified     Problem List Patient Active Problem List   Diagnosis Date Noted   Unilateral primary osteoarthritis, right knee 04/25/2020   Low back pain 03/21/2020   Statin myopathy 11/16/2019   Atrophic vaginitis 06/18/2017   Sprain of calcaneofibular ligament of left ankle 01/16/2017   Obesity (BMI 30.0-34.9) 01/05/2015   CAD S/P percutaneous coronary angioplasty 04/21/2013   Hypercholesterolemia 04/21/2013   Essential hypertension 04/21/2013    Farley Ly PT, MPT 05/02/2020, 11:23 AM  [facility] [address] [place], [place], [address] Phone: [phone]   Fax:  [phone]  Name: Madison Mosley MRN: [id number] Date of Birth: 11/03/1951

## 2020-05-02 NOTE — Patient Instructions (Signed)
Access Code: [email or web address] URL: [email or web address] Date: 05/02/2020 Prepared by: Pauletta Browns  Exercises Standing Heel Raise - 6 x daily - 7 x weekly - 1 sets - 5 reps - 3 seconds hold

## 2020-05-04 ENCOUNTER — Other Ambulatory Visit: Payer: Self-pay

## 2020-05-04 ENCOUNTER — Encounter: Payer: Self-pay | Admitting: Rehabilitative and Restorative Service Providers"

## 2020-05-04 ENCOUNTER — Ambulatory Visit (INDEPENDENT_AMBULATORY_CARE_PROVIDER_SITE_OTHER): Payer: Medicare Other | Admitting: Rehabilitative and Restorative Service Providers"

## 2020-05-04 DIAGNOSIS — R262 Difficulty in walking, not elsewhere classified: Secondary | ICD-10-CM

## 2020-05-04 DIAGNOSIS — M545 Low back pain, unspecified: Secondary | ICD-10-CM

## 2020-05-04 DIAGNOSIS — M25561 Pain in right knee: Secondary | ICD-10-CM

## 2020-05-04 DIAGNOSIS — G8929 Other chronic pain: Secondary | ICD-10-CM | POA: Diagnosis not present

## 2020-05-04 NOTE — Therapy (Signed)
[facility] [address] [place], [place], [address] Phone: [phone]   Fax:  [phone]  Physical Therapy Treatment  Patient Details  Name: Madison Mosley MRN: [id number] Date of Birth: 11-30-1950 Referring Provider (PT): Marybelle Killings   Encounter Date: 05/04/2020   PT End of Session - 05/04/20 0930    Visit Number 8    Number of Visits 18    Date for PT Re-Evaluation 05/21/20    PT Start Time 0930    PT Stop Time 9678    PT Time Calculation (min) 45 min    Activity Tolerance Patient tolerated treatment well;No increased pain    Behavior During Therapy WFL for tasks assessed/performed           Past Medical History:  Diagnosis Date  . CAD (coronary artery disease)    stents  . Hyperlipidemia     Past Surgical History:  Procedure Laterality Date  . CARDIAC CATHETERIZATION  2001   Percutaneous revascularization precedure with angioplasty to the proximal LAD and first diagonal   . CESAREAN SECTION    . CHOLECYSTECTOMY    . PARTIAL HYSTERECTOMY    . TONSILLECTOMY  1960    There were no vitals filed for this visit.   Subjective Assessment - 05/04/20 0928    Subjective Madison Mosley reports minimal pain with 440 miles in the car yesterday.  HEP compliance was zero yesterday.    Limitations Standing;Walking    How long can you sit comfortably? Not limited (NL)    How long can you stand comfortably? 30 minutes    How long can you walk comfortably? 30 minutes    Patient Stated Goals See above    Pain Onset More than a month ago                             [facility] Adult PT Treatment/Exercise - 05/04/20 0001      Neuro Re-ed    Neuro Re-ed Details  Heel to toe balance 3X each eyes open & closed 20 seconds      Knee/Hip Exercises: Aerobic   Recumbent Bike Next visit      Knee/Hip Exercises: Machines for Strengthening   Cybex Knee Extension 15# 2 sets of 10    Total Gym Leg Press 100# 3 sets of 10      Knee/Hip  Exercises: Standing   Heel Raises Both;5 sets;2 sets   3 seconds     Knee/Hip Exercises: Seated   Long Arc Quad Strengthening;3 sets;5 sets   Seated with 3 pillows behind her back   Sit to Sand 2 sets;10 reps   Slow eccentrics, use hands as needed                 PT Education - 05/04/20 1254    Education Details Added seated straight leg raises and heel to toe balance to HEP.    Person(s) Educated Patient    Methods Explanation;Demonstration;Verbal cues;Handout    Comprehension Verbal cues required;Returned demonstration;Need further instruction;Verbalized understanding            PT Short Term Goals - 04/12/20 1856      PT SHORT TERM GOAL #1   Title Madison Mosley will be independent with her starter HEP within 2 weeks.    Time 2    Period Weeks    Status Achieved    Target Date 04/09/20  PT Long Term Goals - 04/19/20 1230      PT LONG TERM GOAL #1   Title Madison Mosley will report low back and R knee pain consistently < 3/10 on the Numeric Pain rating Scale.    Baseline 5/10    Time 4    Period Weeks    Status On-going    Target Date 05/18/20      PT LONG TERM GOAL #2   Title Madison Mosley will be able to stand and walk with minimal soreness/stiffness and participate more fully in her ADLs.    Baseline R posterior knee is sore, although she can now stand from sitting without UE support.    Time 4    Period Weeks    Status On-going    Target Date 05/18/20      PT LONG TERM GOAL #3   Title Madison Mosley will improve her spine and leg strength as assessed by improved MMT scores and functional self-report.    Baseline Madison Mosley can now stand from sitting without UE support.  Her knee still is unstable and will benefit from continued work.    Time 4    Period Weeks    Status Partially Met    Target Date 05/18/20      PT LONG TERM GOAL #4   Title Madison Mosley will be independent with her long-term DC HEP.    Time 4    Period Weeks    Status On-going                 Plan -  05/04/20 1015    Clinical Impression Statement Madison Mosley will benefit from more consistent HEP compliance.  Her program was updated with more advanced quadriceps strengthening along with co-contraction/stabilization activities with tandem balance.  She has a 4 exercise HEP which is addressing her remaining weakness, instability and pain.  Activities will be progressed as appropriate although consistent HEP compliance needs to improve.    Personal Factors and Comorbidities Comorbidity 1;Comorbidity 2    Comorbidities Cardiac history including 3 stents and cardiac catheterization    Examination-Activity Limitations Bathing;Dressing;Transfers;Bed Mobility;Bend;Lift;Squat;Stairs    Examination-Participation Restrictions Church;Interpersonal Relationship;Cleaning;Laundry;Shop;Community Activity    Stability/Clinical Decision Making Stable/Uncomplicated    Rehab Potential Good    PT Frequency 3x / week    PT Duration 4 weeks    PT Treatment/Interventions ADLs/Self Care Home Management;Moist Heat;Electrical Stimulation;Cryotherapy;Therapeutic activities;Functional mobility training;Stair training;Gait training;Therapeutic exercise;Balance training;Neuromuscular re-education;Patient/family education    PT Next Visit Plan Quadriceps strength progressions to improve stability, stairs and endurance with standing and walking.    PT Home Exercise Plan Added heel raises.  Will add balance to improve stability.    Consulted and Agree with Plan of Care Patient           Patient will benefit from skilled therapeutic intervention in order to improve the following deficits and impairments:  Abnormal gait, Decreased activity tolerance, Decreased endurance, Decreased range of motion, Decreased mobility, Decreased strength, Impaired flexibility, Pain, Obesity  Visit Diagnosis: Chronic pain of right knee  Difficulty in walking, not elsewhere classified  Chronic right-sided low back pain without  sciatica     Problem List Patient Active Problem List   Diagnosis Date Noted  . Unilateral primary osteoarthritis, right knee 04/25/2020  . Low back pain 03/21/2020  . Statin myopathy 11/16/2019  . Atrophic vaginitis 06/18/2017  . Sprain of calcaneofibular ligament of left ankle 01/16/2017  . Obesity (BMI 30.0-34.9) 01/05/2015  . CAD S/P percutaneous coronary angioplasty 04/21/2013  . Hypercholesterolemia  04/21/2013  . Essential hypertension 04/21/2013    Farley Ly PT, MPT 05/04/2020, 12:59 PM  [facility] [address] [place], [place], [address] Phone: [phone]   Fax:  [phone]  Name: Madison Mosley MRN: [id number] Date of Birth: 1951/01/17

## 2020-05-04 NOTE — Patient Instructions (Signed)
Access Code: [email or web address] URL: [email or web address] Date: 05/04/2020 Prepared by: Pauletta Browns  Exercises Small Range Straight Leg Raise - 1 x daily - 7 x weekly - 5 sets - 5 reps - 3 secondsinutes hold Standing Tandem Balance with Counter Support - 2 x daily - 7 x weekly - 1 sets - 4 reps - 20 seconds hold

## 2020-05-08 ENCOUNTER — Other Ambulatory Visit: Payer: Self-pay

## 2020-05-08 ENCOUNTER — Ambulatory Visit (INDEPENDENT_AMBULATORY_CARE_PROVIDER_SITE_OTHER): Payer: Medicare Other | Admitting: Rehabilitative and Restorative Service Providers"

## 2020-05-08 ENCOUNTER — Encounter: Payer: Self-pay | Admitting: Rehabilitative and Restorative Service Providers"

## 2020-05-08 DIAGNOSIS — G8929 Other chronic pain: Secondary | ICD-10-CM

## 2020-05-08 DIAGNOSIS — R262 Difficulty in walking, not elsewhere classified: Secondary | ICD-10-CM | POA: Diagnosis not present

## 2020-05-08 DIAGNOSIS — M545 Low back pain: Secondary | ICD-10-CM | POA: Diagnosis not present

## 2020-05-08 DIAGNOSIS — M25561 Pain in right knee: Secondary | ICD-10-CM | POA: Diagnosis not present

## 2020-05-08 NOTE — Therapy (Signed)
Madison Mosley [facility] [address] [place], [place], [address] Phone: [phone]   Fax:  [phone]  Physical Therapy Treatment  Patient Details  Name: Madison Mosley MRN: [id number] Date of Birth: Jul 10, 1951 Referring Provider (PT): Marybelle Killings   Encounter Date: 05/08/2020   PT End of Session - 05/08/20 1015    Visit Number 9    Number of Visits 18    Date for PT Re-Evaluation 05/21/20    PT Start Time 0930    PT Stop Time 1572    PT Time Calculation (min) 45 min    Activity Tolerance Patient tolerated treatment well;No increased pain    Behavior During Therapy WFL for tasks assessed/performed           Past Medical History:  Diagnosis Date  . CAD (coronary artery disease)    stents  . Hyperlipidemia     Past Surgical History:  Procedure Laterality Date  . CARDIAC CATHETERIZATION  2001   Percutaneous revascularization precedure with angioplasty to the proximal LAD and first diagonal   . CESAREAN SECTION    . CHOLECYSTECTOMY    . PARTIAL HYSTERECTOMY    . TONSILLECTOMY  1960    There were no vitals filed for this visit.   Subjective Assessment - 05/08/20 0930    Subjective HEP compliance was "decent" over the weekend.  Stairs were most difficult.    Limitations Standing;Walking    How long can you sit comfortably? Not limited (NL)    How long can you stand comfortably? 30 minutes    How long can you walk comfortably? 30 minutes    Patient Stated Goals See above    Pain Score 2     Pain Location Knee    Pain Orientation Right    Pain Descriptors / Indicators Aching;Constant    Pain Type Chronic pain    Pain Onset More than a month ago    Pain Frequency Constant    Aggravating Factors  Prolonged WB, prolonged sitting and stairs.    Pain Relieving Factors NSAIDS    Effect of Pain on Daily Activities Stairs and endurance are limited.              [facility] PT Assessment - 05/08/20 0001      AROM   Right Knee Extension 0     Right Knee Flexion 124    Left Knee Extension 0    Left Knee Flexion 128      Strength   Right Knee Extension 4-/5    Left Knee Extension 4+/5                         [facility] Adult PT Treatment/Exercise - 05/08/20 0001      Neuro Re-ed    Neuro Re-ed Details  Heel to toe balance 3X each eyes open & closed 20 seconds      Knee/Hip Exercises: Aerobic   Recumbent Bike Seat 6 8 minutes      Knee/Hip Exercises: Machines for Strengthening   Cybex Knee Extension 15# 2 sets of 10    Total Gym Leg Press 100# 3 sets of 10 double leg and 2 sets of 37.5# single leg      Knee/Hip Exercises: Standing   Heel Raises --   Next visit     Knee/Hip Exercises: Seated   Long Arc Quad Other (comment)   Next visit   Sit to [employer] Other (comment)   Next visit  PT Education - 05/08/20 1448    Education Details Discussed HEP (seated SLR and heel raises).    Person(s) Educated Patient    Methods Explanation    Comprehension Verbalized understanding            PT Short Term Goals - 04/12/20 1856      PT SHORT TERM GOAL #1   Title Madison Mosley will be independent with her starter HEP within 2 weeks.    Time 2    Period Weeks    Status Achieved    Target Date 04/09/20             PT Long Term Goals - 05/08/20 1015      PT LONG TERM GOAL #1   Title Madison Mosley will report low back and R knee pain consistently < 3/10 on the Numeric Pain rating Scale.    Baseline 2-5/10 (was 5/10)    Time 4    Period Weeks    Status On-going    Target Date 06/05/20      PT LONG TERM GOAL #2   Title Madison Mosley will be able to stand and walk with minimal soreness/stiffness and participate more fully in her ADLs.    Baseline R posterior knee is sore, although she can now stand from sitting without UE support.    Time 4    Period Weeks    Status On-going    Target Date 06/05/20      PT LONG TERM GOAL #3   Title Madison Mosley will improve her spine and leg strength as assessed by improved  MMT scores and functional self-report.    Baseline Madison Mosley can now stand from sitting without UE support.  Her knee still is more stable.  Endurance and comfort with stairs and standing after prolonged sitting will benefit from continued work.    Time 4    Period Weeks    Status Partially Met    Target Date 06/05/20      PT LONG TERM GOAL #4   Title Madison Mosley will be independent with her long-term DC HEP.    Time 4    Period Weeks    Status On-going    Target Date 06/05/20                 Plan - 05/08/20 1015    Clinical Impression Statement Madison Mosley reports "some" compliance with her HEP over the weekend.  Improved consistency with her HEP will help her meet long-term goals established at evaluation.  General knee/LE strengthening is needed to help with stairs, prolonged weight-bearing and standing after prolonged sitting.  Madison Mosley's prognosis for continued gains is good with the recommended course of physical therapy.    Personal Factors and Comorbidities Comorbidity 1;Comorbidity 2    Comorbidities Cardiac history including 3 stents and cardiac catheterization    Examination-Activity Limitations Bathing;Dressing;Transfers;Bed Mobility;Bend;Lift;Squat;Stairs    Examination-Participation Restrictions Church;Interpersonal Relationship;Cleaning;Laundry;Shop;Community Activity    Stability/Clinical Decision Making Stable/Uncomplicated    Rehab Potential Good    PT Frequency 3x / week    PT Duration 4 weeks    PT Treatment/Interventions ADLs/Self Care Home Management;Moist Heat;Electrical Stimulation;Cryotherapy;Therapeutic activities;Functional mobility training;Stair training;Gait training;Therapeutic exercise;Balance training;Neuromuscular re-education;Patient/family education    PT Next Visit Plan Quadriceps strength progressions to improve stability, stairs and endurance with standing and walking.    PT Home Exercise Plan Seated straight leg raises, sit to stand and heel raises for leg  strength along with balance to improve stability.    Consulted and Agree with  Plan of Care Patient           Patient will benefit from skilled therapeutic intervention in order to improve the following deficits and impairments:  Abnormal gait, Decreased activity tolerance, Decreased endurance, Decreased range of motion, Decreased mobility, Decreased strength, Impaired flexibility, Pain, Obesity  Visit Diagnosis: Chronic pain of right knee  Difficulty in walking, not elsewhere classified  Chronic right-sided low back pain without sciatica     Problem List Patient Active Problem List   Diagnosis Date Noted  . Unilateral primary osteoarthritis, right knee 04/25/2020  . Low back pain 03/21/2020  . Statin myopathy 11/16/2019  . Atrophic vaginitis 06/18/2017  . Sprain of calcaneofibular ligament of left ankle 01/16/2017  . Obesity (BMI 30.0-34.9) 01/05/2015  . CAD S/P percutaneous coronary angioplasty 04/21/2013  . Hypercholesterolemia 04/21/2013  . Essential hypertension 04/21/2013    Farley Ly PT, MPT 05/08/2020, 2:58 PM  [facility] [address] [place], [place], [address] Phone: [phone]   Fax:  [phone]  Name: TAMMARA MASSING MRN: [id number] Date of Birth: [birth year]

## 2020-05-10 ENCOUNTER — Encounter: Payer: Self-pay | Admitting: Rehabilitative and Restorative Service Providers"

## 2020-05-10 ENCOUNTER — Other Ambulatory Visit: Payer: Self-pay

## 2020-05-10 ENCOUNTER — Ambulatory Visit (INDEPENDENT_AMBULATORY_CARE_PROVIDER_SITE_OTHER): Payer: Medicare Other | Admitting: Rehabilitative and Restorative Service Providers"

## 2020-05-10 DIAGNOSIS — M545 Low back pain: Secondary | ICD-10-CM

## 2020-05-10 DIAGNOSIS — R262 Difficulty in walking, not elsewhere classified: Secondary | ICD-10-CM

## 2020-05-10 DIAGNOSIS — G8929 Other chronic pain: Secondary | ICD-10-CM

## 2020-05-10 DIAGNOSIS — M25561 Pain in right knee: Secondary | ICD-10-CM | POA: Diagnosis not present

## 2020-05-10 NOTE — Therapy (Signed)
[facility] [address] [place], [place], [address] Phone: [phone]   Fax:  [phone]  Physical Therapy Treatment  Patient Details  Name: Madison Mosley MRN: [id number] Date of Birth: [birth year] Referring Provider (PT): Marybelle Killings   Encounter Date: 05/10/2020   PT End of Session - 05/10/20 1732    Visit Number 10    Number of Visits 18    Date for PT Re-Evaluation 05/21/20    PT Start Time 0930    PT Stop Time 1245    PT Time Calculation (min) 45 min    Activity Tolerance Patient tolerated treatment well;No increased pain    Behavior During Therapy WFL for tasks assessed/performed           Past Medical History:  Diagnosis Date  . CAD (coronary artery disease)    stents  . Hyperlipidemia     Past Surgical History:  Procedure Laterality Date  . CARDIAC CATHETERIZATION  2001   Percutaneous revascularization precedure with angioplasty to the proximal LAD and first diagonal   . CESAREAN SECTION    . CHOLECYSTECTOMY    . PARTIAL HYSTERECTOMY    . TONSILLECTOMY  1960    There were no vitals filed for this visit.   Subjective Assessment - 05/10/20 1730    Subjective Madison Mosley reports less soreness today, even with a bit of an increase in activity.    Limitations Standing;Walking    How long can you sit comfortably? Not limited (NL)    How long can you stand comfortably? 30 minutes    How long can you walk comfortably? 30 minutes    Patient Stated Goals See above    Currently in Pain? Yes    Pain Score 2     Pain Location Knee    Pain Orientation Right    Pain Descriptors / Indicators Aching    Pain Type Chronic pain    Pain Onset More than a month ago    Pain Frequency Constant    Aggravating Factors  Prolonged postures and stairs    Pain Relieving Factors NSAIDS    Effect of Pain on Daily Activities Limits endurance with activities                             [facility] Adult PT Treatment/Exercise -  05/10/20 0001      Therapeutic Activites    Therapeutic Activities Other Therapeutic Activities   Steps (Aerobic step with 2 insterts) up/back & up/over   Other Therapeutic Activities 2 sets of 10 each with slow eccentrics      Neuro Re-ed    Neuro Re-ed Details  Heel to toe balance 3X each eyes open & closed 20 seconds      Knee/Hip Exercises: Aerobic   Recumbent Bike Seat 6 8 minutes      Knee/Hip Exercises: Machines for Strengthening   Cybex Knee Extension 15# 2 sets of 10    Total Gym Leg Press 106# 3 sets of 10 double leg and 2 sets of 37.5# single leg                  PT Education - 05/10/20 1732    Education Details Reviewed HEP and added functional training for stairs as they limit function.    Person(s) Educated Patient    Methods Explanation;Demonstration;Verbal cues    Comprehension Verbalized understanding;Returned demonstration;Need further instruction;Verbal cues required  PT Short Term Goals - 04/12/20 1856      PT SHORT TERM GOAL #1   Title Madison Mosley will be independent with her starter HEP within 2 weeks.    Time 2    Period Weeks    Status Achieved    Target Date 04/09/20             PT Long Term Goals - 05/08/20 1015      PT LONG TERM GOAL #1   Title Madison Mosley will report low back and R knee pain consistently < 3/10 on the Numeric Pain rating Scale.    Baseline 2-5/10 (was 5/10)    Time 4    Period Weeks    Status On-going    Target Date 06/05/20      PT LONG TERM GOAL #2   Title Madison Mosley will be able to stand and walk with minimal soreness/stiffness and participate more fully in her ADLs.    Baseline R posterior knee is sore, although she can now stand from sitting without UE support.    Time 4    Period Weeks    Status On-going    Target Date 06/05/20      PT LONG TERM GOAL #3   Title Madison Mosley will improve her spine and leg strength as assessed by improved MMT scores and functional self-report.    Baseline Madison Mosley can now stand from  sitting without UE support.  Her knee still is more stable.  Endurance and comfort with stairs and standing after prolonged sitting will benefit from continued work.    Time 4    Period Weeks    Status Partially Met    Target Date 06/05/20      PT LONG TERM GOAL #4   Title Madison Mosley will be independent with her long-term DC HEP.    Time 4    Period Weeks    Status On-going    Target Date 06/05/20                 Plan - 05/10/20 1733    Clinical Impression Statement Quadriceps strengthening remains an emphasis.  Stairs were added to address Madison Mosley's difficulty with them.  Today will be a test as she is driving to [place], getting in a boat and spending the afternoon.  The combination of prolonged sitting (car and boat), stepping into the boat (and out) and fatigue from today's clinic visit should give me information to make additional changes to Madison Mosley's program as needed.    Personal Factors and Comorbidities Comorbidity 1;Comorbidity 2    Comorbidities Cardiac history including 3 stents and cardiac catheterization    Examination-Activity Limitations Bathing;Dressing;Transfers;Bed Mobility;Bend;Lift;Squat;Stairs    Examination-Participation Restrictions Church;Interpersonal Relationship;Cleaning;Laundry;Shop;Community Activity    Stability/Clinical Decision Making Stable/Uncomplicated    Rehab Potential Good    PT Frequency 3x / week    PT Duration 4 weeks    PT Treatment/Interventions ADLs/Self Care Home Management;Moist Heat;Electrical Stimulation;Cryotherapy;Therapeutic activities;Functional mobility training;Stair training;Gait training;Therapeutic exercise;Balance training;Neuromuscular re-education;Patient/family education    PT Next Visit Plan Quadriceps strength progressions to improve stability, stairs and endurance with standing and walking.    PT Home Exercise Plan Seated straight leg raises, sit to stand and heel raises for leg strength along with balance to improve  stability.    Consulted and Agree with Plan of Care Patient           Patient will benefit from skilled therapeutic intervention in order to improve the following deficits and impairments:  Abnormal  gait, Decreased activity tolerance, Decreased endurance, Decreased range of motion, Decreased mobility, Decreased strength, Impaired flexibility, Pain, Obesity  Visit Diagnosis: Chronic pain of right knee  Difficulty in walking, not elsewhere classified  Chronic right-sided low back pain without sciatica     Problem List Patient Active Problem List   Diagnosis Date Noted  . Unilateral primary osteoarthritis, right knee 04/25/2020  . Low back pain 03/21/2020  . Statin myopathy 11/16/2019  . Atrophic vaginitis 06/18/2017  . Sprain of calcaneofibular ligament of left ankle 01/16/2017  . Obesity (BMI 30.0-34.9) 01/05/2015  . CAD S/P percutaneous coronary angioplasty 04/21/2013  . Hypercholesterolemia 04/21/2013  . Essential hypertension 04/21/2013    Farley Ly PT, MPT 05/10/2020, 5:37 PM  [facility] [address] [place], [place], [address] Phone: [phone]   Fax:  [phone]  Name: Madison Mosley MRN: [id number] Date of Birth: [birth year]

## 2020-05-14 ENCOUNTER — Encounter: Payer: Self-pay | Admitting: Rehabilitative and Restorative Service Providers"

## 2020-05-14 ENCOUNTER — Other Ambulatory Visit: Payer: Self-pay

## 2020-05-14 ENCOUNTER — Ambulatory Visit (INDEPENDENT_AMBULATORY_CARE_PROVIDER_SITE_OTHER): Payer: Medicare Other | Admitting: Rehabilitative and Restorative Service Providers"

## 2020-05-14 DIAGNOSIS — G8929 Other chronic pain: Secondary | ICD-10-CM | POA: Diagnosis not present

## 2020-05-14 DIAGNOSIS — R262 Difficulty in walking, not elsewhere classified: Secondary | ICD-10-CM | POA: Diagnosis not present

## 2020-05-14 DIAGNOSIS — M25561 Pain in right knee: Secondary | ICD-10-CM | POA: Diagnosis not present

## 2020-05-14 NOTE — Therapy (Signed)
[facility] Physical Therapy [address] [place], [place], [address] Phone: [phone]   Fax:  [phone]  Physical Therapy Treatment  Patient Details  Name: Madison Mosley MRN: [id number] Date of Birth: May 03, 1951 Referring Provider (PT): Marybelle Killings   Encounter Date: 05/14/2020   PT End of Session - 05/14/20 1215    Visit Number 11    Number of Visits 18    Date for PT Re-Evaluation 05/21/20    PT Start Time 0930    PT Stop Time 1025    PT Time Calculation (min) 45 min    Activity Tolerance Patient tolerated treatment well;No increased pain    Behavior During Therapy WFL for tasks assessed/performed           Past Medical History:  Diagnosis Date  . CAD (coronary artery disease)    stents  . Hyperlipidemia     Past Surgical History:  Procedure Laterality Date  . CARDIAC CATHETERIZATION  2001   Percutaneous revascularization precedure with angioplasty to the proximal LAD and first diagonal   . CESAREAN SECTION    . CHOLECYSTECTOMY    . PARTIAL HYSTERECTOMY    . TONSILLECTOMY  1960    There were no vitals filed for this visit.   Subjective Assessment - 05/14/20 1010    Subjective Madison Mosley was able to get in and out of the boat Friday with no increase in pain.  This is progress as compared to before PT.  Pain is still present but she is able to do more.    Limitations Standing;Walking    How long can you sit comfortably? Not limited (NL)    How long can you stand comfortably? 30 minutes    How long can you walk comfortably? 30 minutes    Patient Stated Goals See above    Currently in Pain? Yes    Pain Score 4     Pain Location Knee    Pain Orientation Right    Pain Descriptors / Indicators Aching    Pain Type Chronic pain    Pain Onset More than a month ago    Pain Frequency Constant    Aggravating Factors  Prolonged postures and stairs    Pain Relieving Factors Tylenol arthritis    Effect of Pain on Daily Activities Limits  endurance                             [facility] Adult PT Treatment/Exercise - 05/14/20 0001      Therapeutic Activites    Therapeutic Activities Other Therapeutic Activities    Other Therapeutic Activities 2 sets of 10 off high step slow eccentrics      Neuro Re-ed    Neuro Re-ed Details  Heel to toe balance 3X each eyes open & closed 20 seconds      Knee/Hip Exercises: Aerobic   Recumbent Bike Seat 6 8 minutes      Knee/Hip Exercises: Machines for Strengthening   Cybex Knee Extension 20# 2 sets of 10 double leg and 5# 2 sets of 10 up with both/down R only slow eccentrics    Total Gym Leg Press 112# 3 sets of 10 double leg and 2 sets of 43# single leg                  PT Education - 05/14/20 1212    Education Details Reviewed HEP.  Focused on slowing eccentrics with all activities and added  single leg with knee extensions (eccentric emphasis).  Madison Mosley notes feeling stronger but was reminded how weak the R knee is when attempting single lef activities (steps) with both the R and L side (R significantly weakner).  Continue R leg strengthening (quadriceps emphasis) to reduce posterior knee/leg pain and edema.    Person(s) Educated Patient    Methods Explanation;Demonstration;Verbal cues    Comprehension Verbalized understanding;Returned demonstration;Need further instruction;Verbal cues required            PT Short Term Goals - 04/12/20 1856      PT SHORT TERM GOAL #1   Title Madison Mosley will be independent with her starter HEP within 2 weeks.    Time 2    Period Weeks    Status Achieved    Target Date 04/09/20             PT Long Term Goals - 05/14/20 1214      PT LONG TERM GOAL #1   Title Madison Mosley will report low back and R knee pain consistently < 3/10 on the Numeric Pain rating Scale.    Baseline 4/10 (was 5/10)    Time 4    Period Weeks    Status On-going      PT LONG TERM GOAL #2   Title Madison Mosley will be able to stand and walk with minimal  soreness/stiffness and participate more fully in her ADLs.    Baseline R posterior knee is sore, although she can now stand from sitting without UE support and get in/out of a boat from the dock.    Time 4    Period Weeks    Status On-going      PT LONG TERM GOAL #3   Title Madison Mosley will improve her spine and leg strength as assessed by improved MMT scores and functional self-report.    Baseline Madison Mosley can now stand from sitting without UE support.  Her knee still is more stable.  Endurance and comfort with stairs and standing after prolonged sitting will benefit from continued work.    Time 4    Period Weeks    Status Partially Met      PT LONG TERM GOAL #4   Title Madison Mosley will be independent with her long-term DC HEP.    Time 4    Period Weeks    Status On-going                 Plan - 05/14/20 1215    Clinical Impression Statement Continue quadriceps strength emphasis (eccentrics) to improve R knee strength as compared to the L.  Instability is much better as is subjective strength.  Stairs, curbs and endurance will benefit from the recommended course of physical therapy.    Personal Factors and Comorbidities Comorbidity 1;Comorbidity 2    Comorbidities Cardiac history including 3 stents and cardiac catheterization    Examination-Activity Limitations Bathing;Dressing;Transfers;Bed Mobility;Bend;Lift;Squat;Stairs    Examination-Participation Restrictions Church;Interpersonal Relationship;Cleaning;Laundry;Shop;Community Activity    Stability/Clinical Decision Making Stable/Uncomplicated    Rehab Potential Good    PT Frequency 3x / week    PT Duration 4 weeks    PT Treatment/Interventions ADLs/Self Care Home Management;Moist Heat;Electrical Stimulation;Cryotherapy;Therapeutic activities;Functional mobility training;Stair training;Gait training;Therapeutic exercise;Balance training;Neuromuscular re-education;Patient/family education    PT Next Visit Plan Quadriceps strength  progressions to improve stability, stairs and endurance with standing and walking.    PT Home Exercise Plan Seated straight leg raises, sit to stand and heel raises for leg strength along with balance to improve stability.  Consulted and Agree with Plan of Care Patient           Patient will benefit from skilled therapeutic intervention in order to improve the following deficits and impairments:  Abnormal gait, Decreased activity tolerance, Decreased endurance, Decreased range of motion, Decreased mobility, Decreased strength, Impaired flexibility, Pain, Obesity  Visit Diagnosis: Chronic pain of right knee  Difficulty in walking, not elsewhere classified     Problem List Patient Active Problem List   Diagnosis Date Noted  . Unilateral primary osteoarthritis, right knee 04/25/2020  . Low back pain 03/21/2020  . Statin myopathy 11/16/2019  . Atrophic vaginitis 06/18/2017  . Sprain of calcaneofibular ligament of left ankle 01/16/2017  . Obesity (BMI 30.0-34.9) 01/05/2015  . CAD S/P percutaneous coronary angioplasty 04/21/2013  . Hypercholesterolemia 04/21/2013  . Essential hypertension 04/21/2013    Farley Ly PT, MPT 05/14/2020, 12:18 PM  [facility] Physical Therapy [address] [place], [place], [address] Phone: [phone]   Fax:  [phone]  Name: FREDRICA CAPANO MRN: [id number] Date of Birth: 1951-09-24

## 2020-05-16 ENCOUNTER — Other Ambulatory Visit: Payer: Self-pay

## 2020-05-16 ENCOUNTER — Encounter: Payer: Self-pay | Admitting: Rehabilitative and Restorative Service Providers"

## 2020-05-16 ENCOUNTER — Ambulatory Visit (INDEPENDENT_AMBULATORY_CARE_PROVIDER_SITE_OTHER): Payer: Medicare Other | Admitting: Rehabilitative and Restorative Service Providers"

## 2020-05-16 DIAGNOSIS — M1711 Unilateral primary osteoarthritis, right knee: Secondary | ICD-10-CM | POA: Diagnosis not present

## 2020-05-16 DIAGNOSIS — M545 Low back pain: Secondary | ICD-10-CM | POA: Diagnosis not present

## 2020-05-16 DIAGNOSIS — R262 Difficulty in walking, not elsewhere classified: Secondary | ICD-10-CM

## 2020-05-16 DIAGNOSIS — G8929 Other chronic pain: Secondary | ICD-10-CM | POA: Diagnosis not present

## 2020-05-16 DIAGNOSIS — M25561 Pain in right knee: Secondary | ICD-10-CM | POA: Diagnosis not present

## 2020-05-16 NOTE — Therapy (Signed)
[facility] Physical Therapy [address] [place], [place], [address] Phone: [phone]   Fax:  [phone]  Physical Therapy Treatment  Patient Details  Name: Madison Mosley MRN: [id number] Date of Birth: February 10, 1951 Referring Provider (PT): Marybelle Killings   Encounter Date: 05/16/2020   PT End of Session - 05/16/20 1639    Visit Number 12    Number of Visits 18    Date for PT Re-Evaluation 05/21/20    PT Start Time 0930    PT Stop Time 1010    PT Time Calculation (min) 40 min    Activity Tolerance Patient tolerated treatment well;No increased pain    Behavior During Therapy WFL for tasks assessed/performed           Past Medical History:  Diagnosis Date  . CAD (coronary artery disease)    stents  . Hyperlipidemia     Past Surgical History:  Procedure Laterality Date  . CARDIAC CATHETERIZATION  2001   Percutaneous revascularization precedure with angioplasty to the proximal LAD and first diagonal   . CESAREAN SECTION    . CHOLECYSTECTOMY    . PARTIAL HYSTERECTOMY    . TONSILLECTOMY  1960    There were no vitals filed for this visit.   Subjective Assessment - 05/16/20 1011    Subjective Madison Mosley was able to stand and walk for about an hour and a half without increased pain.    Limitations Standing;Walking    How long can you sit comfortably? Not limited (NL)    How long can you stand comfortably? 30 minutes    How long can you walk comfortably? 30 minutes    Patient Stated Goals See above    Currently in Pain? Yes    Pain Score 4     Pain Location Knee    Pain Orientation Right    Pain Descriptors / Indicators Aching    Pain Type Chronic pain    Pain Radiating Towards No sciatica    Pain Onset More than a month ago    Pain Frequency Constant    Aggravating Factors  Constant, although endurance is improving    Pain Relieving Factors Tylenol arthritis    Effect of Pain on Daily Activities Endurance is improving                              [facility] Adult PT Treatment/Exercise - 05/16/20 0001      Therapeutic Activites    Therapeutic Activities Other Therapeutic Activities    Other Therapeutic Activities Next visit      Neuro Re-ed    Neuro Re-ed Details  Heel to toe balance 3X each eyes open & closed 20 seconds      Knee/Hip Exercises: Aerobic   Recumbent Bike Next visit      Knee/Hip Exercises: Machines for Strengthening   Cybex Knee Extension 20# 2 sets of 10 double leg and 5# 3 sets of 10 up with both/down R only slow eccentrics    Total Gym Leg Press 112# 3 sets of 10 double leg and 2 sets of 43# single leg                    PT Short Term Goals - 04/12/20 1856      PT SHORT TERM GOAL #1   Title Madison Mosley will be independent with her starter HEP within 2 weeks.    Time 2    Period  Weeks    Status Achieved    Target Date 04/09/20             PT Long Term Goals - 05/14/20 1214      PT LONG TERM GOAL #1   Title Madison Mosley will report low back and R knee pain consistently < 3/10 on the Numeric Pain rating Scale.    Baseline 4/10 (was 5/10)    Time 4    Period Weeks    Status On-going      PT LONG TERM GOAL #2   Title Madison Mosley will be able to stand and walk with minimal soreness/stiffness and participate more fully in her ADLs.    Baseline R posterior knee is sore, although she can now stand from sitting without UE support and get in/out of a boat from the dock.    Time 4    Period Weeks    Status On-going      PT LONG TERM GOAL #3   Title Madison Mosley will improve her spine and leg strength as assessed by improved MMT scores and functional self-report.    Baseline Madison Mosley can now stand from sitting without UE support.  Her knee still is more stable.  Endurance and comfort with stairs and standing after prolonged sitting will benefit from continued work.    Time 4    Period Weeks    Status Partially Met      PT LONG TERM GOAL #4   Title Madison Mosley will be independent with her  long-term DC HEP.    Time 4    Period Weeks    Status On-going                 Plan - 05/16/20 1639    Clinical Impression Statement Madison Mosley noted increased standing and walking this week without an increase in pain.  Pain levels are about the same and this appears to be due to arthritis.  Continued quadriceps strength work should continue to allow Madison Mosley to stand and walk without being stopped by increased pain.    Personal Factors and Comorbidities Comorbidity 1;Comorbidity 2    Comorbidities Cardiac history including 3 stents and cardiac catheterization    Examination-Activity Limitations Bathing;Dressing;Transfers;Bed Mobility;Bend;Lift;Squat;Stairs    Examination-Participation Restrictions Church;Interpersonal Relationship;Cleaning;Laundry;Shop;Community Activity    Stability/Clinical Decision Making Stable/Uncomplicated    Rehab Potential Good    PT Frequency 3x / week    PT Duration 4 weeks    PT Treatment/Interventions ADLs/Self Care Home Management;Moist Heat;Electrical Stimulation;Cryotherapy;Therapeutic activities;Functional mobility training;Stair training;Gait training;Therapeutic exercise;Balance training;Neuromuscular re-education;Patient/family education    PT Next Visit Plan Quadriceps strength progressions to improve stability, stairs and endurance with standing and walking.    PT Home Exercise Plan Seated straight leg raises, sit to stand and heel raises for leg strength along with balance to improve stability.    Consulted and Agree with Plan of Care Patient           Patient will benefit from skilled therapeutic intervention in order to improve the following deficits and impairments:  Abnormal gait, Decreased activity tolerance, Decreased endurance, Decreased range of motion, Decreased mobility, Decreased strength, Impaired flexibility, Pain, Obesity  Visit Diagnosis: Difficulty in walking, not elsewhere classified  Chronic pain of right knee  Chronic  right-sided low back pain without sciatica  Unilateral primary osteoarthritis, right knee     Problem List Patient Active Problem List   Diagnosis Date Noted  . Unilateral primary osteoarthritis, right knee 04/25/2020  . Low back pain 03/21/2020  .  Statin myopathy 11/16/2019  . Atrophic vaginitis 06/18/2017  . Sprain of calcaneofibular ligament of left ankle 01/16/2017  . Obesity (BMI 30.0-34.9) 01/05/2015  . CAD S/P percutaneous coronary angioplasty 04/21/2013  . Hypercholesterolemia 04/21/2013  . Essential hypertension 04/21/2013    Farley Ly PT, MPT 05/16/2020, 4:41 PM  [facility] Physical Therapy [address] [place], [place], [address] Phone: [phone]   Fax:  [phone]  Name: Madison Mosley MRN: [id number] Date of Birth: [birth year]

## 2020-05-22 ENCOUNTER — Ambulatory Visit (INDEPENDENT_AMBULATORY_CARE_PROVIDER_SITE_OTHER): Payer: Medicare Other | Admitting: Physical Therapy

## 2020-05-22 ENCOUNTER — Other Ambulatory Visit: Payer: Self-pay

## 2020-05-22 ENCOUNTER — Encounter: Payer: Self-pay | Admitting: Physical Therapy

## 2020-05-22 DIAGNOSIS — M25561 Pain in right knee: Secondary | ICD-10-CM | POA: Diagnosis not present

## 2020-05-22 DIAGNOSIS — G8929 Other chronic pain: Secondary | ICD-10-CM

## 2020-05-22 DIAGNOSIS — M545 Low back pain: Secondary | ICD-10-CM

## 2020-05-22 DIAGNOSIS — R262 Difficulty in walking, not elsewhere classified: Secondary | ICD-10-CM

## 2020-05-22 DIAGNOSIS — M1711 Unilateral primary osteoarthritis, right knee: Secondary | ICD-10-CM | POA: Diagnosis not present

## 2020-05-22 NOTE — Therapy (Signed)
[facility] Physical Therapy [address] [place], [place], [address] Phone: [phone]   Fax:  [phone]  Physical Therapy Treatment  Patient Details  Name: Madison Mosley MRN: [id number] Date of Birth: [birth year] Referring Provider (PT): Marybelle Killings   Encounter Date: 05/22/2020   PT End of Session - 05/22/20 1009    Visit Number 13    Number of Visits 18    Date for PT Re-Evaluation 05/21/20    PT Start Time 0930    PT Stop Time 3532    PT Time Calculation (min) 45 min    Activity Tolerance Patient tolerated treatment well;No increased pain    Behavior During Therapy WFL for tasks assessed/performed           Past Medical History:  Diagnosis Date   CAD (coronary artery disease)    stents   Hyperlipidemia     Past Surgical History:  Procedure Laterality Date   CARDIAC CATHETERIZATION  2001   Percutaneous revascularization precedure with angioplasty to the proximal LAD and first diagonal    [place]    There were no vitals filed for this visit.   Subjective Assessment - 05/22/20 0947    Subjective she relays about 4/10 knee pain and it is feeling about the same, she says her balance is a work in progress    Limitations Standing;Walking    How long can you sit comfortably? Not limited (NL)    How long can you stand comfortably? 30 minutes    How long can you walk comfortably? 30 minutes    Patient Stated Goals See above    Pain Onset More than a month ago               [facility] Adult PT Treatment/Exercise - 05/22/20 0001      Neuro Re-ed    Neuro Re-ed Details  tandem walk and lateral walk on foam beam up/down X 4 reps ea, SLS 15 sec X 2 bilat      Knee/Hip Exercises: Aerobic   Nustep L6 X 6 min UE/LE      Knee/Hip Exercises: Machines for Strengthening   Cybex Knee Extension 20# 2 sets of 10 double leg and 5# 3 sets of 10 up with both/down R  only slow eccentrics    Cybex Knee Flexion 20 lbs 2X10 bilat    Total Gym Leg Press 112# 3 sets of 10 double leg and 2 sets of 43# single leg                    PT Short Term Goals - 04/12/20 1856      PT SHORT TERM GOAL #1   Title [name] will be independent with her starter HEP within 2 weeks.    Time 2    Period Weeks    Status Achieved    Target Date 04/09/20             PT Long Term Goals - 05/14/20 1214      PT LONG TERM GOAL #1   Title [name] will report low back and R knee pain consistently < 3/10 on the Numeric Pain rating Scale.    Baseline 4/10 (was 5/10)    Time 4    Period Weeks    Status On-going      PT LONG TERM GOAL #2   Title [name] will  be able to stand and walk with minimal soreness/stiffness and participate more fully in her ADLs.    Baseline R posterior knee is sore, although she can now stand from sitting without UE support and get in/out of a boat from the dock.    Time 4    Period Weeks    Status On-going      PT LONG TERM GOAL #3   Title [name] will improve her spine and leg strength as assessed by improved MMT scores and functional self-report.    Baseline [name] can now stand from sitting without UE support.  Her knee still is more stable.  Endurance and comfort with stairs and standing after prolonged sitting will benefit from continued work.    Time 4    Period Weeks    Status Partially Met      PT LONG TERM GOAL #4   Title [name] will be independent with her long-term DC HEP.    Time 4    Period Weeks    Status On-going                 Plan - 05/22/20 1014    Clinical Impression Statement Session focused on quad strength and balance with good tolerance and without complaints. She appears to be making steady progress in all areas but continues to lack balance and strength and will continue to benefit from PT.    Personal Factors and Comorbidities Comorbidity 1;Comorbidity 2    Comorbidities Cardiac history including 3  stents and cardiac catheterization    Examination-Activity Limitations Bathing;Dressing;Transfers;Bed Mobility;Bend;Lift;Squat;Stairs    Examination-Participation Restrictions Church;Interpersonal Relationship;Cleaning;Laundry;Shop;Community Activity    Stability/Clinical Decision Making Stable/Uncomplicated    Rehab Potential Good    PT Frequency 3x / week    PT Duration 4 weeks    PT Treatment/Interventions ADLs/Self Care Home Management;Moist Heat;Electrical Stimulation;Cryotherapy;Therapeutic activities;Functional mobility training;Stair training;Gait training;Therapeutic exercise;Balance training;Neuromuscular re-education;Patient/family education    PT Next Visit Plan Quadriceps strength progressions to improve stability, stairs and endurance with standing and walking.    PT Home Exercise Plan Seated straight leg raises, sit to stand and heel raises for leg strength along with balance to improve stability.    Consulted and Agree with Plan of Care Patient           Patient will benefit from skilled therapeutic intervention in order to improve the following deficits and impairments:  Abnormal gait, Decreased activity tolerance, Decreased endurance, Decreased range of motion, Decreased mobility, Decreased strength, Impaired flexibility, Pain, Obesity  Visit Diagnosis: Difficulty in walking, not elsewhere classified  Chronic pain of right knee  Chronic right-sided low back pain without sciatica  Unilateral primary osteoarthritis, right knee     Problem List Patient Active Problem List   Diagnosis Date Noted   Unilateral primary osteoarthritis, right knee 04/25/2020   Low back pain 03/21/2020   Statin myopathy 11/16/2019   Atrophic vaginitis 06/18/2017   Sprain of calcaneofibular ligament of left ankle 01/16/2017   Obesity (BMI 30.0-34.9) 01/05/2015   CAD S/P percutaneous coronary angioplasty 04/21/2013   Hypercholesterolemia 04/21/2013   Essential hypertension  04/21/2013    Debbe Odea, PT,DPT 05/22/2020, 10:21 AM  [facility] Physical Therapy [address] [place], [place], [address] Phone: [phone]   Fax:  [phone]  Name: Madison Mosley MRN: [id number] Date of Birth: [birth year]

## 2020-05-24 ENCOUNTER — Encounter: Payer: Self-pay | Admitting: Physical Therapy

## 2020-05-24 ENCOUNTER — Ambulatory Visit (INDEPENDENT_AMBULATORY_CARE_PROVIDER_SITE_OTHER): Payer: Medicare Other | Admitting: Physical Therapy

## 2020-05-24 ENCOUNTER — Other Ambulatory Visit: Payer: Self-pay

## 2020-05-24 DIAGNOSIS — M545 Low back pain, unspecified: Secondary | ICD-10-CM

## 2020-05-24 DIAGNOSIS — G8929 Other chronic pain: Secondary | ICD-10-CM

## 2020-05-24 DIAGNOSIS — M25561 Pain in right knee: Secondary | ICD-10-CM | POA: Diagnosis not present

## 2020-05-24 DIAGNOSIS — R262 Difficulty in walking, not elsewhere classified: Secondary | ICD-10-CM

## 2020-05-24 NOTE — Therapy (Signed)
[facility] [address] [place], [place], [address] Phone: [phone]   Fax:  [phone]  Physical Therapy Treatment  Patient Details  Name: Madison Mosley MRN: [id number] Date of Birth: [birth year] Referring Provider (PT): Marybelle Killings   Encounter Date: 05/24/2020   PT End of Session - 05/24/20 1012    Visit Number 14    Number of Visits 18    Date for PT Re-Evaluation 06/21/20    PT Start Time 0930    PT Stop Time 1017    PT Time Calculation (min) 47 min    Activity Tolerance Patient tolerated treatment well;No increased pain    Behavior During Therapy WFL for tasks assessed/performed           Past Medical History:  Diagnosis Date  . CAD (coronary artery disease)    stents  . Hyperlipidemia     Past Surgical History:  Procedure Laterality Date  . CARDIAC CATHETERIZATION  2001   Percutaneous revascularization precedure with angioplasty to the proximal LAD and first diagonal   . CESAREAN SECTION    . CHOLECYSTECTOMY    . PARTIAL HYSTERECTOMY    . TONSILLECTOMY  1960    There were no vitals filed for this visit.   Subjective Assessment - 05/24/20 0957    Subjective relays about 5-6/10 pain behind her Lt knee and that it feels like it is pinching.    Limitations Standing;Walking    How long can you sit comfortably? Not limited (NL)    How long can you stand comfortably? 30 minutes    How long can you walk comfortably? 30 minutes    Patient Stated Goals See above    Pain Onset More than a month ago              [place] PT Assessment - 05/24/20 0001      Assessment   Medical Diagnosis R sided low back and R knee pain    Referring Provider (PT) Rodell Perna C      AROM   Right Knee Extension 0    Right Knee Flexion 124    Left Knee Extension 0    Left Knee Flexion 128      Strength   Right Knee Extension 4-/5    Left Knee Extension 4+/5             [facility] Adult PT Treatment/Exercise - 05/24/20 0001       Neuro Re-ed    Neuro Re-ed Details  --      Knee/Hip Exercises: Stretches   Active Hamstring Stretch Right;3 reps;30 seconds    Active Hamstring Stretch Limitations seated    Gastroc Stretch 4 reps;30 seconds    Gastroc Stretch Limitations slantboard      Knee/Hip Exercises: Aerobic   Nustep L6 X 6 min LE      Knee/Hip Exercises: Machines for Strengthening   Cybex Knee Extension 20# 2 sets of 10 double leg and 5# 2 sets of 10 up with both/down R only slow eccentrics    Cybex Knee Flexion 25 lbs 2X15 bilat    Total Gym Leg Press 112# 3 sets of 10 double leg and 2 sets of 43# single leg      Manual Therapy   Manual therapy comments STM and cross friciton massage to Rt calves/hamstrings in prone with biofreeeze                    PT Short Term Goals -  04/12/20 1856      PT SHORT TERM GOAL #1   Title [name] will be independent with her starter HEP within 2 weeks.    Time 2    Period Weeks    Status Achieved    Target Date 04/09/20             PT Long Term Goals - 05/24/20 1116      PT LONG TERM GOAL #1   Title [name] will report low back and R knee pain consistently < 3/10 on the Numeric Pain rating Scale.    Baseline 4/10 (was 5/10)    Time 4    Period Weeks    Status On-going      PT LONG TERM GOAL #2   Title [name] will be able to stand and walk with minimal soreness/stiffness and participate more fully in her ADLs.    Baseline R posterior knee is sore, although she can now stand from sitting without UE support and get in/out of a boat from the dock.    Time 4    Period Weeks    Status On-going      PT LONG TERM GOAL #3   Title [name] will improve her spine and leg strength as assessed by improved MMT scores and functional self-report.    Baseline [name] can now stand from sitting without UE support.  Her knee still is more stable.  Endurance and comfort with stairs and standing after prolonged sitting will benefit from continued work.    Time 4    Period  Weeks    Status Partially Met      PT LONG TERM GOAL #4   Title [name] will be independent with her long-term DC HEP.    Time 4    Period Weeks    Status On-going                 Plan - 05/24/20 1113    Clinical Impression Statement She continues to complaint of catching in her knee, was trialed with manual therapy and more stretching before exercises which may have helped this some during her strength training today. She still lacks some Rt knee strength and overall balance and would continue to benefit from PT. Pain levels have been up and down lately. She does not have any more PT visits scheduled so she was encouraged to make more if she feels she wants to continue with PT.    Personal Factors and Comorbidities Comorbidity 1;Comorbidity 2    Comorbidities Cardiac history including 3 stents and cardiac catheterization    Examination-Activity Limitations Bathing;Dressing;Transfers;Bed Mobility;Bend;Lift;Squat;Stairs    Examination-Participation Restrictions Church;Interpersonal Relationship;Cleaning;Laundry;Shop;Community Activity    Stability/Clinical Decision Making Stable/Uncomplicated    Rehab Potential Good    PT Frequency 3x / week    PT Duration 4 weeks    PT Treatment/Interventions ADLs/Self Care Home Management;Moist Heat;Electrical Stimulation;Cryotherapy;Therapeutic activities;Functional mobility training;Stair training;Gait training;Therapeutic exercise;Balance training;Neuromuscular re-education;Patient/family education    PT Next Visit Plan Quadriceps strength progressions to improve stability, stairs and endurance with standing and walking.    PT Home Exercise Plan Seated straight leg raises, sit to stand and heel raises for leg strength along with balance to improve stability.    Consulted and Agree with Plan of Care Patient           Patient will benefit from skilled therapeutic intervention in order to improve the following deficits and impairments:  Abnormal  gait, Decreased activity tolerance, Decreased endurance, Decreased range of motion,  Decreased mobility, Decreased strength, Impaired flexibility, Pain, Obesity  Visit Diagnosis: Difficulty in walking, not elsewhere classified  Chronic pain of right knee  Chronic right-sided low back pain without sciatica     Problem List Patient Active Problem List   Diagnosis Date Noted  . Unilateral primary osteoarthritis, right knee 04/25/2020  . Low back pain 03/21/2020  . Statin myopathy 11/16/2019  . Atrophic vaginitis 06/18/2017  . Sprain of calcaneofibular ligament of left ankle 01/16/2017  . Obesity (BMI 30.0-34.9) 01/05/2015  . CAD S/P percutaneous coronary angioplasty 04/21/2013  . Hypercholesterolemia 04/21/2013  . Essential hypertension 04/21/2013    Silvestre Mesi 05/24/2020, 11:16 AM  [facility] Physical Therapy [address] [place], [place], [address] Phone: [phone]   Fax:  [phone]  Name: MELLANIE BEJARANO MRN: [id number] Date of Birth: 1951-03-31

## 2020-05-28 ENCOUNTER — Encounter: Payer: Self-pay | Admitting: Physical Therapy

## 2020-05-28 ENCOUNTER — Other Ambulatory Visit: Payer: Self-pay

## 2020-05-28 ENCOUNTER — Ambulatory Visit (INDEPENDENT_AMBULATORY_CARE_PROVIDER_SITE_OTHER): Payer: Medicare Other | Admitting: Physical Therapy

## 2020-05-28 DIAGNOSIS — R262 Difficulty in walking, not elsewhere classified: Secondary | ICD-10-CM

## 2020-05-28 DIAGNOSIS — M545 Low back pain, unspecified: Secondary | ICD-10-CM

## 2020-05-28 DIAGNOSIS — M25561 Pain in right knee: Secondary | ICD-10-CM

## 2020-05-28 DIAGNOSIS — G8929 Other chronic pain: Secondary | ICD-10-CM | POA: Diagnosis not present

## 2020-05-28 NOTE — Therapy (Signed)
[facility] Physical Therapy [address] [place], [place], [address] Phone: [phone]   Fax:  [phone]  Physical Therapy Treatment  Patient Details  Name: SKYLEE BAIRD MRN: [id number] Date of Birth: 1951-05-03 Referring Provider (PT): Marybelle Killings   Encounter Date: 05/28/2020   PT End of Session - 05/28/20 1344    Visit Number 15    Number of Visits 18    Date for PT Re-Evaluation 06/21/20    PT Start Time 7290    PT Stop Time 1350    PT Time Calculation (min) 45 min    Activity Tolerance Patient tolerated treatment well;No increased pain    Behavior During Therapy WFL for tasks assessed/performed           Past Medical History:  Diagnosis Date   CAD (coronary artery disease)    stents   Hyperlipidemia     Past Surgical History:  Procedure Laterality Date   CARDIAC CATHETERIZATION  2001   Percutaneous revascularization precedure with angioplasty to the proximal LAD and first diagonal    [place]    There were no vitals filed for this visit.   Subjective Assessment - 05/28/20 1330    Subjective relays about 4-5/10 pain behind her Lt knee, did not notice any difference with biofreeze    Limitations Standing;Walking    How long can you sit comfortably? Not limited (NL)    How long can you stand comfortably? 30 minutes    How long can you walk comfortably? 30 minutes    Patient Stated Goals See above    Pain Onset More than a month ago                             [facility] Adult PT Treatment/Exercise - 05/28/20 0001      Knee/Hip Exercises: Stretches   Gastroc Stretch Both;3 reps;30 seconds    Gastroc Stretch Limitations slantboard      Knee/Hip Exercises: Machines for Strengthening   Cybex Knee Extension 20# 2 sets of 10 double leg    Cybex Knee Flexion 25 lbs 2X15 bilat    Total Gym Leg Press 112# 3 sets of 10 double leg and 2  sets of 43# single leg      Knee/Hip Exercises: Standing   Heel Raises Limitations heel toe raises X 20 reps ea    Functional Squat Limitations mini squats with UE support 2X10      Modalities   Modalities Moist Heat;Ultrasound      Moist Heat Therapy   Number Minutes Moist Heat 7 Minutes    Moist Heat Location Knee      Ultrasound   Ultrasound Location Rt posterior knee    Ultrasound Parameters 1.2 w/cm2, 1 mhz, 100% 8 min    Ultrasound Goals Pain                    PT Short Term Goals - 04/12/20 1856      PT SHORT TERM GOAL #1   Title [name] will be independent with her starter HEP within 2 weeks.    Time 2    Period Weeks    Status Achieved    Target Date 04/09/20             PT Long Term Goals - 05/24/20 1116  PT LONG TERM GOAL #1   Title [name] will report low back and R knee pain consistently < 3/10 on the Numeric Pain rating Scale.    Baseline 4/10 (was 5/10)    Time 4    Period Weeks    Status On-going      PT LONG TERM GOAL #2   Title [name] will be able to stand and walk with minimal soreness/stiffness and participate more fully in her ADLs.    Baseline R posterior knee is sore, although she can now stand from sitting without UE support and get in/out of a boat from the dock.    Time 4    Period Weeks    Status On-going      PT LONG TERM GOAL #3   Title [name] will improve her spine and leg strength as assessed by improved MMT scores and functional self-report.    Baseline [name] can now stand from sitting without UE support.  Her knee still is more stable.  Endurance and comfort with stairs and standing after prolonged sitting will benefit from continued work.    Time 4    Period Weeks    Status Partially Met      PT LONG TERM GOAL #4   Title [name] will be independent with her long-term DC HEP.    Time 4    Period Weeks    Status On-going                 Plan - 05/28/20 1347    Clinical Impression Statement Trialed [place]  today as still has pain and catching in her posterior knee, will asses her response to this next session and if this does not help may try ionto. Continued with stretching and leg strengthening as able.    Personal Factors and Comorbidities Comorbidity 1;Comorbidity 2    Comorbidities Cardiac history including 3 stents and cardiac catheterization    Examination-Activity Limitations Bathing;Dressing;Transfers;Bed Mobility;Bend;Lift;Squat;Stairs    Examination-Participation Restrictions Church;Interpersonal Relationship;Cleaning;Laundry;Shop;Community Activity    Stability/Clinical Decision Making Stable/Uncomplicated    Rehab Potential Good    PT Frequency 3x / week    PT Duration 4 weeks    PT Treatment/Interventions ADLs/Self Care Home Management;Moist Heat;Electrical Stimulation;Cryotherapy;Therapeutic activities;Functional mobility training;Stair training;Gait training;Therapeutic exercise;Balance training;Neuromuscular re-education;Patient/family education    PT Next Visit Plan Quadriceps strength progressions to improve stability, stairs and endurance with standing and walking.    PT Home Exercise Plan Seated straight leg raises, sit to stand and heel raises for leg strength along with balance to improve stability.    Consulted and Agree with Plan of Care Patient           Patient will benefit from skilled therapeutic intervention in order to improve the following deficits and impairments:  Abnormal gait, Decreased activity tolerance, Decreased endurance, Decreased range of motion, Decreased mobility, Decreased strength, Impaired flexibility, Pain, Obesity  Visit Diagnosis: Difficulty in walking, not elsewhere classified  Chronic pain of right knee  Chronic right-sided low back pain without sciatica     Problem List Patient Active Problem List   Diagnosis Date Noted   Unilateral primary osteoarthritis, right knee 04/25/2020   Low back pain 03/21/2020   Statin myopathy  11/16/2019   Atrophic vaginitis 06/18/2017   Sprain of calcaneofibular ligament of left ankle 01/16/2017   Obesity (BMI 30.0-34.9) 01/05/2015   CAD S/P percutaneous coronary angioplasty 04/21/2013   Hypercholesterolemia 04/21/2013   Essential hypertension 04/21/2013    Marrianne Mood Tremon Sainvil,PT,DPT 05/28/2020, 2:10 PM  [facility] Physical Therapy [address] [place], [place], [address] Phone: [phone]   Fax:  [phone]  Name: LASHEIKA ORTLOFF MRN: [id number] Date of Birth: [birth year]

## 2020-05-30 ENCOUNTER — Encounter: Payer: Self-pay | Admitting: Physical Therapy

## 2020-05-30 ENCOUNTER — Other Ambulatory Visit: Payer: Self-pay

## 2020-05-30 ENCOUNTER — Ambulatory Visit (INDEPENDENT_AMBULATORY_CARE_PROVIDER_SITE_OTHER): Payer: Medicare Other | Admitting: Physical Therapy

## 2020-05-30 DIAGNOSIS — G8929 Other chronic pain: Secondary | ICD-10-CM | POA: Diagnosis not present

## 2020-05-30 DIAGNOSIS — M1711 Unilateral primary osteoarthritis, right knee: Secondary | ICD-10-CM | POA: Diagnosis not present

## 2020-05-30 DIAGNOSIS — M545 Low back pain, unspecified: Secondary | ICD-10-CM

## 2020-05-30 DIAGNOSIS — M25561 Pain in right knee: Secondary | ICD-10-CM

## 2020-05-30 DIAGNOSIS — R262 Difficulty in walking, not elsewhere classified: Secondary | ICD-10-CM

## 2020-05-30 NOTE — Therapy (Addendum)
[facility] Physical Therapy [address] [place], [place], [address] Phone: [phone]   Fax:  [phone]  Physical Therapy Treatment  Patient Details  Name: Madison Mosley MRN: [id number] Date of Birth: Jun 14, 1951 Referring Provider (PT): Marybelle Killings   Encounter Date: 05/30/2020   PT End of Session - 05/30/20 1657    Visit Number 16    Number of Visits 18    Date for PT Re-Evaluation 06/21/20    PT Start Time 5638    PT Stop Time 7564    PT Time Calculation (min) 42 min    Activity Tolerance Patient tolerated treatment well;No increased pain    Behavior During Therapy WFL for tasks assessed/performed           Past Medical History:  Diagnosis Date  . CAD (coronary artery disease)    stents  . Hyperlipidemia     Past Surgical History:  Procedure Laterality Date  . CARDIAC CATHETERIZATION  2001   Percutaneous revascularization precedure with angioplasty to the proximal LAD and first diagonal   . CESAREAN SECTION    . CHOLECYSTECTOMY    . PARTIAL HYSTERECTOMY    . TONSILLECTOMY  1960    There were no vitals filed for this visit.   Subjective Assessment - 05/30/20 1652    Subjective relays pain was really bad last night behind her Rt knee, it has eased off some but still 6/10 overall    Limitations Standing;Walking    How long can you sit comfortably? Not limited (NL)    How long can you stand comfortably? 30 minutes    How long can you walk comfortably? 30 minutes    Patient Stated Goals See above    Pain Onset More than a month ago            [facility] Adult PT Treatment/Exercise - 05/30/20 0001      Knee/Hip Exercises: Stretches   Active Hamstring Stretch Right;3 reps;30 seconds    Active Hamstring Stretch Limitations seated    Gastroc Stretch Both;3 reps;30 seconds    Gastroc Stretch Limitations slantboard      Knee/Hip Exercises: Aerobic   Recumbent Bike 6 min L1      Knee/Hip Exercises: Machines for Strengthening   Cybex  Knee Extension 20# 2 sets of 10 double leg    Cybex Knee Flexion 25 lbs 2X15 bilat    Total Gym Leg Press 2 sets of 15 for 43# single leg  then 112# 2 sets of 15 double leg      Knee/Hip Exercises: Standing   Heel Raises Limitations heel toe raises X 20 reps ea    Other Standing Knee Exercises deadlift with 5 lb KB from 6 inch step 2X10 reps      Modalities   Modalities Iontophoresis      Iontophoresis   Type of Iontophoresis Dexamethasone    Location Rt posterior knee    Dose 1.0cc    Time 4-6 hour wear home patch                  PT Education - 05/30/20 1657    Education Details ionto education    Person(s) Educated Patient    Methods Explanation    Comprehension Verbalized understanding            PT Short Term Goals - 04/12/20 1856      PT SHORT TERM GOAL #1   Title Madison Mosley will be independent with her starter HEP within 2  weeks.    Time 2    Period Weeks    Status Achieved    Target Date 04/09/20             PT Long Term Goals - 05/24/20 1116      PT LONG TERM GOAL #1   Title Madison Mosley will report low back and R knee pain consistently < 3/10 on the Numeric Pain rating Scale.    Baseline 4/10 (was 5/10)    Time 4    Period Weeks    Status On-going      PT LONG TERM GOAL #2   Title Madison Mosley will be able to stand and walk with minimal soreness/stiffness and participate more fully in her ADLs.    Baseline R posterior knee is sore, although she can now stand from sitting without UE support and get in/out of a boat from the dock.    Time 4    Period Weeks    Status On-going      PT LONG TERM GOAL #3   Title Madison Mosley will improve her spine and leg strength as assessed by improved MMT scores and functional self-report.    Baseline Madison Mosley can now stand from sitting without UE support.  Her knee still is more stable.  Endurance and comfort with stairs and standing after prolonged sitting will benefit from continued work.    Time 4    Period Weeks    Status  Partially Met      PT LONG TERM GOAL #4   Title Madison Mosley will be independent with her long-term DC HEP.    Time 4    Period Weeks    Status On-going                 Plan - 05/30/20 1658    Clinical Impression Statement Trialed Intophoresis today to posterior knee to reduce pain and inflammaiton due to continued complaints. She is however progressing overal strength. PT will assess her response to ionto next visit.    Personal Factors and Comorbidities Comorbidity 1;Comorbidity 2    Comorbidities Cardiac history including 3 stents and cardiac catheterization    Examination-Activity Limitations Bathing;Dressing;Transfers;Bed Mobility;Bend;Lift;Squat;Stairs    Examination-Participation Restrictions Church;Interpersonal Relationship;Cleaning;Laundry;Shop;Community Activity    Stability/Clinical Decision Making Stable/Uncomplicated    Rehab Potential Good    PT Frequency 3x / week    PT Duration 4 weeks    PT Treatment/Interventions ADLs/Self Care Home Management;Moist Heat;Electrical Stimulation;Cryotherapy;Therapeutic activities;Functional mobility training;Stair training;Gait training;Therapeutic exercise;Balance training;Neuromuscular re-education;Patient/family education    PT Next Visit Plan Quadriceps strength progressions to improve stability, stairs and endurance with standing and walking.    PT Home Exercise Plan Seated straight leg raises, sit to stand and heel raises for leg strength along with balance to improve stability.    Consulted and Agree with Plan of Care Patient           Patient will benefit from skilled therapeutic intervention in order to improve the following deficits and impairments:  Abnormal gait, Decreased activity tolerance, Decreased endurance, Decreased range of motion, Decreased mobility, Decreased strength, Impaired flexibility, Pain, Obesity  Visit Diagnosis: Difficulty in walking, not elsewhere classified  Chronic pain of right knee  Chronic  right-sided low back pain without sciatica  Unilateral primary osteoarthritis, right knee     Problem List Patient Active Problem List   Diagnosis Date Noted  . Unilateral primary osteoarthritis, right knee 04/25/2020  . Low back pain 03/21/2020  . Statin myopathy 11/16/2019  . Atrophic vaginitis  06/18/2017  . Sprain of calcaneofibular ligament of left ankle 01/16/2017  . Obesity (BMI 30.0-34.9) 01/05/2015  . CAD S/P percutaneous coronary angioplasty 04/21/2013  . Hypercholesterolemia 04/21/2013  . Essential hypertension 04/21/2013    Silvestre Mesi 05/30/2020, 4:59 PM   PHYSICAL THERAPY DISCHARGE SUMMARY  Visits from Start of Care: 16  Current functional level related to goals / functional outcomes: Improved   Remaining deficits: See note   Education / Equipment: HEP Plan: Patient agrees to discharge.  Patient goals were partially met. Patient is being discharged due to not returning since the last visit.  ?????    Farley Ly PT, MPT  [facility] Physical Therapy [address] [place], [place], [address] Phone: [phone]   Fax:  [phone]  Name: Madison Mosley MRN: [id number] Date of Birth: [birth year]

## 2020-08-03 ENCOUNTER — Ambulatory Visit: Payer: Medicare Other | Admitting: Orthopaedic Surgery

## 2020-08-21 ENCOUNTER — Ambulatory Visit (INDEPENDENT_AMBULATORY_CARE_PROVIDER_SITE_OTHER): Payer: Medicare Other | Admitting: Orthopaedic Surgery

## 2020-08-21 ENCOUNTER — Encounter: Payer: Self-pay | Admitting: Orthopaedic Surgery

## 2020-08-21 VITALS — Ht 68.0 in

## 2020-08-21 DIAGNOSIS — M25561 Pain in right knee: Secondary | ICD-10-CM | POA: Diagnosis not present

## 2020-08-21 NOTE — Progress Notes (Signed)
69 year old white female history of right knee pain mechanical symptoms returns for recheck.  States that she is walking somewhat better but continues have ongoing pain and feeling mechanical symptoms.  Pain also posterior knee.  Recently went to the beach and had limitation and discomfort with this.   Exam Pleasant female alert and oriented in no acute distress.  Right knee she is exquisitely tender at the medial joint line.  Pain with McMurray's testing.  Popliteal tenderness.   Plan For her ongoing right knee pain mechanical symptoms will schedule MRI to rule out meniscal tear and evaluate the extent of her chondromalacia.  Follow-up Dr. Ophelia Charter after completion to discuss results and further treatment options.

## 2020-09-12 ENCOUNTER — Ambulatory Visit
Admission: RE | Admit: 2020-09-12 | Discharge: 2020-09-12 | Disposition: A | Discharge status: Home / Self Care | Payer: Medicare Other | Source: Ambulatory Visit | Attending: Surgery | Admitting: Surgery

## 2020-09-12 DIAGNOSIS — M25561 Pain in right knee: Secondary | ICD-10-CM | POA: Diagnosis not present

## 2020-09-13 DIAGNOSIS — J0100 Acute maxillary sinusitis, unspecified: Secondary | ICD-10-CM | POA: Diagnosis not present

## 2020-09-18 ENCOUNTER — Ambulatory Visit: Payer: Medicare Other | Admitting: Orthopaedic Surgery

## 2020-09-21 ENCOUNTER — Ambulatory Visit
Admission: EM | Admit: 2020-09-21 | Discharge: 2020-09-21 | Disposition: A | Discharge status: Home / Self Care | Payer: Medicare Other | Attending: Emergency Medicine | Admitting: Emergency Medicine

## 2020-09-21 ENCOUNTER — Other Ambulatory Visit: Payer: Self-pay

## 2020-09-21 ENCOUNTER — Ambulatory Visit (INDEPENDENT_AMBULATORY_CARE_PROVIDER_SITE_OTHER): Payer: Medicare Other

## 2020-09-21 ENCOUNTER — Encounter: Payer: Self-pay | Admitting: Emergency Medicine

## 2020-09-21 DIAGNOSIS — U071 COVID-19: Secondary | ICD-10-CM

## 2020-09-21 DIAGNOSIS — R0600 Dyspnea, unspecified: Secondary | ICD-10-CM

## 2020-09-21 DIAGNOSIS — R059 Cough, unspecified: Secondary | ICD-10-CM | POA: Diagnosis not present

## 2020-09-21 DIAGNOSIS — R0902 Hypoxemia: Secondary | ICD-10-CM

## 2020-09-21 MED ORDER — PREDNISONE 50 MG PO TABS: 50.0000 mg | ORAL_TABLET | Freq: Every day | ORAL | 0 refills | Status: DC

## 2020-09-21 MED ORDER — AZITHROMYCIN 250 MG PO TABS: 250.0000 mg | ORAL_TABLET | Freq: Every day | ORAL | 0 refills | Status: DC

## 2020-09-21 MED ORDER — ALBUTEROL SULFATE HFA 108 (90 BASE) MCG/ACT IN AERS: 2.0000 | INHALATION_SPRAY | Freq: Four times a day (QID) | RESPIRATORY_TRACT | 2 refills | Status: DC | PRN

## 2020-09-21 MED ORDER — AEROCHAMBER PLUS FLO-VU MEDIUM MISC: 1.0000 | Freq: Once | 0 refills | Status: AC

## 2020-09-21 MED ORDER — BENZONATATE 100 MG PO CAPS: 100.0000 mg | ORAL_CAPSULE | Freq: Three times a day (TID) | ORAL | 0 refills | Status: DC

## 2020-09-21 NOTE — ED Provider Notes (Addendum)
[facility]    CSN: [id number] Arrival date & time: 09/21/20  1232      History   Chief Complaint Chief Complaint  Patient presents with  . covid positive-cough    HPI Madison Mosley is a 69 y.o. female  Madison Mosley is a 69 y.o. female here for evaluation of a cough.  The cough is non-productive, with shortness of breath, worsening over time and is aggravated by unknown. Onset of symptoms was 10 days ago, gradually worsening since that time.  Associated symptoms include shortness of breath. Patient does not have a history of asthma. Patient has not had recent travel. Patient does not have a history of smoking. Patient  has not had a previous chest x-ray. Patient has not had a PPD done.  Is covid positive.  Denying active SOB, CP. The following portions of the patient's history were reviewed and updated as appropriate: allergies, current medications, past family history, past medical history, past social history, past surgical history and problem list.     Past Medical History:  Diagnosis Date  . CAD (coronary artery disease)    stents  . Hyperlipidemia     Patient Active Problem List   Diagnosis Date Noted  . Unilateral primary osteoarthritis, right knee 04/25/2020  . Low back pain 03/21/2020  . Statin myopathy 11/16/2019  . Atrophic vaginitis 06/18/2017  . Sprain of calcaneofibular ligament of left ankle 01/16/2017  . Obesity (BMI 30.0-34.9) 01/05/2015  . CAD S/P percutaneous coronary angioplasty 04/21/2013  . Hypercholesterolemia 04/21/2013  . Essential hypertension 04/21/2013    Past Surgical History:  Procedure Laterality Date  . CARDIAC CATHETERIZATION  2001   Percutaneous revascularization precedure with angioplasty to the proximal LAD and first diagonal   . CESAREAN SECTION    . CHOLECYSTECTOMY    . PARTIAL HYSTERECTOMY    . TONSILLECTOMY  1960    OB History   No obstetric history on file.      Home Medications    Prior to  Admission medications   Medication Sig Start Date End Date Taking? Authorizing Provider  albuterol (VENTOLIN HFA) 108 (90 Base) MCG/ACT inhaler Inhale 2 puffs into the lungs every 6 (six) hours as needed for wheezing or shortness of breath. 09/21/20   Hall-Potvin, [place], PA-C  aspirin EC 81 MG tablet Take 1 tablet (81 mg total) by mouth daily. 10/30/16   Croitoru, Mihai, MD  azithromycin (ZITHROMAX) 250 MG tablet Take 1 tablet (250 mg total) by mouth daily. Take first 2 tablets together, then 1 every day until finished. 09/21/20   Hall-Potvin, [place], PA-C  benzonatate (TESSALON) 100 MG capsule Take 1 capsule (100 mg total) by mouth every 8 (eight) hours. 09/21/20   Hall-Potvin, [place], PA-C  metoprolol succinate (TOPROL-XL) 50 MG 24 hr tablet Take 25 mg by mouth daily. 03/10/13   [provider]  Multiple Vitamin (MULTIVITAMIN WITH MINERALS) TABS Take 1 tablet by mouth daily.    [provider]  Potassium (POTASSIMIN PO) Take 1 tablet by mouth daily.    [provider]  pravastatin (PRAVACHOL) 20 MG tablet Take 1 tablet by mouth daily. 03/10/13   [provider]  predniSONE (DELTASONE) 50 MG tablet Take 1 tablet (50 mg total) by mouth daily with breakfast. 09/21/20   Hall-Potvin, [place], PA-C  Spacer/Aero-Holding Chambers (AEROCHAMBER PLUS FLO-VU MEDIUM) MISC 1 each by Other route once for 1 dose. 09/21/20 09/21/20  Hall-Potvin, [place], PA-C  triamcinolone cream (KENALOG) 0.1 % as needed. 04/13/15  [provider]  triamterene-hydrochlorothiazide (MAXZIDE-25) 37.5-25 MG per tablet Take 0.5 tablets by mouth daily.  03/10/13   [provider]    Family History Family History  Problem Relation Age of Onset  . COPD Mother   . Heart failure Mother   . Diabetes Mother   . Valvular heart disease Mother        mitral valve leakage  . Heart attack Father 51       multiple heart attacks  . Heart attack Sister 65  . CAD Brother 32  . Heart  attack Maternal Grandfather   . Heart attack Paternal Grandfather        multiple hearts attacks  . Stroke Paternal Grandfather     Social History Social History   Tobacco Use  . Smoking status: Never Smoker  . Smokeless tobacco: Never Used  Substance Use Topics  . Alcohol use: No    Alcohol/week: 0.0 standard drinks  . Drug use: No     Allergies   Other, Crestor  [rosuvastatin calcium], Ezetimibe, Pitavastatin, Welchol  [colesevelam hcl], Zocor  [simvastatin], and Naproxen   Review of Systems Review of Systems  Constitutional: Negative for fatigue and fever.  HENT: Negative for ear pain, sinus pain, sore throat and voice change.   Eyes: Negative for pain, redness and visual disturbance.  Respiratory: Positive for cough, chest tightness and shortness of breath. Negative for wheezing.   Cardiovascular: Negative for chest pain and palpitations.  Gastrointestinal: Negative for abdominal pain, diarrhea and vomiting.  Musculoskeletal: Negative for arthralgias and myalgias.  Skin: Negative for rash and wound.  Neurological: Negative for syncope and headaches.     Physical Exam Triage Vital Signs ED Triage Vitals  Enc Vitals Group     BP      Pulse      Resp      Temp      Temp src      SpO2      Weight      Height      Head Circumference      Peak Flow      Pain Score      Pain Loc      Pain Edu?      Excl. in GC?    No data found.  Updated Vital Signs BP 123/70 (BP Location: Left Arm)   Pulse 98   Temp 99.1 F (37.3 C) (Oral)   Resp 20   SpO2 92%   Visual Acuity Right Eye Distance:   Left Eye Distance:   Bilateral Distance:    Right Eye Near:   Left Eye Near:    Bilateral Near:     Physical Exam Constitutional:      General: She is not in acute distress.    Appearance: She is not ill-appearing or diaphoretic.     Comments: Sitting comfortably  HENT:     Head: Normocephalic and atraumatic.     Mouth/Throat:     Mouth: Mucous membranes are  moist.     Pharynx: Oropharynx is clear. No oropharyngeal exudate or posterior oropharyngeal erythema.  Eyes:     General: No scleral icterus.    Conjunctiva/sclera: Conjunctivae normal.     Pupils: Pupils are equal, round, and reactive to light.  Neck:     Comments: Trachea midline, negative JVD Cardiovascular:     Rate and Rhythm: Normal rate and regular rhythm.     Heart sounds: No murmur heard.  No gallop.  Pulmonary:     Effort: Pulmonary effort is normal. No respiratory distress.     Breath sounds: Normal breath sounds. No stridor. No wheezing, rhonchi or rales.     Comments: SpO2 92% w/ mask off Musculoskeletal:     Cervical back: Neck supple. No tenderness.  Lymphadenopathy:     Cervical: No cervical adenopathy.  Skin:    Capillary Refill: Capillary refill takes less than 2 seconds.     Coloration: Skin is not jaundiced or pale.     Findings: No rash.  Neurological:     General: No focal deficit present.     Mental Status: She is alert and oriented to person, place, and time.      UC Treatments / Results  Labs (all labs ordered are listed, but only abnormal results are displayed) Labs Reviewed - No data to display  EKG   Radiology DG Chest 2 View  Result Date: 09/21/2020 CLINICAL DATA:  69 year old female with increasing cough and hypoxia on day 10 of COVID-19. EXAM: CHEST - 2 VIEW COMPARISON:  Chest radiographs 01/18/2017. FINDINGS: Lung volumes and mediastinal contours are stable and within normal limits. Bilateral peripheral and indistinct pulmonary opacity is new since 2018 and a typical appearance of COVID-19 pneumonia. No superimposed pneumothorax, pulmonary edema or pleural effusion. No areas of consolidation identified. Visualized tracheal air column is within normal limits. No acute osseous abnormality identified. Paucity of bowel gas in the upper abdomen. IMPRESSION: Patchy and indistinct bilateral pulmonary opacity typical for COVID-19 pneumonia. No  pleural effusion or other acute cardiopulmonary abnormality. Electronically Signed   By: Odessa FlemingH  Hall M.D.   On: 09/21/2020 13:11    Procedures Procedures (including critical care time)  Medications Ordered in UC Medications - No data to display  Initial Impression / Assessment and Plan / UC Course  I have reviewed the triage vital signs and the nursing notes.  Pertinent labs & imaging results that were available during my care of the patient were reviewed by me and considered in my medical decision making (see chart for details).     Patient afebrile, nontoxic, with SpO2 92%.  No respiratory distress in office.  CXR patchy and indistinct bilateral opacities typical for COVID-19 pneumonia.  No pleural effusion or other acute cardiopulmonary abnormalities.  Reviewed findings with patient and daughter who verbalized understanding.  Will treat supportively as below, cover for atypical infection with Z-Pak, and have close follow-up with PCP.  ER return precautions discussed, patient verbalized understanding and is agreeable to plan. Final Clinical Impressions(s) / UC Diagnoses   Final diagnoses:  COVID-19  Dyspnea, unspecified type  Cough     Discharge Instructions     Go to ER for worsening breathing, dizziness, chest pain.    ED Prescriptions    Medication Sig Dispense Auth. Provider   albuterol (VENTOLIN HFA) 108 (90 Base) MCG/ACT inhaler Inhale 2 puffs into the lungs every 6 (six) hours as needed for wheezing or shortness of breath. 8 g Hall-Potvin, [place], PA-C   Spacer/Aero-Holding Chambers (AEROCHAMBER PLUS FLO-VU MEDIUM) MISC 1 each by Other route once for 1 dose. 1 each Hall-Potvin, [place], PA-C   benzonatate (TESSALON) 100 MG capsule Take 1 capsule (100 mg total) by mouth every 8 (eight) hours. 21 capsule Hall-Potvin, [place], PA-C   azithromycin (ZITHROMAX) 250 MG tablet Take 1 tablet (250 mg total) by mouth daily. Take first 2 tablets together, then 1 every day until  finished. 6 tablet Hall-Potvin, [place], PA-C   predniSONE (DELTASONE) 50  MG tablet Take 1 tablet (50 mg total) by mouth daily with breakfast. 5 tablet Hall-Potvin, [place], PA-C     PDMP not reviewed this encounter.   Hall-Potvin, [place], PA-C 09/21/20 1334    Hall-Potvin, [place], [place] 09/21/20 1335

## 2020-09-21 NOTE — Discharge Instructions (Addendum)
Go to ER for worsening breathing, dizziness, chest pain.

## 2020-09-21 NOTE — ED Triage Notes (Signed)
Pt states covid positive x10 days. Pt c/o increase cough and SOB. Daughter states her o2 levels has been 93 except today 86-88%. Pt in no distress, speaking in complete sentences. States has not received infusion or vaccines.

## 2020-10-02 ENCOUNTER — Encounter: Payer: Self-pay | Admitting: Orthopaedic Surgery

## 2020-10-02 ENCOUNTER — Ambulatory Visit (INDEPENDENT_AMBULATORY_CARE_PROVIDER_SITE_OTHER): Payer: Medicare Other | Admitting: Orthopaedic Surgery

## 2020-10-02 VITALS — Ht 68.0 in | Wt 225.0 lb

## 2020-10-02 DIAGNOSIS — M1711 Unilateral primary osteoarthritis, right knee: Secondary | ICD-10-CM | POA: Diagnosis not present

## 2020-10-02 DIAGNOSIS — S83206A Unspecified tear of unspecified meniscus, current injury, right knee, initial encounter: Secondary | ICD-10-CM | POA: Insufficient documentation

## 2020-10-02 DIAGNOSIS — S83241D Other tear of medial meniscus, current injury, right knee, subsequent encounter: Secondary | ICD-10-CM | POA: Diagnosis not present

## 2020-10-02 NOTE — Progress Notes (Signed)
Office Visit Note   Patient: Madison Mosley           Date of Birth: 1951/06/12           MRN: [id number] Visit Date: 10/02/2020              Requested by: Merri Brunette, MD [address] [place],  [place] [address] PCP: Merri Brunette, MD   Assessment & Plan: Visit Diagnoses:  1. Unilateral primary osteoarthritis, right knee   2. Tear of medial meniscus of right knee, current, unspecified tear type, subsequent encounter     Plan: Currently patient not symptomatic from her right knee meniscal tear.  If she develops locking or increased pain she will call and let us know.  Follow-up as needed.  Follow-Up Instructions: No follow-ups on file.   Orders:  No orders of the defined types were placed in this encounter.  No orders of the defined types were placed in this encounter.     Procedures: No procedures performed   Clinical Data: No additional findings.   Subjective: Chief Complaint  Patient presents with  . Right Knee - Follow-up    MRI right knee review    HPI 69 year old female returns post MRI scan of her knee.  This shows complex posterior meniscal tear with displacement 1.5 cm.  She does have some patellofemoral degenerative changes.  Midportion medial meniscus is partially extruded.  She states she got Covid from her granddaughter who is age 70.  Her daughter is a pediatric ICU nurse and patient states that her code was treated with antibiotics prednisone and antibiotic treatment.  She states after the prednisone treatment she states her knee has been doing well and not giving any problems whatsoever.  She is walking no catching no giving way no swelling.  Review of Systems all other systems noncontributory to HPI.   Objective: Vital Signs: Ht 5\' 8"  (1.727 m)   Wt 225 lb (102.1 kg)   BMI 34.21 kg/m   Physical Exam Constitutional:      Appearance: She is well-developed.  HENT:     Head: Normocephalic.     Right Ear: External ear normal.      Left Ear: External ear normal.  Eyes:     Pupils: Pupils are equal, round, and reactive to light.  Neck:     Thyroid: No thyromegaly.     Trachea: No tracheal deviation.  Cardiovascular:     Rate and Rhythm: Normal rate.  Pulmonary:     Effort: Pulmonary effort is normal.  Abdominal:     Palpations: Abdomen is soft.  Skin:    General: Skin is warm and dry.  Neurological:     Mental Status: She is alert and oriented to person, place, and time.  Psychiatric:        Behavior: Behavior normal.     Ortho Exam patient has normal gait no knee swelling no limping.  Negative logroll to the hips. Specialty Comments:  No specialty comments available.  Imaging: CLINICAL DATA:  Right knee pain and buckling for approximately 1 year. No known injury.  EXAM: MRI OF THE RIGHT KNEE WITHOUT CONTRAST  TECHNIQUE: Multiplanar, multisequence MR imaging of the knee was performed. No intravenous contrast was administered.  COMPARISON:  Plain films right knee 10/19/2019.  FINDINGS: MENISCI  Medial meniscus: There is a complete radial tear through the root of the posterior horn with approximately 1.5 cm of displacement.  Lateral meniscus: Intrasubstance degenerative signal is seen  in the root of the posterior horn and there is fraying along the free edge of the body.  LIGAMENTS  Cruciates:  Intact.  Collaterals:  Intact.  CARTILAGE  Patellofemoral:  Markedly thinned throughout.  Medial:  Thinning is most notable along the medial tibial plateau.  Lateral:  Mildly to moderately degenerated.  Joint:  Small effusion.  Popliteal Fossa:  No Baker's cyst.  Extensor Mechanism:  Intact.  Bones: No fracture, stress change or worrisome lesion. Osteophytosis is present about the knee.  Other: None.  IMPRESSION: Complete and displaced radial tear through the root of the posterior horn of the medial meniscus.  The posterior horn of the lateral meniscus is  degenerated with fraying along the free edge of the body but no tear is identified.  Osteoarthritis about the knee appears worst in the patellofemoral compartment.   Electronically Signed   By: Drusilla Kanner M.D.   On: 09/12/2020 13:34   PMFS History: Patient Active Problem List   Diagnosis Date Noted  . Right knee meniscal tear 10/02/2020  . Unilateral primary osteoarthritis, right knee 04/25/2020  . Low back pain 03/21/2020  . Statin myopathy 11/16/2019  . Atrophic vaginitis 06/18/2017  . Sprain of calcaneofibular ligament of left ankle 01/16/2017  . Obesity (BMI 30.0-34.9) 01/05/2015  . CAD S/P percutaneous coronary angioplasty 04/21/2013  . Hypercholesterolemia 04/21/2013  . Essential hypertension 04/21/2013   Past Medical History:  Diagnosis Date  . CAD (coronary artery disease)    stents  . Hyperlipidemia     Family History  Problem Relation Age of Onset  . COPD Mother   . Heart failure Mother   . Diabetes Mother   . Valvular heart disease Mother        mitral valve leakage  . Heart attack Father 13       multiple heart attacks  . Heart attack Sister 32  . CAD Brother 68  . Heart attack Maternal Grandfather   . Heart attack Paternal Grandfather        multiple hearts attacks  . Stroke Paternal Grandfather     Past Surgical History:  Procedure Laterality Date  . CARDIAC CATHETERIZATION  2001   Percutaneous revascularization precedure with angioplasty to the proximal LAD and first diagonal   . CESAREAN SECTION    . CHOLECYSTECTOMY    . PARTIAL HYSTERECTOMY    . TONSILLECTOMY  1960   Social History   Occupational History  . Not on file  Tobacco Use  . Smoking status: Never Smoker  . Smokeless tobacco: Never Used  Substance and Sexual Activity  . Alcohol use: No    Alcohol/week: 0.0 standard drinks  . Drug use: No  . Sexual activity: Not on file

## 2020-10-29 DIAGNOSIS — E7800 Pure hypercholesterolemia, unspecified: Secondary | ICD-10-CM | POA: Diagnosis not present

## 2020-10-29 DIAGNOSIS — I10 Essential (primary) hypertension: Secondary | ICD-10-CM | POA: Diagnosis not present

## 2020-11-05 DIAGNOSIS — Z0000 Encounter for general adult medical examination without abnormal findings: Secondary | ICD-10-CM | POA: Diagnosis not present

## 2020-11-05 DIAGNOSIS — Z0001 Encounter for general adult medical examination with abnormal findings: Secondary | ICD-10-CM | POA: Diagnosis not present

## 2020-12-10 DIAGNOSIS — H2513 Age-related nuclear cataract, bilateral: Secondary | ICD-10-CM | POA: Diagnosis not present

## 2020-12-10 DIAGNOSIS — H524 Presbyopia: Secondary | ICD-10-CM | POA: Diagnosis not present

## 2020-12-15 DIAGNOSIS — E7800 Pure hypercholesterolemia, unspecified: Secondary | ICD-10-CM | POA: Diagnosis not present

## 2020-12-15 DIAGNOSIS — K219 Gastro-esophageal reflux disease without esophagitis: Secondary | ICD-10-CM | POA: Diagnosis not present

## 2020-12-15 DIAGNOSIS — I2510 Atherosclerotic heart disease of native coronary artery without angina pectoris: Secondary | ICD-10-CM | POA: Diagnosis not present

## 2020-12-15 DIAGNOSIS — I10 Essential (primary) hypertension: Secondary | ICD-10-CM | POA: Diagnosis not present

## 2020-12-17 DIAGNOSIS — Z1231 Encounter for screening mammogram for malignant neoplasm of breast: Secondary | ICD-10-CM | POA: Diagnosis not present

## 2021-02-14 DIAGNOSIS — I2510 Atherosclerotic heart disease of native coronary artery without angina pectoris: Secondary | ICD-10-CM | POA: Diagnosis not present

## 2021-02-14 DIAGNOSIS — K219 Gastro-esophageal reflux disease without esophagitis: Secondary | ICD-10-CM | POA: Diagnosis not present

## 2021-02-14 DIAGNOSIS — I10 Essential (primary) hypertension: Secondary | ICD-10-CM | POA: Diagnosis not present

## 2021-02-14 DIAGNOSIS — E7800 Pure hypercholesterolemia, unspecified: Secondary | ICD-10-CM | POA: Diagnosis not present

## 2021-03-14 ENCOUNTER — Other Ambulatory Visit: Payer: Self-pay

## 2021-03-14 ENCOUNTER — Encounter: Payer: Self-pay | Admitting: Cardiovascular Disease

## 2021-03-14 ENCOUNTER — Ambulatory Visit: Payer: Medicare Other | Admitting: Cardiovascular Disease

## 2021-03-14 VITALS — BP 130/62 | HR 66 | Ht 68.0 in | Wt 224.0 lb

## 2021-03-14 DIAGNOSIS — I2510 Atherosclerotic heart disease of native coronary artery without angina pectoris: Secondary | ICD-10-CM | POA: Diagnosis not present

## 2021-03-14 DIAGNOSIS — I10 Essential (primary) hypertension: Secondary | ICD-10-CM

## 2021-03-14 DIAGNOSIS — E7800 Pure hypercholesterolemia, unspecified: Secondary | ICD-10-CM | POA: Diagnosis not present

## 2021-03-14 NOTE — Patient Instructions (Signed)

## 2021-03-14 NOTE — Progress Notes (Signed)
Cardiology Office Note    Date:  03/17/2021   ID:  [name], DOB [birth year], MRN [id number]  PCP:  Madison Brunette, MD  Cardiologist:   Thurmon Fair, MD   Chief Complaint  Patient presents with  . Coronary Artery Disease    History of Present Illness:  Madison Mosley is a 70 y.o. female with early onset coronary artery disease (3 separate stent procedures 2001-2002) without need for revascularization in the last ost 20 years. Her last functional study was a normal nuclear stress test in February 2019.   She continues to feel well.  She denies any cardiovascular problems.  She walks a mile in 20 minutes 3 days a week.  This is less than last year, and when she was walking 4-6 days a week.  The patient specifically denies any chest pain at rest exertion, dyspnea at rest or with exertion, orthopnea, paroxysmal nocturnal dyspnea, syncope, palpitations, focal neurological deficits, intermittent claudication, lower extremity edema, unexplained weight gain, cough, hemoptysis or wheezing.  She has significant hyperlipidemia but unfortunately has been intolerant to most statins, Zetia and WelChol.  She is currently on pravastatin 20 mg daily and was unable to tolerate higher doses due to allergy.  She was enrolled in a clinical trial of an oral cholesterol-lowering drug but developed a rash.  She has never taken PCSK9 inh.  Her most recent lipid profile from December 2021 shows a total cholesterol 207, HDL 49, triglycerides 189, calculated LDL 125.  Past Medical History:  Diagnosis Date  . CAD (coronary artery disease)    stents  . Hyperlipidemia     Past Surgical History:  Procedure Laterality Date  . CARDIAC CATHETERIZATION  2001   Percutaneous revascularization precedure with angioplasty to the proximal LAD and first diagonal   . CESAREAN SECTION    . CHOLECYSTECTOMY    . PARTIAL HYSTERECTOMY    . TONSILLECTOMY  1960    Current Medications: Outpatient Medications Prior  to Visit  Medication Sig Dispense Refill  . aspirin EC 81 MG tablet Take 1 tablet (81 mg total) by mouth daily. 90 tablet 3  . metoprolol succinate (TOPROL-XL) 50 MG 24 hr tablet Take 25 mg by mouth daily.    . Multiple Vitamin (MULTIVITAMIN WITH MINERALS) TABS Take 1 tablet by mouth daily.    . Potassium (POTASSIMIN PO) Take 1 tablet by mouth daily.    . pravastatin (PRAVACHOL) 20 MG tablet Take 1 tablet by mouth daily.    Marland Kitchen triamcinolone cream (KENALOG) 0.1 % as needed.    . triamterene-hydrochlorothiazide (MAXZIDE-25) 37.5-25 MG per tablet Take 0.5 tablets by mouth daily.     . benzonatate (TESSALON) 100 MG capsule Take 1 capsule (100 mg total) by mouth every 8 (eight) hours. 21 capsule 0  . predniSONE (DELTASONE) 50 MG tablet Take 1 tablet (50 mg total) by mouth daily with breakfast. 5 tablet 0  . albuterol (VENTOLIN HFA) 108 (90 Base) MCG/ACT inhaler Inhale 2 puffs into the lungs every 6 (six) hours as needed for wheezing or shortness of breath. 8 g 2  . azithromycin (ZITHROMAX) 250 MG tablet Take 1 tablet (250 mg total) by mouth daily. Take first 2 tablets together, then 1 every day until finished. 6 tablet 0   No facility-administered medications prior to visit.     Allergies:   Other, Crestor  [rosuvastatin calcium], Ezetimibe, Pitavastatin, Welchol  [colesevelam hcl], Zocor  [simvastatin], and Naproxen   Social History   Socioeconomic History  .  Marital status: Married    Spouse name: Not on file  . Number of children: Not on file  . Years of education: Not on file  . Highest education level: Not on file  Occupational History  . Not on file  Tobacco Use  . Smoking status: Never Smoker  . Smokeless tobacco: Never Used  Substance and Sexual Activity  . Alcohol use: No    Alcohol/week: 0.0 standard drinks  . Drug use: No  . Sexual activity: Not on file  Other Topics Concern  . Not on file  Social History Narrative  . Not on file   Social Determinants of Health    Financial Resource Strain: Not on file  Food Insecurity: Not on file  Transportation Needs: Not on file  Physical Activity: Not on file  Stress: Not on file  Social Connections: Not on file     Family History:  The patient's family history includes CAD (age of onset: 38) in her brother; COPD in her mother; Diabetes in her mother; Heart attack in her maternal grandfather and paternal grandfather; Heart attack (age of onset: 80) in her sister; Heart attack (age of onset: 79) in her father; Heart failure in her mother; Stroke in her paternal grandfather; Valvular heart disease in her mother.   ROS:   Please see the history of present illness.    ROS All other systems are reviewed and are negative.   PHYSICAL EXAM:   VS:  BP 130/62   Pulse 66   Ht 5\' 8"  (1.727 m)   Wt 224 lb (101.6 kg)   SpO2 98%   BMI 34.06 kg/m      General: Alert, oriented x3, no distress, moderately obese Head: no evidence of trauma, PERRL, EOMI, no exophtalmos or lid lag, no myxedema, no xanthelasma; normal ears, nose and oropharynx Neck: normal jugular venous pulsations and no hepatojugular reflux; brisk carotid pulses without delay and no carotid bruits Chest: clear to auscultation, no signs of consolidation by percussion or palpation, normal fremitus, symmetrical and full respiratory excursions Cardiovascular: normal position and quality of the apical impulse, regular rhythm, normal first and second heart sounds, no murmurs, rubs or gallops Abdomen: no tenderness or distention, no masses by palpation, no abnormal pulsatility or arterial bruits, normal bowel sounds, no hepatosplenomegaly Extremities: no clubbing, cyanosis or edema; 2+ radial, ulnar and brachial pulses bilaterally; 2+ right femoral, posterior tibial and dorsalis pedis pulses; 2+ left femoral, posterior tibial and dorsalis pedis pulses; no subclavian or femoral bruits Neurological: grossly nonfocal Psych: Normal mood and affect   Wt Readings  from Last 3 Encounters:  03/14/21 224 lb (101.6 kg)  10/02/20 225 lb (102.1 kg)  04/25/20 225 lb (102.1 kg)      Studies/Labs Reviewed:   EKG:  EKG is ordered today.  It shows normal sinus rhythm with with a minor nonspecific ST-T changes, not changed from previous tracings.  Recent Labs:  Lipid Panel  total cholesterol 211, LDL 98, triglycerides 153, HDL 55 (10/21/2018).   10/27/2019 Total cholesterol 230, HDL 61, triglycerides 162 Creatinine 0.74, normal liver function tests  10/29/2020 Total cholesterol 207, HDL 49, LDL 125, triglycerides 189 Creatinine 0.83, normal liver function tests  ASSESSMENT:    1. Essential hypertension      PLAN:  In order of problems listed above:  1. CAD: She remains asymptomatic despite being fairly active.  It's been 20 years since her revascularization procedures.  She is physically active.  On aspirin, maximum tolerated dose of  statin and beta-blockers.  Does not want to increase her medications. 2. HTN: Very well controlled. 3. HLP: Once again declines PCSK9 inhibitors or any of the other newer cholesterol-lowering medications.  She has declined referral to the lipid clinic.  HDL was better last year, may be because she was walking more frequently.  Asked her to increase her overall exercise to at least 2-1/2 hours of walking every week and an extra 30 minutes of a little more intense activity such as walking with dumbbells, aerobics, etc.  Medication Adjustments/Labs and Tests Ordered: Current medicines are reviewed at length with the patient today.  Concerns regarding medicines are outlined above.  Medication changes, Labs and Tests ordered today are listed in the Patient Instructions below. Patient Instructions  Medication Instructions:  No changes *If you need a refill on your cardiac medications before your next appointment, please call your pharmacy*   Lab Work: None ordered If you have labs (blood work) drawn today and your  tests are completely normal, you will receive your results only by: Marland Kitchen MyChart Message (if you have MyChart) OR . A paper copy in the mail If you have any lab test that is abnormal or we need to change your treatment, we will call you to review the results.   Testing/Procedures: None ordered   Follow-Up: At [facility], you and your health needs are our priority.  As part of our continuing mission to provide you with exceptional heart care, we have created designated Provider Care Teams.  These Care Teams include your primary Cardiologist (physician) and Advanced Practice Providers (APPs -  Physician Assistants and Nurse Practitioners) who all work together to provide you with the care you need, when you need it.  We recommend signing up for the patient portal called "MyChart".  Sign up information is provided on this After Visit Summary.  MyChart is used to connect with patients for Virtual Visits (Telemedicine).  Patients are able to view lab/test results, encounter notes, upcoming appointments, etc.  Non-urgent messages can be sent to your provider as well.   To learn more about what you can do with MyChart, go to [email or web address].    Your next appointment:   12 month(s)  The format for your next appointment:   In Person  Provider:   You may see Thurmon Fair, MD or one of the following Advanced Practice Providers on your designated Care Team:    Azalee Course, PA-C  Micah Flesher, [place] or   Judy Pimple, PA-C       Signed, Thurmon Fair, MD  03/17/2021 6:05 PM    [facility] [address] [place], [place], [place]  [address] Phone: [phone]; Fax: [phone]

## 2021-03-17 ENCOUNTER — Encounter: Payer: Self-pay | Admitting: Cardiovascular Disease

## 2021-04-16 DIAGNOSIS — I10 Essential (primary) hypertension: Secondary | ICD-10-CM | POA: Diagnosis not present

## 2021-04-16 DIAGNOSIS — K219 Gastro-esophageal reflux disease without esophagitis: Secondary | ICD-10-CM | POA: Diagnosis not present

## 2021-04-16 DIAGNOSIS — I2510 Atherosclerotic heart disease of native coronary artery without angina pectoris: Secondary | ICD-10-CM | POA: Diagnosis not present

## 2021-04-16 DIAGNOSIS — E7800 Pure hypercholesterolemia, unspecified: Secondary | ICD-10-CM | POA: Diagnosis not present

## 2021-05-16 DIAGNOSIS — K219 Gastro-esophageal reflux disease without esophagitis: Secondary | ICD-10-CM | POA: Diagnosis not present

## 2021-05-16 DIAGNOSIS — E7800 Pure hypercholesterolemia, unspecified: Secondary | ICD-10-CM | POA: Diagnosis not present

## 2021-05-16 DIAGNOSIS — I10 Essential (primary) hypertension: Secondary | ICD-10-CM | POA: Diagnosis not present

## 2021-05-16 DIAGNOSIS — I2510 Atherosclerotic heart disease of native coronary artery without angina pectoris: Secondary | ICD-10-CM | POA: Diagnosis not present

## 2021-06-16 DIAGNOSIS — K219 Gastro-esophageal reflux disease without esophagitis: Secondary | ICD-10-CM | POA: Diagnosis not present

## 2021-06-16 DIAGNOSIS — E7800 Pure hypercholesterolemia, unspecified: Secondary | ICD-10-CM | POA: Diagnosis not present

## 2021-06-16 DIAGNOSIS — I10 Essential (primary) hypertension: Secondary | ICD-10-CM | POA: Diagnosis not present

## 2021-06-16 DIAGNOSIS — I2510 Atherosclerotic heart disease of native coronary artery without angina pectoris: Secondary | ICD-10-CM | POA: Diagnosis not present

## 2021-08-16 DIAGNOSIS — I10 Essential (primary) hypertension: Secondary | ICD-10-CM | POA: Diagnosis not present

## 2021-08-16 DIAGNOSIS — I2510 Atherosclerotic heart disease of native coronary artery without angina pectoris: Secondary | ICD-10-CM | POA: Diagnosis not present

## 2021-08-16 DIAGNOSIS — K219 Gastro-esophageal reflux disease without esophagitis: Secondary | ICD-10-CM | POA: Diagnosis not present

## 2021-08-16 DIAGNOSIS — E7800 Pure hypercholesterolemia, unspecified: Secondary | ICD-10-CM | POA: Diagnosis not present

## 2021-09-23 DIAGNOSIS — Z23 Encounter for immunization: Secondary | ICD-10-CM | POA: Diagnosis not present

## 2021-11-12 DIAGNOSIS — I10 Essential (primary) hypertension: Secondary | ICD-10-CM | POA: Diagnosis not present

## 2021-11-12 DIAGNOSIS — R7303 Prediabetes: Secondary | ICD-10-CM | POA: Diagnosis not present

## 2021-11-14 DIAGNOSIS — R7303 Prediabetes: Secondary | ICD-10-CM | POA: Diagnosis not present

## 2021-11-14 DIAGNOSIS — K219 Gastro-esophageal reflux disease without esophagitis: Secondary | ICD-10-CM | POA: Diagnosis not present

## 2021-11-14 DIAGNOSIS — Z9861 Coronary angioplasty status: Secondary | ICD-10-CM | POA: Diagnosis not present

## 2021-11-14 DIAGNOSIS — I2510 Atherosclerotic heart disease of native coronary artery without angina pectoris: Secondary | ICD-10-CM | POA: Diagnosis not present

## 2021-11-14 DIAGNOSIS — Z0000 Encounter for general adult medical examination without abnormal findings: Secondary | ICD-10-CM | POA: Diagnosis not present

## 2021-11-14 DIAGNOSIS — I10 Essential (primary) hypertension: Secondary | ICD-10-CM | POA: Diagnosis not present

## 2021-12-13 DIAGNOSIS — H2513 Age-related nuclear cataract, bilateral: Secondary | ICD-10-CM | POA: Diagnosis not present

## 2021-12-13 DIAGNOSIS — H353131 Nonexudative age-related macular degeneration, bilateral, early dry stage: Secondary | ICD-10-CM | POA: Diagnosis not present

## 2021-12-13 DIAGNOSIS — H524 Presbyopia: Secondary | ICD-10-CM | POA: Diagnosis not present

## 2021-12-18 DIAGNOSIS — Z1231 Encounter for screening mammogram for malignant neoplasm of breast: Secondary | ICD-10-CM | POA: Diagnosis not present

## 2022-02-26 IMAGING — MR MR LUMBAR SPINE W/O CM
4 of 8 series · 17 of 48 positions shown · non-contrast
Comparison: None.

CLINICAL DATA: Low back pain with right leg pain and numbness

EXAM:
MRI LUMBAR SPINE WITHOUT CONTRAST
TECHNIQUE: Multiplanar, multisequence MR imaging of the lumbar spine was
performed. No intravenous contrast was administered.

[Series 9: T2 · sagittal · 4.0mm · 0.73mm/px · 3 of 15 slices shown (1 of 3)]
[im 1/15]
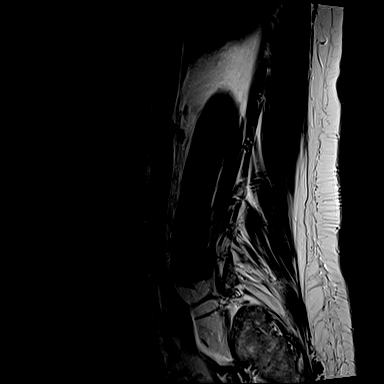
[im 8/15]
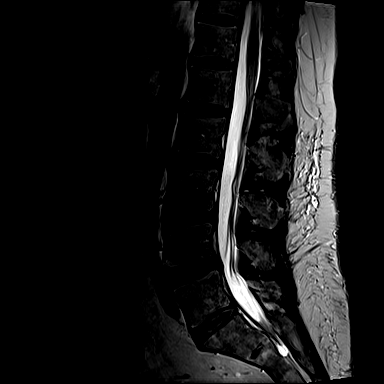
[im 15/15]
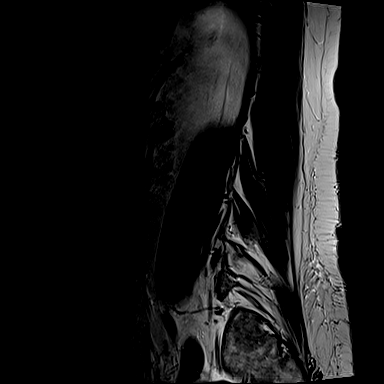

[Series 10: T1 · sagittal · 4.0mm · 0.73mm/px · 3 of 15 slices shown]
[im 1/15]
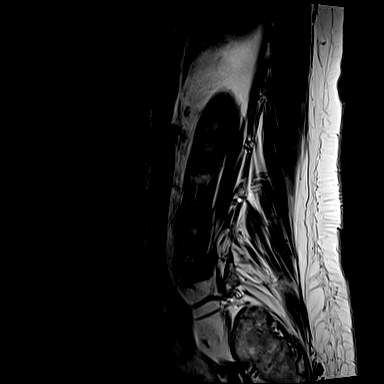
[im 8/15]
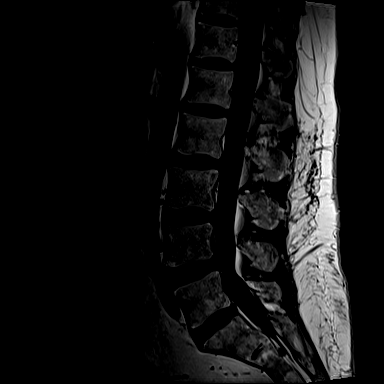
[im 15/15]
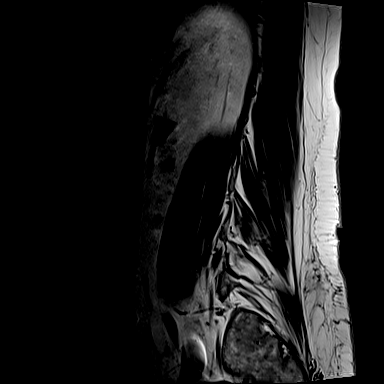

[Series 15: T2 · axial · 4.0mm · 0.28mm/px · z∈[-7,+83]mm · 5 of 19 slices shown (2 of 3)]
[im 1/19]
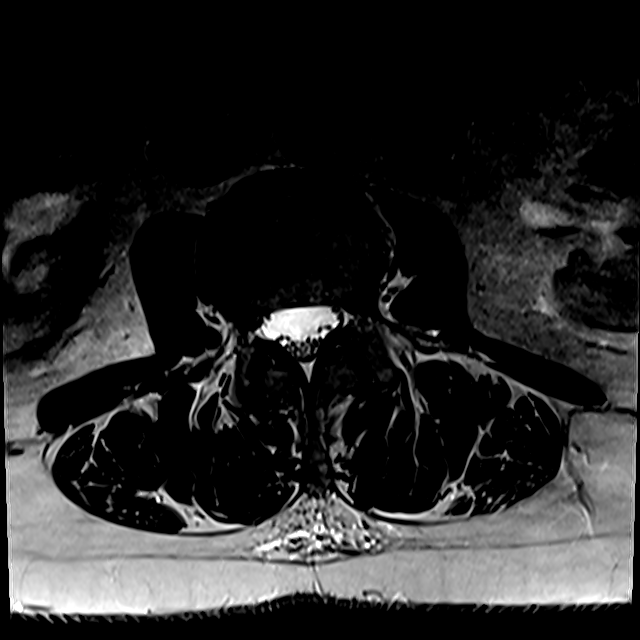
[im 5/19]
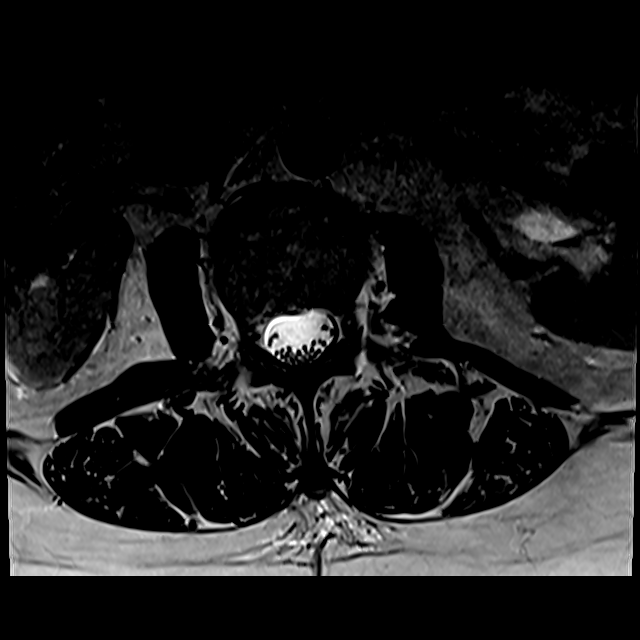
[im 10/19]
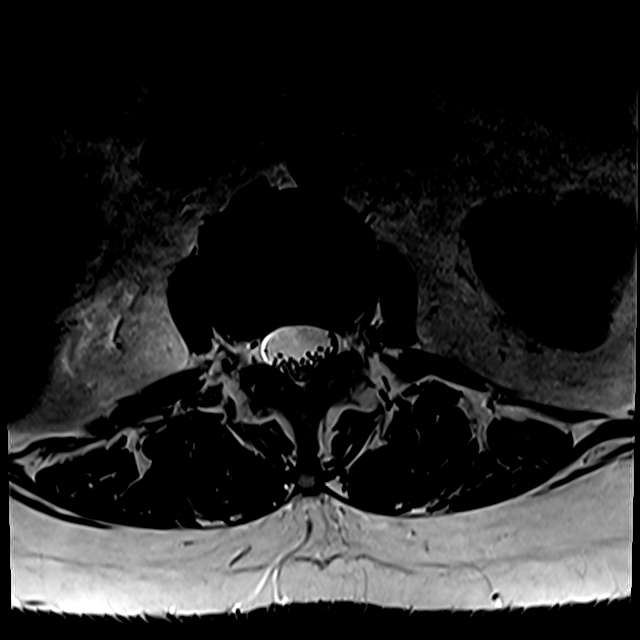
[im 14/19]
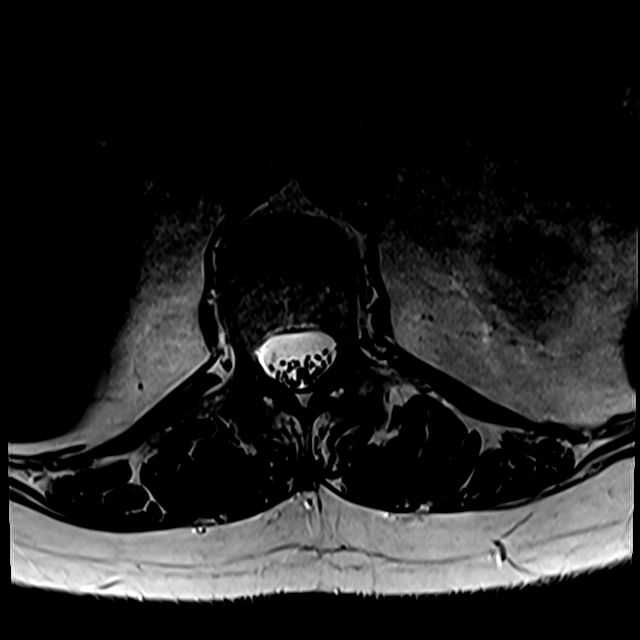
[im 19/19]
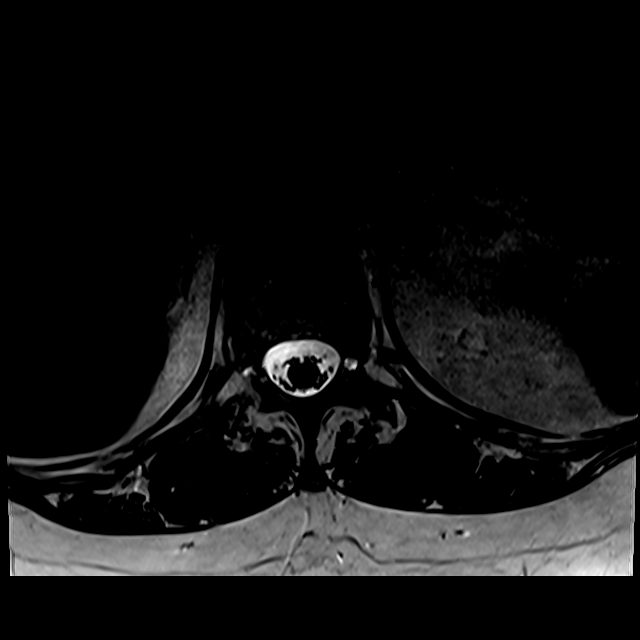

[Series 17: T2 · axial · 4.0mm · 0.28mm/px · z∈[-147,+58]mm · 6 of 43 slices shown (3 of 3)]
[im 1/43]
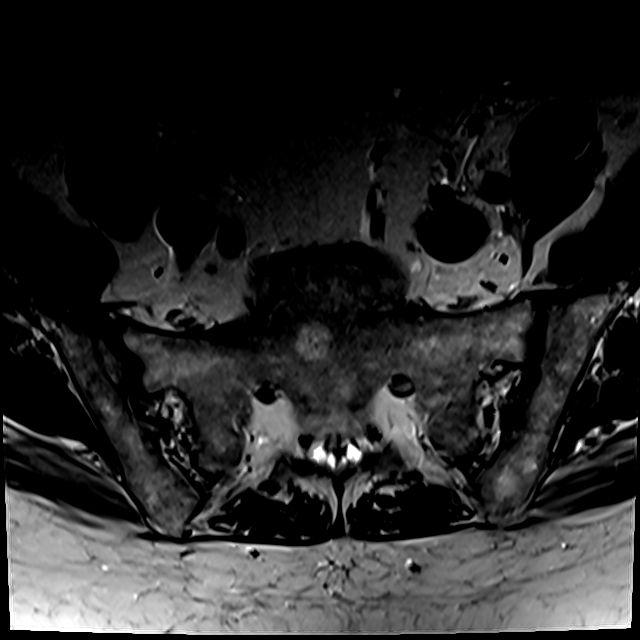
[im 5/43]
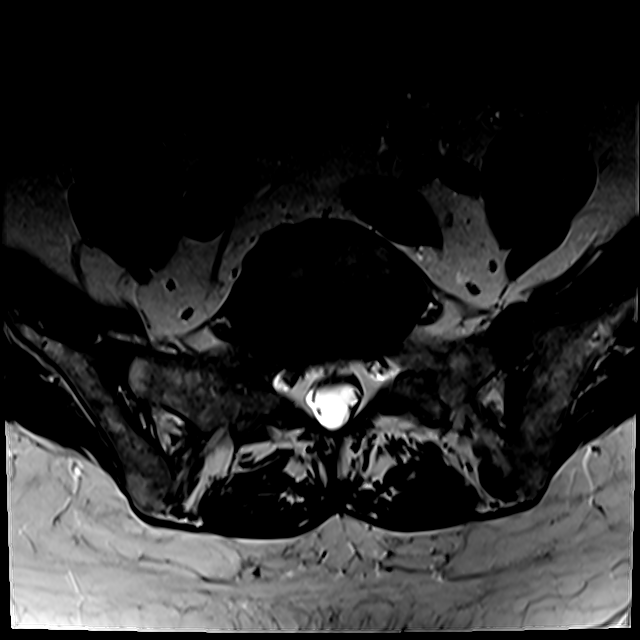
[im 9/43]
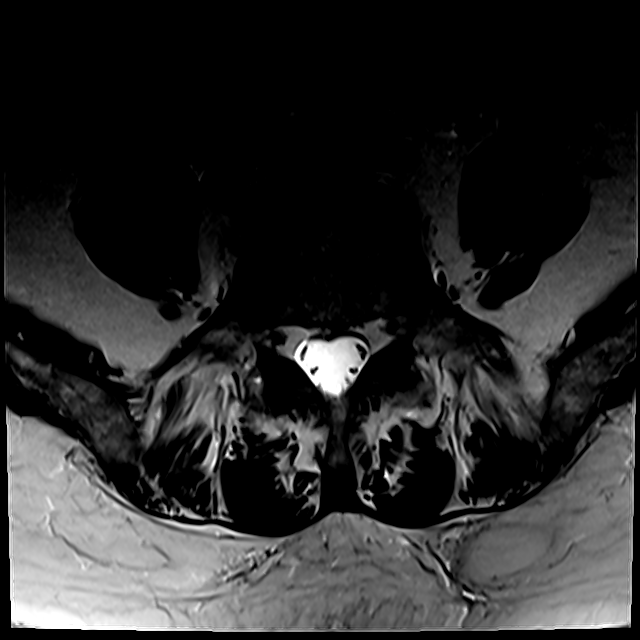
[im 13/43]
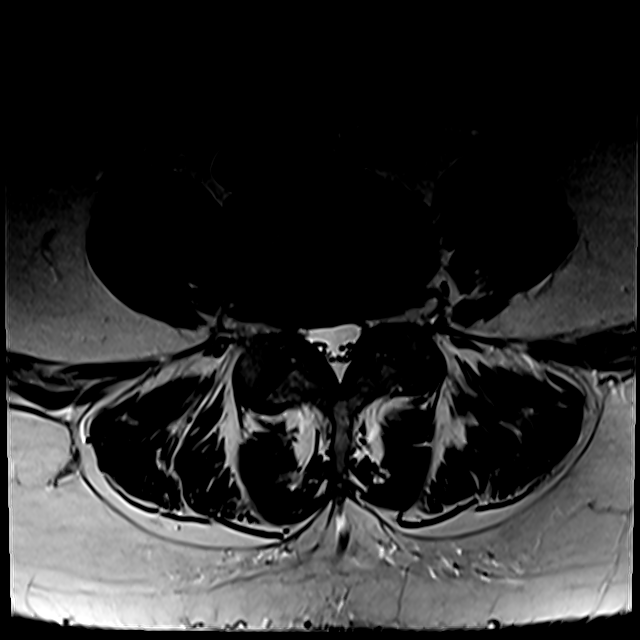
[im 22/43]
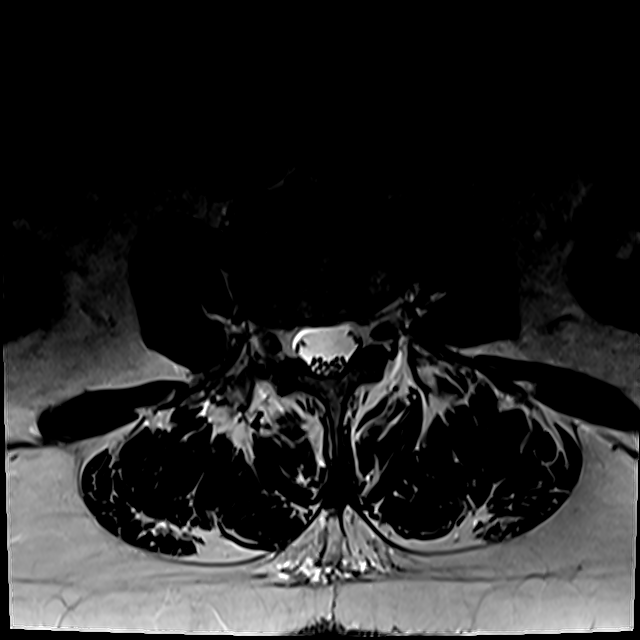
[im 38/43]
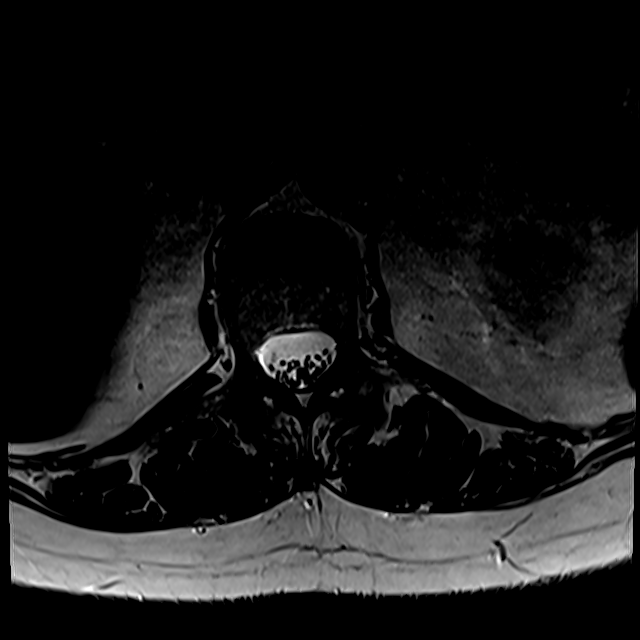

[17 of 48 positions shown; findings below may reference images not displayed]

FINDINGS: Segmentation:  Standard.

Alignment:  Physiologic.

Vertebrae:  No fracture, evidence of discitis, or bone lesion.

Conus medullaris and cauda equina: Conus extends to the L1 level.
Conus and cauda equina appear normal.

Paraspinal and other soft tissues: Tiny left upper pole cystic
intensity.

Disc levels:

Mild for age degenerative changes, mainly disc bulging and facet
spurring at L3-4 and L4-5. The spinal canal and foramina are
diffusely patent. There is mild narrowing of the right subarticular
recess at L4-5.
IMPRESSION: 1. Mild for age degenerative changes mainly seen at L3-4 and L4-5.
2. Mild right subarticular recess narrowing at L4-5. No compressive
canal or foraminal narrowings.

## 2022-05-07 NOTE — Progress Notes (Signed)
Office Visit    Patient Name: Madison Mosley Date of Encounter: 05/08/2022  Primary Care Provider:  Merri Brunette, MD Primary Cardiologist:  Thurmon Fair, MD  Chief Complaint    71 year old female with a history of early onset CAD (3 separate stent procedures from 2001 2002), hypertension, and hyperlipidemia who presents for follow-up related to CAD.  Past Medical History    Past Medical History:  Diagnosis Date   CAD (coronary artery disease)    stents   Hyperlipidemia    Past Surgical History:  Procedure Laterality Date   CARDIAC CATHETERIZATION  2001   Percutaneous revascularization precedure with angioplasty to the proximal LAD and first diagonal    CESAREAN SECTION     CHOLECYSTECTOMY     PARTIAL HYSTERECTOMY     TONSILLECTOMY  1960    Allergies  Allergies  Allergen Reactions   Other Other (See Comments)    Pork / Headaches   Crestor  [Rosuvastatin Calcium]     Other reaction(s): Arthralgia (Joint Pain)   Ezetimibe    Pitavastatin    Welchol  [Colesevelam Hcl]    Zocor  [Simvastatin]    Naproxen Rash    Palm of Hand    History of Present Illness    71 year old female with the above past medical history including early onset CAD (3 separate stent procedures from 2001-2002), hypertension, and hyperlipidemia.  She has a history of CAD with remote stenting x3 from 2001-2002. She has not required revascularization since. Stress test in 2019 was normal.  She does have a history of significant hyperlipidemia, intolerant to statins.  She has tolerated pravastatin 20 mg daily.  She has never taken PCSK9 inhibitor-she has declined this in the past.  She was last seen in the office on 03/14/2021 and was stable from a cardiac standpoint. She denied symptoms concerning for angina. She reported walking 1 mile most days of the week.  She was asked to increase her exercise in an effort to further lower her cholesterol as she has repeatedly declined PCSK9  inhibitor/lipid clinic referral.  She presents today for follow-up.  Since her last visit she has done well from a cardiac standpoint.  She is walking 1 mile every day.  She reports a 40 pound weight loss over the last year and a half.  She denies any symptoms concerning for angina.  She is preparing to go on a mission trip to [place]. Overall, she reports feeling well and denies any new concerns today.   Home Medications    Current Outpatient Medications  Medication Sig Dispense Refill   aspirin EC 81 MG tablet Take 1 tablet (81 mg total) by mouth daily. 90 tablet 3   metoprolol succinate (TOPROL-XL) 50 MG 24 hr tablet Take 25 mg by mouth daily.     Multiple Vitamin (MULTIVITAMIN WITH MINERALS) TABS Take 1 tablet by mouth daily.     Potassium (POTASSIMIN PO) Take 1 tablet by mouth daily.     pravastatin (PRAVACHOL) 20 MG tablet Take 1 tablet by mouth daily.     triamcinolone cream (KENALOG) 0.1 % as needed.     triamterene-hydrochlorothiazide (MAXZIDE-25) 37.5-25 MG per tablet Take 0.5 tablets by mouth daily.      No current facility-administered medications for this visit.     Review of Systems   She denies chest pain, palpitations, dyspnea, pnd, orthopnea, n, v, dizziness, syncope, edema, weight gain, or early satiety. All other systems reviewed and are otherwise negative except as noted  above.   Physical Exam    VS:  BP 138/60   Pulse (!) 56   Ht 5\' 8"  (1.727 m)   Wt 198 lb 6.4 oz (90 kg)   SpO2 99%   BMI 30.17 kg/m  GEN: Well nourished, well developed, in no acute distress. HEENT: normal. Neck: Supple, no JVD, carotid bruits, or masses. Cardiac: RRR, no murmurs, rubs, or gallops. No clubbing, cyanosis, edema.  Radials/DP/PT 2+ and equal bilaterally.  Respiratory:  Respirations regular and unlabored, clear to auscultation bilaterally. GI: Soft, nontender, nondistended, BS + x 4. MS: no deformity or atrophy. Skin: warm and dry, no rash. Neuro:  Strength and sensation are  intact. Psych: Normal affect.  Accessory Clinical Findings    ECG personally reviewed by me today -sinus bradycardia, 56 bpm, incomplete RBBB, nonspecific ST/T wave changes- no acute changes.  No results found for: "WBC", "HGB", "HCT", "MCV", "PLT" No results found for: "CREATININE", "BUN", "NA", "K", "CL", "CO2" No results found for: "ALT", "AST", "GGT", "ALKPHOS", "BILITOT" No results found for: "CHOL", "HDL", "LDLCALC", "LDLDIRECT", "TRIG", "CHOLHDL"  No results found for: "HGBA1C"  Assessment & Plan    1. CAD: S/p stenting x3 from 2001-2002. Stable with no anginal symptoms. No indication for ischemic evaluation.  Continue aspirin, metoprolol, Maxide, and pravastatin.   2. Hypertension: BP well controlled. Continue current antihypertensive regimen.   3. Hyperlipidemia: No recent LDL on file. Will request most recent lipid profile from PCP (she states she had labs done in 10/2021 and cholesterol levels were improved from prior).  She continues to decline lipid clinic referral/PCSK9 inhibitor.  Continue pravastatin.  4. Disposition: Follow-up in 1 year with Dr. 11/2021.    Royann Shivers, NP 05/08/2022, 8:50 AM

## 2022-05-08 ENCOUNTER — Encounter: Payer: Self-pay | Admitting: Nurse Practitioner

## 2022-05-08 ENCOUNTER — Ambulatory Visit: Payer: Medicare Other | Admitting: Nurse Practitioner

## 2022-05-08 VITALS — BP 138/60 | HR 56 | Ht 68.0 in | Wt 198.4 lb

## 2022-05-08 DIAGNOSIS — I2510 Atherosclerotic heart disease of native coronary artery without angina pectoris: Secondary | ICD-10-CM

## 2022-05-08 DIAGNOSIS — E785 Hyperlipidemia, unspecified: Secondary | ICD-10-CM | POA: Diagnosis not present

## 2022-05-08 DIAGNOSIS — I10 Essential (primary) hypertension: Secondary | ICD-10-CM

## 2022-05-08 NOTE — Patient Instructions (Signed)
Medication Instructions:  Your physician recommends that you continue on your current medications as directed. Please refer to the Current Medication list given to you today.   *If you need a refill on your cardiac medications before your next appointment, please call your pharmacy*   Lab Work: NONE ordered at this time of appointment   If you have labs (blood work) drawn today and your tests are completely normal, you will receive your results only by: MyChart Message (if you have MyChart) OR A paper copy in the mail If you have any lab test that is abnormal or we need to change your treatment, we will call you to review the results.   Testing/Procedures: NONE ordered at this time of appointment     Follow-Up: At [facility], you and your health needs are our priority.  As part of our continuing mission to provide you with exceptional heart care, we have created designated Provider Care Teams.  These Care Teams include your primary Cardiologist (physician) and Advanced Practice Providers (APPs -  Physician Assistants and Nurse Practitioners) who all work together to provide you with the care you need, when you need it.  We recommend signing up for the patient portal called "MyChart".  Sign up information is provided on this After Visit Summary.  MyChart is used to connect with patients for Virtual Visits (Telemedicine).  Patients are able to view lab/test results, encounter notes, upcoming appointments, etc.  Non-urgent messages can be sent to your provider as well.   To learn more about what you can do with MyChart, go to [email or web address].    Your next appointment:   1 year(s)  The format for your next appointment:   In Person  Provider:   Mihai Croitoru, MD     Other Instructions   Important Information About Sugar       

## 2022-10-21 DIAGNOSIS — G720 Drug-induced myopathy: Secondary | ICD-10-CM | POA: Diagnosis not present

## 2022-10-21 DIAGNOSIS — T466X5A Adverse effect of antihyperlipidemic and antiarteriosclerotic drugs, initial encounter: Secondary | ICD-10-CM | POA: Diagnosis not present

## 2022-11-21 DIAGNOSIS — E781 Pure hyperglyceridemia: Secondary | ICD-10-CM | POA: Diagnosis not present

## 2022-11-21 DIAGNOSIS — I10 Essential (primary) hypertension: Secondary | ICD-10-CM | POA: Diagnosis not present

## 2022-11-21 DIAGNOSIS — R7303 Prediabetes: Secondary | ICD-10-CM | POA: Diagnosis not present

## 2022-11-25 DIAGNOSIS — M791 Myalgia, unspecified site: Secondary | ICD-10-CM | POA: Diagnosis not present

## 2022-11-25 DIAGNOSIS — T466X5A Adverse effect of antihyperlipidemic and antiarteriosclerotic drugs, initial encounter: Secondary | ICD-10-CM | POA: Diagnosis not present

## 2022-11-25 DIAGNOSIS — I2510 Atherosclerotic heart disease of native coronary artery without angina pectoris: Secondary | ICD-10-CM | POA: Diagnosis not present

## 2022-11-25 DIAGNOSIS — E781 Pure hyperglyceridemia: Secondary | ICD-10-CM | POA: Diagnosis not present

## 2022-11-28 DIAGNOSIS — E781 Pure hyperglyceridemia: Secondary | ICD-10-CM | POA: Diagnosis not present

## 2022-12-19 DIAGNOSIS — H2513 Age-related nuclear cataract, bilateral: Secondary | ICD-10-CM | POA: Diagnosis not present

## 2022-12-19 DIAGNOSIS — H524 Presbyopia: Secondary | ICD-10-CM | POA: Diagnosis not present

## 2023-01-26 DIAGNOSIS — I2510 Atherosclerotic heart disease of native coronary artery without angina pectoris: Secondary | ICD-10-CM | POA: Diagnosis not present

## 2023-01-28 DIAGNOSIS — I2510 Atherosclerotic heart disease of native coronary artery without angina pectoris: Secondary | ICD-10-CM | POA: Diagnosis not present

## 2023-01-28 DIAGNOSIS — E782 Mixed hyperlipidemia: Secondary | ICD-10-CM | POA: Diagnosis not present

## 2023-01-28 DIAGNOSIS — M791 Myalgia, unspecified site: Secondary | ICD-10-CM | POA: Diagnosis not present

## 2023-01-28 DIAGNOSIS — T466X5A Adverse effect of antihyperlipidemic and antiarteriosclerotic drugs, initial encounter: Secondary | ICD-10-CM | POA: Diagnosis not present

## 2023-03-23 DIAGNOSIS — Z9861 Coronary angioplasty status: Secondary | ICD-10-CM | POA: Diagnosis not present

## 2023-03-23 DIAGNOSIS — I10 Essential (primary) hypertension: Secondary | ICD-10-CM | POA: Diagnosis not present

## 2023-03-23 DIAGNOSIS — E781 Pure hyperglyceridemia: Secondary | ICD-10-CM | POA: Diagnosis not present

## 2023-03-26 DIAGNOSIS — Z0000 Encounter for general adult medical examination without abnormal findings: Secondary | ICD-10-CM | POA: Diagnosis not present

## 2023-03-26 DIAGNOSIS — G720 Drug-induced myopathy: Secondary | ICD-10-CM | POA: Diagnosis not present

## 2023-03-26 DIAGNOSIS — I10 Essential (primary) hypertension: Secondary | ICD-10-CM | POA: Diagnosis not present

## 2023-03-26 DIAGNOSIS — E871 Hypo-osmolality and hyponatremia: Secondary | ICD-10-CM | POA: Diagnosis not present

## 2023-03-26 DIAGNOSIS — E039 Hypothyroidism, unspecified: Secondary | ICD-10-CM | POA: Diagnosis not present

## 2023-03-26 DIAGNOSIS — I2510 Atherosclerotic heart disease of native coronary artery without angina pectoris: Secondary | ICD-10-CM | POA: Diagnosis not present

## 2023-03-26 DIAGNOSIS — I8390 Asymptomatic varicose veins of unspecified lower extremity: Secondary | ICD-10-CM | POA: Diagnosis not present

## 2023-03-26 DIAGNOSIS — Z5320 Procedure and treatment not carried out because of patient's decision for unspecified reasons: Secondary | ICD-10-CM | POA: Diagnosis not present

## 2023-03-26 DIAGNOSIS — K219 Gastro-esophageal reflux disease without esophagitis: Secondary | ICD-10-CM | POA: Diagnosis not present

## 2023-03-26 DIAGNOSIS — R7303 Prediabetes: Secondary | ICD-10-CM | POA: Diagnosis not present

## 2023-04-14 DIAGNOSIS — E039 Hypothyroidism, unspecified: Secondary | ICD-10-CM | POA: Diagnosis not present

## 2023-10-19 DIAGNOSIS — E039 Hypothyroidism, unspecified: Secondary | ICD-10-CM | POA: Diagnosis not present

## 2023-10-29 DIAGNOSIS — E039 Hypothyroidism, unspecified: Secondary | ICD-10-CM | POA: Diagnosis not present

## 2023-11-10 DIAGNOSIS — S92354A Nondisplaced fracture of fifth metatarsal bone, right foot, initial encounter for closed fracture: Secondary | ICD-10-CM | POA: Diagnosis not present

## 2023-11-10 DIAGNOSIS — S99921A Unspecified injury of right foot, initial encounter: Secondary | ICD-10-CM | POA: Diagnosis not present

## 2023-11-10 DIAGNOSIS — M79671 Pain in right foot: Secondary | ICD-10-CM | POA: Diagnosis not present

## 2023-11-16 ENCOUNTER — Other Ambulatory Visit (INDEPENDENT_AMBULATORY_CARE_PROVIDER_SITE_OTHER): Payer: Medicare Other

## 2023-11-16 ENCOUNTER — Ambulatory Visit: Payer: Medicare Other | Admitting: Orthopedic Surgery

## 2023-11-16 DIAGNOSIS — M79671 Pain in right foot: Secondary | ICD-10-CM

## 2023-11-16 DIAGNOSIS — S92351A Displaced fracture of fifth metatarsal bone, right foot, initial encounter for closed fracture: Secondary | ICD-10-CM | POA: Diagnosis not present

## 2023-11-17 ENCOUNTER — Encounter: Payer: Self-pay | Admitting: Orthopedic Surgery

## 2023-12-14 ENCOUNTER — Ambulatory Visit: Payer: Medicare Other | Admitting: Orthopedic Surgery

## 2023-12-14 ENCOUNTER — Other Ambulatory Visit (INDEPENDENT_AMBULATORY_CARE_PROVIDER_SITE_OTHER): Payer: Medicare Other

## 2023-12-14 DIAGNOSIS — M79671 Pain in right foot: Secondary | ICD-10-CM | POA: Diagnosis not present

## 2023-12-14 DIAGNOSIS — S92351A Displaced fracture of fifth metatarsal bone, right foot, initial encounter for closed fracture: Secondary | ICD-10-CM

## 2023-12-22 ENCOUNTER — Encounter: Payer: Self-pay | Admitting: Orthopedic Surgery

## 2023-12-22 NOTE — Progress Notes (Signed)
Office Visit Note   Patient: Madison Mosley           Date of Birth: 08-30-1951           MRN: [id number] Visit Date: 12/14/2023              Requested by: Merri Brunette, MD [address] [place],  [place] [address] PCP: Merri Brunette, MD  Chief Complaint  Patient presents with   Right Foot - Follow-up    Base 5th MT fx       HPI: Patient is a 73 year old woman who presents in follow-up for his closed nondisplaced metaphyseal fracture base of the fifth metatarsal right foot.  Patient is wearing a postoperative shoe and states she feels better.  Assessment & Plan: Visit Diagnoses:  1. Pain in right foot   2. Closed fracture of base of fifth metatarsal bone of right foot, initial encounter     Plan: Advance to regular shoewear with a stiff soled sneaker.  Follow-Up Instructions: Return if symptoms worsen or fail to improve.   Ortho Exam  Patient is alert, oriented, no adenopathy, well-dressed, normal affect, normal respiratory effort. Examination the fracture site is not tender to palpation there is no redness no cellulitis no wounds.  Imaging: No results found. No images are attached to the encounter.  Labs: No results found for: "HGBA1C", "ESRSEDRATE", "CRP", "LABURIC", "REPTSTATUS", "GRAMSTAIN", "CULT", "LABORGA"   No results found for: "ALBUMIN", "PREALBUMIN", "CBC"  No results found for: "MG" No results found for: "VD25OH"  No results found for: "PREALBUMIN"     No data to display           There is no height or weight on file to calculate BMI.  Orders:  Orders Placed This Encounter  Procedures   XR Foot Complete Right   No orders of the defined types were placed in this encounter.    Procedures: No procedures performed  Clinical Data: No additional findings.  ROS:  All other systems negative, except as noted in the HPI. Review of Systems  Objective: Vital Signs: There were no vitals taken for this  visit.  Specialty Comments:  No specialty comments available.  PMFS History: Patient Active Problem List   Diagnosis Date Noted   Right knee meniscal tear 10/02/2020   Unilateral primary osteoarthritis, right knee 04/25/2020   Low back pain 03/21/2020   Statin myopathy 11/16/2019   Atrophic vaginitis 06/18/2017   Sprain of calcaneofibular ligament of left ankle 01/16/2017   Obesity (BMI 30.0-34.9) 01/05/2015   CAD S/P percutaneous coronary angioplasty 04/21/2013   Hypercholesterolemia 04/21/2013   Essential hypertension 04/21/2013   Past Medical History:  Diagnosis Date   CAD (coronary artery disease)    stents   Hyperlipidemia     Family History  Problem Relation Age of Onset   COPD Mother    Heart failure Mother    Diabetes Mother    Valvular heart disease Mother        mitral valve leakage   Heart attack Father 84       multiple heart attacks   Heart attack Sister 73   CAD Brother 48   Heart attack Maternal Grandfather    Heart attack Paternal Grandfather        multiple hearts attacks   Stroke Paternal Grandfather     Past Surgical History:  Procedure Laterality Date   CARDIAC CATHETERIZATION  2001   Percutaneous revascularization precedure with angioplasty to the proximal LAD  and first diagonal    CESAREAN SECTION     CHOLECYSTECTOMY     PARTIAL HYSTERECTOMY     TONSILLECTOMY  1960   Social History   Occupational History   Not on file  Tobacco Use   Smoking status: Never   Smokeless tobacco: Never  Substance and Sexual Activity   Alcohol use: No    Alcohol/week: 0.0 standard drinks of alcohol   Drug use: No   Sexual activity: Not on file

## 2024-03-12 DIAGNOSIS — M1712 Unilateral primary osteoarthritis, left knee: Secondary | ICD-10-CM | POA: Diagnosis not present

## 2024-03-23 ENCOUNTER — Encounter: Payer: Self-pay | Admitting: Family

## 2024-03-23 ENCOUNTER — Ambulatory Visit: Admitting: Family

## 2024-03-23 DIAGNOSIS — M25562 Pain in left knee: Secondary | ICD-10-CM | POA: Diagnosis not present

## 2024-03-23 MED ORDER — HYDROCODONE-ACETAMINOPHEN 5-325 MG PO TABS: 1.0000 | ORAL_TABLET | Freq: Four times a day (QID) | ORAL | 0 refills | Status: DC | PRN

## 2024-03-23 NOTE — Progress Notes (Signed)
 Office Visit Note   Patient: Madison Mosley           Date of Birth: [birth year]           MRN: [id number] Visit Date: 03/23/2024              Requested by: Imelda Man, MD [address] [place],  [place] [address] PCP: Imelda Man, MD  Chief Complaint  Patient presents with   Left Knee - Pain      HPI: The patient is a 73 year old woman who is seen today for evaluation of left knee pain which has been ongoing since Easter Sunday.  She cannot recall any specific injury she did have a fall onto her right side which resulted in a foot fracture many months ago she did not initially have knee pain she wonders if her knee pain has been brought on by favoring the foot  She complains of deep constant pain this occurs at rest she has difficulty sleeping on her right side due to pain she is complaining of anterior as well as lateral knee pain worse pain with ambulation  Did have a steroid injection in the knee on April 26 which did not offer any relief  She has been using ibuprofen twice daily as well as Tylenol  with modest relief of her pain  Assessment & Plan: Visit Diagnoses: No diagnosis found.  Plan: Concern for possible meniscal involvement will proceed with MRI as she has not had any relief from steroid injection.  Follow-Up Instructions: No follow-ups on file.   Left Knee Exam   Tenderness  The patient is experiencing tenderness in the medial joint line and lateral joint line.  Range of Motion  The patient has normal left knee ROM.  Tests  Varus: negative Valgus: negative  Other  Erythema: absent Swelling: none Effusion: no effusion present      Patient is alert, oriented, no adenopathy, well-dressed, normal affect, normal respiratory effort.   Imaging: No results found. No images are attached to the encounter.  Labs: No results found for: "HGBA1C", "ESRSEDRATE", "CRP", "LABURIC", "REPTSTATUS", "GRAMSTAIN", "CULT", "LABORGA"   No  results found for: "ALBUMIN", "PREALBUMIN", "CBC"  No results found for: "MG" No results found for: "VD25OH"  No results found for: "PREALBUMIN"     No data to display           There is no height or weight on file to calculate BMI.  Orders:  No orders of the defined types were placed in this encounter.  No orders of the defined types were placed in this encounter.    Procedures: No procedures performed  Clinical Data: No additional findings.  ROS:  All other systems negative, except as noted in the HPI. Review of Systems  Objective: Vital Signs: There were no vitals taken for this visit.  Specialty Comments:  No specialty comments available.  PMFS History: Patient Active Problem List   Diagnosis Date Noted   Right knee meniscal tear 10/02/2020   Unilateral primary osteoarthritis, right knee 04/25/2020   Low back pain 03/21/2020   Statin myopathy 11/16/2019   Atrophic vaginitis 06/18/2017   Sprain of calcaneofibular ligament of left ankle 01/16/2017   Obesity (BMI 30.0-34.9) 01/05/2015   CAD S/P percutaneous coronary angioplasty 04/21/2013   Hypercholesterolemia 04/21/2013   Essential hypertension 04/21/2013   Past Medical History:  Diagnosis Date   CAD (coronary artery disease)    stents   Hyperlipidemia     Family History  Problem Relation Age of Onset   COPD Mother    Heart failure Mother    Diabetes Mother    Valvular heart disease Mother        mitral valve leakage   Heart attack Father 33       multiple heart attacks   Heart attack Sister 78   CAD Brother 58   Heart attack Maternal Grandfather    Heart attack Paternal Grandfather        multiple hearts attacks   Stroke Paternal Grandfather     Past Surgical History:  Procedure Laterality Date   CARDIAC CATHETERIZATION  2001   Percutaneous revascularization precedure with angioplasty to the proximal LAD and first diagonal    CESAREAN SECTION     CHOLECYSTECTOMY     PARTIAL  HYSTERECTOMY     TONSILLECTOMY  1960   Social History   Occupational History   Not on file  Tobacco Use   Smoking status: Never   Smokeless tobacco: Never  Substance and Sexual Activity   Alcohol use: No    Alcohol/week: 0.0 standard drinks of alcohol   Drug use: No   Sexual activity: Not on file

## 2024-03-24 ENCOUNTER — Telehealth: Payer: Self-pay

## 2024-03-24 NOTE — Telephone Encounter (Signed)
 PA fx received from pharmacy for hydrocodone 5/325. Entered key in cover my meds and received notification stating that this medication is on the pt's plan of covered medication and no prior Siegfried Dress is needed. Fax sent to pharmacy and to call with questions.

## 2024-04-04 ENCOUNTER — Other Ambulatory Visit

## 2024-04-08 ENCOUNTER — Ambulatory Visit
Admission: RE | Admit: 2024-04-08 | Discharge: 2024-04-08 | Disposition: A | Discharge status: Home / Self Care | Source: Ambulatory Visit | Attending: Family | Admitting: Family

## 2024-04-08 DIAGNOSIS — M23331 Other meniscus derangements, other medial meniscus, right knee: Secondary | ICD-10-CM | POA: Diagnosis not present

## 2024-04-08 DIAGNOSIS — M25562 Pain in left knee: Secondary | ICD-10-CM

## 2024-04-08 DIAGNOSIS — M25561 Pain in right knee: Secondary | ICD-10-CM | POA: Diagnosis not present

## 2024-04-08 DIAGNOSIS — M1711 Unilateral primary osteoarthritis, right knee: Secondary | ICD-10-CM | POA: Diagnosis not present

## 2024-04-19 ENCOUNTER — Ambulatory Visit: Admitting: Family

## 2024-04-28 DIAGNOSIS — R7303 Prediabetes: Secondary | ICD-10-CM | POA: Diagnosis not present

## 2024-04-28 DIAGNOSIS — E039 Hypothyroidism, unspecified: Secondary | ICD-10-CM | POA: Diagnosis not present

## 2024-04-28 DIAGNOSIS — E7800 Pure hypercholesterolemia, unspecified: Secondary | ICD-10-CM | POA: Diagnosis not present

## 2024-04-28 DIAGNOSIS — I10 Essential (primary) hypertension: Secondary | ICD-10-CM | POA: Diagnosis not present

## 2024-05-02 NOTE — H&P (Signed)
 [facility]   Madison Mosley is an 73 y.o. female.   Chief Complaint: left knee pain HPI:   The patient is a 73 year old woman who is seen today for evaluation of left knee pain which has been ongoing since Easter Sunday.  She cannot recall any specific injury she did have a fall onto her right side which resulted in a foot fracture many months ago she did not initially have knee pain she wonders if her knee pain has been brought on by favoring the foot   She complains of deep constant pain this occurs at rest she has difficulty sleeping on her right side due to pain she is complaining of anterior as well as lateral knee pain worse pain with ambulation   Did have a steroid injection in the knee on April 26 which did not offer any relief   She has been using ibuprofen twice daily as well as Tylenol  with modest relief of her pain.    Past Medical History:  Diagnosis Date   CAD (coronary artery disease)    stents   Hyperlipidemia     Past Surgical History:  Procedure Laterality Date   CARDIAC CATHETERIZATION  2001   Percutaneous revascularization precedure with angioplasty to the proximal LAD and first diagonal    CESAREAN SECTION     CHOLECYSTECTOMY     PARTIAL HYSTERECTOMY     TONSILLECTOMY  1960    Family History  Problem Relation Age of Onset   COPD Mother    Heart failure Mother    Diabetes Mother    Valvular heart disease Mother        mitral valve leakage   Heart attack Father 78       multiple heart attacks   Heart attack Sister 29   CAD Brother 97   Heart attack Maternal Grandfather    Heart attack Paternal Grandfather        multiple hearts attacks   Stroke Paternal Grandfather    Social History:  reports that she has never smoked. She has never used smokeless tobacco. She reports that she does not drink alcohol and does not use drugs.  Allergies:  Allergies  Allergen Reactions   Crestor  [Rosuvastatin Calcium]     Arthralgia (Joint Pain)    Ezetimibe     Arthralgia (Joint Pain)   Pitavastatin     Arthralgia (Joint Pain)   Pork-Derived Products     Migraines    Welchol  [Colesevelam Hcl]     Arthralgia (Joint Pain)   Zocor  [Simvastatin]     Arthralgia (Joint Pain)   Naproxen Rash    Palm of Hand    No medications prior to admission.    No results found for this or any previous visit (from the past 48 hours). No results found.  Review of Systems  All other systems reviewed and are negative.   There were no vitals taken for this visit. Physical Exam  Left Knee Exam    Tenderness  The patient is experiencing tenderness in the medial joint line and lateral joint line.   Range of Motion  The patient has normal left knee ROM.   Tests  Varus: negative Valgus: negative   Other  Erythema: absent Swelling: none Effusion: no effusion present    Patient is alert, oriented, no adenopathy, well-dressed, normal affect, normal respiratory effort.     MRI: Narrative & Impression  MR KNEE WITHOUT IV CONTRAST LEFT   COMPARISON: None.  CLINICAL HISTORY: New trauma, internal derangement suspected. Left knee pain.   PULSE SEQUENCES: Ax PD FS, Sag T2 ACL, Sag PD FS, Cor PD FS & COR T1   FINDINGS: Bones: There is no fracture or contusion pattern. No full-thickness cartilage defect is identified. There is chondromalacia of the patellofemoral compartment and medial compartment. Moderate reactive joint effusion is present. Extensor mechanism is intact.   Ligaments: The ACL, PCL, MCL and fibular collateral ligament are intact.   Menisci: Lateral meniscus is unremarkable. There is a large radial tear at the root of the medial meniscus with medial displacement of the body. There is also a tear at the junction the body and anterior horn of the medial meniscus best seen on coronal image 12 and sagittal image 8.   IMPRESSION: Tricompartmental osteoarthrosis. Mild to moderate chondromalacia with  moderate reactive joint effusion.   Moderate radial tear at the root of the medial meniscus with medial displacement of body. There is second likely radial tear at the junction the body anterior horn of the medial meniscus. See above for more detail.   Assessment/Plan Left knee pain with OA tricompartmental medial meniscus tear.  Plan for Left knee arthroscopy with medial meniscectomy by Dr. Julio Ohm.      Rocky Cipro, PA-C 05/02/2024, 1:01 PM

## 2024-05-04 NOTE — H&P (Addendum)
 Madison Mosley is an 73 y.o. female.   Chief Complaint: left knee pain HPI:   The patient is a 73 year old woman who is seen today for evaluation of left knee pain which has been ongoing since Easter Sunday.  She cannot recall any specific injury she did have a fall onto her right side which resulted in a foot fracture many months ago she did not initially have knee pain she wonders if her knee pain has been brought on by favoring the foot   She complains of deep constant pain this occurs at rest she has difficulty sleeping on her right side due to pain she is complaining of anterior as well as lateral knee pain worse pain with ambulation   Did have a steroid injection in the knee on April 26 which did not offer any relief   She has been using ibuprofen twice daily as well as Tylenol  with modest relief of her pain  MRI was reviewed and demonstrated OA tricompartmental and moderate joint effusion with medial meniscus tears and anterior horn.     Past Medical History:  Diagnosis Date   CAD (coronary artery disease)    stents   Hyperlipidemia     Past Surgical History:  Procedure Laterality Date   CARDIAC CATHETERIZATION  2001   Percutaneous revascularization precedure with angioplasty to the proximal LAD and first diagonal    CESAREAN SECTION     CHOLECYSTECTOMY     PARTIAL HYSTERECTOMY     TONSILLECTOMY  1960    Family History  Problem Relation Age of Onset   COPD Mother    Heart failure Mother    Diabetes Mother    Valvular heart disease Mother        mitral valve leakage   Heart attack Father 55       multiple heart attacks   Heart attack Sister 35   CAD Brother 25   Heart attack Maternal Grandfather    Heart attack Paternal Grandfather        multiple hearts attacks   Stroke Paternal Grandfather    Social History:  reports that she has never smoked. She has never used smokeless tobacco. She reports that she does not drink alcohol and does not use  drugs.  Allergies:  Allergies  Allergen Reactions   Crestor  [Rosuvastatin Calcium]     Arthralgia (Joint Pain)   Ezetimibe     Arthralgia (Joint Pain)   Pitavastatin     Arthralgia (Joint Pain)   Pork-Derived Products     Migraines    Welchol  [Colesevelam Hcl]     Arthralgia (Joint Pain)   Zocor  [Simvastatin]     Arthralgia (Joint Pain)   Naproxen Rash    Palm of Hand    No medications prior to admission.    No results found for this or any previous visit (from the past 48 hours). No results found.  Review of Systems  All other systems reviewed and are negative.    Physical Exam  Patient is alert, oriented, no adenopathy, well-dressed, normal affect, normal respiratory effort.  Left Knee Exam    Tenderness  The patient is experiencing tenderness in the medial joint line and lateral joint line.   Range of Motion  The patient has normal left knee ROM.   Tests  Varus: negative Valgus: negative   Other  Erythema: absent Swelling: none Effusion: no effusion present      Assessment/Plan Left knee with OA and MRI evidence  of medial meniscus tears.  She has failed conservative treatment and got no relief with a steroid injection.  Plan will be for Left knee arthroscopy with medial meniscectomy on 05/06/24.  Rocky Cipro, PA-C 05/04/2024, 8:51 AM

## 2024-05-05 ENCOUNTER — Encounter (HOSPITAL_COMMUNITY): Payer: Self-pay | Admitting: Orthopedic Surgery

## 2024-05-05 ENCOUNTER — Telehealth: Payer: Self-pay

## 2024-05-05 ENCOUNTER — Other Ambulatory Visit: Payer: Self-pay

## 2024-05-05 DIAGNOSIS — E7800 Pure hypercholesterolemia, unspecified: Secondary | ICD-10-CM | POA: Diagnosis not present

## 2024-05-05 DIAGNOSIS — E039 Hypothyroidism, unspecified: Secondary | ICD-10-CM | POA: Diagnosis not present

## 2024-05-05 DIAGNOSIS — I2510 Atherosclerotic heart disease of native coronary artery without angina pectoris: Secondary | ICD-10-CM | POA: Diagnosis not present

## 2024-05-05 DIAGNOSIS — I10 Essential (primary) hypertension: Secondary | ICD-10-CM | POA: Diagnosis not present

## 2024-05-05 DIAGNOSIS — R748 Abnormal levels of other serum enzymes: Secondary | ICD-10-CM | POA: Diagnosis not present

## 2024-05-05 NOTE — Progress Notes (Signed)
 Patient notfied of arrival time change for procedure on 05/06/2024. Patient to arrive at 0630. Patient can have clear liquids until 0530. Patient verbalized understanding.

## 2024-05-05 NOTE — Anesthesia Preprocedure Evaluation (Addendum)
 Anesthesia Evaluation  Patient identified by MRN, date of birth, ID band Patient awake    Reviewed: Allergy & Precautions, NPO status , Patient's Chart, lab work & pertinent test results, reviewed documented beta blocker date and time   Airway Mallampati: II  TM Distance: >3 FB Neck ROM: Full    Dental no notable dental hx. (+) Teeth Intact, Dental Advisory Given   Pulmonary neg pulmonary ROS   Pulmonary exam normal breath sounds clear to auscultation       Cardiovascular hypertension, Pt. on home beta blockers and Pt. on medications + CAD and + Cardiac Stents  Normal cardiovascular exam Rhythm:Regular Rate:Normal     Neuro/Psych  Headaches  negative psych ROS   GI/Hepatic Neg liver ROS,GERD  ,,  Endo/Other  negative endocrine ROS    Renal/GU negative Renal ROS  negative genitourinary   Musculoskeletal  (+) Arthritis ,    Abdominal   Peds  Hematology negative hematology ROS (+)   Anesthesia Other Findings   Reproductive/Obstetrics                             Anesthesia Physical Anesthesia Plan  ASA: 3  Anesthesia Plan: General   Post-op Pain Management: Tylenol  PO (pre-op)*   Induction: Intravenous  PONV Risk Score and Plan: 3 and Ondansetron, Dexamethasone and Treatment may vary due to age or medical condition  Airway Management Planned: LMA  Additional Equipment:   Intra-op Plan:   Post-operative Plan: Extubation in OR  Informed Consent: I have reviewed the patients History and Physical, chart, labs and discussed the procedure including the risks, benefits and alternatives for the proposed anesthesia with the patient or authorized representative who has indicated his/her understanding and acceptance.     Dental advisory given  Plan Discussed with: CRNA  Anesthesia Plan Comments: (PAT note by Rudy Costain, PA-C: 73 year old female with history of early onset CAD (3  separate stent procedures from 2001-2002), HTN, HLD.  Last ischemic evaluation in 2019 was low risk.  Last seen in cardiology follow-up on 05/08/2022 and noted to be stable at that time, no anginal symptoms, no indication for ischemic evaluation.  She was continued on aspirin , metoprolol, Maxzide, pravastatin.  I called and spoke to the patient and confirmed that she was doing well from cardiac standpoint.  She stated she was walking 1.5 miles per day prior to her knee injury in April 2025.  She denied any chest pain, shortness of breath, DOE, other cardiopulmonary complaints.  Denies any changes since last seen by cardiology on 05/08/2022.  Reviewed history with anesthesiologist Dr. Barbaraann Levo.  Advised patient to proceed as planned barring acute status change.  She will need day of surgery labs and evaluation.  Nuclear stress 01/07/2018:  Nuclear stress EF: 63%.  Upsloping ST segment depression ST segment depression was noted during stress in the II and III leads, and returning to baseline after less than 1 minute of recovery.  Clinically and electrically negative for ischemia  Normal perfusion  Low risk study.   )        Anesthesia Quick Evaluation

## 2024-05-05 NOTE — Telephone Encounter (Signed)
 Patient would like to know if she needs to remove Gel  toe nail polish . States she is having SU tomorrow AM.   L ARTHROSCOPY, KNEE, WITH MEDIAL MENISCECTOMY   CB [phone]

## 2024-05-05 NOTE — Progress Notes (Signed)
 SDW call  Patient was given pre-op instructions over the phone. Patient verbalized understanding of instructions provided.     PCP - Dr. Imelda Man Cardiologist - Dr. Karyl Paget Croitoru, LOV approx 2 years ago Pulmonary:    PPM/ICD - denies Device Orders - na Rep Notified - na   Chest x-ray - na EKG -  na Stress Test - 01/07/2018 ECHO - 2001 Cardiac Cath - 2001  Sleep Study/sleep apnea/CPAP:  denies  Non-diabetic  Blood Thinner Instructions: denies Aspirin  Instructions: states last dose 05/05/2024   ERAS Protcol - Clears until 0700  Anesthesia review: Yes. CAD, Hx heart cath   Patient denies shortness of breath, fever, cough and chest pain over the phone call  Your procedure is scheduled on Friday May 06, 2024  Report to [facility] Main Entrance A at 0730  A.M., then check in with the Admitting office.  Call this number if you have problems the morning of surgery:  [phone]   If you have any questions prior to your surgery date call [phone]: Open Monday-Friday 8am-4pm If you experience any cold or flu symptoms such as cough, fever, chills, shortness of breath, etc. between now and your scheduled surgery, please notify us  at the above number    Remember:  Do not eat after midnight the night before your surgery  You may drink clear liquids until  0700 the morning of your surgery.   Clear liquids allowed are: Water, Non-Citrus Juices (without pulp), Carbonated Beverages, Clear Tea, Black Coffee ONLY (NO MILK, CREAM OR POWDERED CREAMER of any kind), and Gatorade   Take these medicines the morning of surgery with A SIP OF WATER:  Metopolol  As needed: Norco  As of today, STOP taking any Aspirin  (unless otherwise instructed by your surgeon) Aleve, Naproxen, Ibuprofen, Motrin, Advil, Goody's, BC's, all herbal medications, fish oil, and all vitamins.

## 2024-05-05 NOTE — Progress Notes (Signed)
 Anesthesia Chart Review: Same day workup  73 year old female with history of early onset CAD (3 separate stent procedures from 2001-2002), HTN, HLD.  Last ischemic evaluation in 2019 was low risk.  Last seen in cardiology follow-up on 05/08/2022 and noted to be stable at that time, no anginal symptoms, no indication for ischemic evaluation.  She was continued on aspirin , metoprolol, Maxzide, pravastatin.  I called and spoke to the patient and confirmed that she was doing well from cardiac standpoint.  She stated she was walking 1.5 miles per day prior to her knee injury in April 2025.  She denied any chest pain, shortness of breath, DOE, other cardiopulmonary complaints.  Denies any changes since last seen by cardiology on 05/08/2022.  Reviewed history with anesthesiologist Dr. Barbaraann Levo.  Advised patient to proceed as planned barring acute status change.  She will need day of surgery labs and evaluation.  Nuclear stress 01/07/2018: Nuclear stress EF: 63%. Upsloping ST segment depression ST segment depression was noted during stress in the II and III leads, and returning to baseline after less than 1 minute of recovery. Clinically and electrically negative for ischemia Normal perfusion Low risk study.    Edilia Gordon [facility] Short Stay Center/Anesthesiology Phone [phone] 05/05/2024 2:01 PM

## 2024-05-05 NOTE — Telephone Encounter (Signed)
 Called patient. No answer LMOM with details.  See message below.

## 2024-05-06 ENCOUNTER — Other Ambulatory Visit: Payer: Self-pay

## 2024-05-06 ENCOUNTER — Encounter (HOSPITAL_COMMUNITY): Payer: Self-pay | Admitting: Orthopedic Surgery

## 2024-05-06 ENCOUNTER — Encounter (HOSPITAL_COMMUNITY)
Admission: RE | Disposition: A | Discharge status: Home / Self Care | Payer: Self-pay | Source: Home / Self Care | Attending: Orthopedic Surgery

## 2024-05-06 ENCOUNTER — Ambulatory Visit (HOSPITAL_COMMUNITY): Admitting: Physician Assistant

## 2024-05-06 ENCOUNTER — Ambulatory Visit (HOSPITAL_COMMUNITY)
Admission: RE | Admit: 2024-05-06 | Discharge: 2024-05-06 | Disposition: A | Discharge status: Home / Self Care | Attending: Orthopedic Surgery | Admitting: Orthopedic Surgery

## 2024-05-06 DIAGNOSIS — I10 Essential (primary) hypertension: Secondary | ICD-10-CM | POA: Insufficient documentation

## 2024-05-06 DIAGNOSIS — Z7982 Long term (current) use of aspirin: Secondary | ICD-10-CM | POA: Diagnosis not present

## 2024-05-06 DIAGNOSIS — Z955 Presence of coronary angioplasty implant and graft: Secondary | ICD-10-CM | POA: Insufficient documentation

## 2024-05-06 DIAGNOSIS — I2510 Atherosclerotic heart disease of native coronary artery without angina pectoris: Secondary | ICD-10-CM | POA: Diagnosis not present

## 2024-05-06 DIAGNOSIS — M958 Other specified acquired deformities of musculoskeletal system: Secondary | ICD-10-CM | POA: Diagnosis not present

## 2024-05-06 DIAGNOSIS — Z791 Long term (current) use of non-steroidal anti-inflammatories (NSAID): Secondary | ICD-10-CM | POA: Insufficient documentation

## 2024-05-06 DIAGNOSIS — M93262 Osteochondritis dissecans, left knee: Secondary | ICD-10-CM

## 2024-05-06 DIAGNOSIS — S83203A Other tear of unspecified meniscus, current injury, right knee, initial encounter: Secondary | ICD-10-CM

## 2024-05-06 DIAGNOSIS — S83242A Other tear of medial meniscus, current injury, left knee, initial encounter: Secondary | ICD-10-CM | POA: Insufficient documentation

## 2024-05-06 DIAGNOSIS — M898X8 Other specified disorders of bone, other site: Secondary | ICD-10-CM | POA: Insufficient documentation

## 2024-05-06 DIAGNOSIS — X58XXXA Exposure to other specified factors, initial encounter: Secondary | ICD-10-CM | POA: Insufficient documentation

## 2024-05-06 DIAGNOSIS — Z79899 Other long term (current) drug therapy: Secondary | ICD-10-CM | POA: Diagnosis not present

## 2024-05-06 DIAGNOSIS — E785 Hyperlipidemia, unspecified: Secondary | ICD-10-CM | POA: Diagnosis not present

## 2024-05-06 DIAGNOSIS — M1711 Unilateral primary osteoarthritis, right knee: Secondary | ICD-10-CM

## 2024-05-06 HISTORY — DX: Headache, unspecified: R51.9

## 2024-05-06 HISTORY — PX: KNEE ARTHROSCOPY WITH MEDIAL MENISECTOMY: SHX5651

## 2024-05-06 HISTORY — DX: Gastro-esophageal reflux disease without esophagitis: K21.9

## 2024-05-06 HISTORY — DX: Essential (primary) hypertension: I10

## 2024-05-06 HISTORY — DX: Unspecified osteoarthritis, unspecified site: M19.90

## 2024-05-06 LAB — COMPREHENSIVE METABOLIC PANEL WITH GFR
ALT: 12 U/L (ref 0–44)
AST: 16 U/L (ref 15–41)
Albumin: 3.2 g/dL — ABNORMAL LOW (ref 3.5–5.0)
Alkaline Phosphatase: 81 U/L (ref 38–126)
Anion gap: 10 (ref 5–15)
BUN: 9 mg/dL (ref 8–23)
CO2: 28 mmol/L (ref 22–32)
Calcium: 9.1 mg/dL (ref 8.9–10.3)
Chloride: 99 mmol/L (ref 98–111)
Creatinine, Ser: 0.76 mg/dL (ref 0.44–1.00)
GFR, Estimated: >60 mL/min (ref >60)
Glucose, Bld: 104 mg/dL — ABNORMAL HIGH (ref 70–99)
Potassium: 3.3 mmol/L — ABNORMAL LOW (ref 3.5–5.1)
Sodium: 137 mmol/L (ref 135–145)
Total Bilirubin: 0.5 mg/dL (ref 0.0–1.2)
Total Protein: 6.8 g/dL (ref 6.5–8.1)

## 2024-05-06 LAB — CBC WITH DIFFERENTIAL/PLATELET
Abs Immature Granulocytes: 0.05 10*3/uL (ref 0.00–0.07)
Basophils Absolute: 0.1 10*3/uL (ref 0.0–0.1)
Basophils Relative: 1 %
Eosinophils Absolute: 0.2 10*3/uL (ref 0.0–0.5)
Eosinophils Relative: 2 %
HCT: 37.9 % (ref 36.0–46.0)
Hemoglobin: 12.1 g/dL (ref 12.0–15.0)
Immature Granulocytes: 1 %
Lymphocytes Relative: 26 %
Lymphs Abs: 2.2 10*3/uL (ref 0.7–4.0)
MCH: 30.2 pg (ref 26.0–34.0)
MCHC: 31.9 g/dL (ref 30.0–36.0)
MCV: 94.5 fL (ref 80.0–100.0)
Monocytes Absolute: 0.5 10*3/uL (ref 0.1–1.0)
Monocytes Relative: 6 %
Neutro Abs: 5.3 10*3/uL (ref 1.7–7.7)
Neutrophils Relative %: 64 %
Platelets: 250 10*3/uL (ref 150–400)
RBC: 4.01 MIL/uL (ref 3.87–5.11)
RDW: 15.4 % (ref 11.5–15.5)
WBC: 8.2 10*3/uL (ref 4.0–10.5)
nRBC: 0 % (ref 0.0–0.2)

## 2024-05-06 SURGERY — ARTHROSCOPY, KNEE, WITH MEDIAL MENISCECTOMY
Anesthesia: General | Site: Knee | Laterality: Left

## 2024-05-06 MED ORDER — OXYCODONE HCL 5 MG PO TABS
5.0000 mg | ORAL_TABLET | Freq: Once | ORAL | Status: AC | PRN
  Administered 2024-05-06: 5 mg via ORAL

## 2024-05-06 MED ORDER — OXYCODONE HCL 5 MG/5ML PO SOLN: 5.0000 mg | Freq: Once | ORAL | Status: AC | PRN

## 2024-05-06 MED ORDER — FENTANYL CITRATE (PF) 250 MCG/5ML IJ SOLN
INTRAMUSCULAR | Status: AC
  Filled 2024-05-06: qty 5

## 2024-05-06 MED ORDER — PROPOFOL 10 MG/ML IV BOLUS
INTRAVENOUS | Status: AC
  Filled 2024-05-06: qty 20

## 2024-05-06 MED ORDER — ONDANSETRON HCL 4 MG/2ML IJ SOLN
INTRAMUSCULAR | Status: DC | PRN
  Administered 2024-05-06: 4 mg via INTRAVENOUS

## 2024-05-06 MED ORDER — ORAL CARE MOUTH RINSE: 15.0000 mL | Freq: Once | OROMUCOSAL | Status: AC

## 2024-05-06 MED ORDER — LACTATED RINGERS IV SOLN: INTRAVENOUS | Status: DC | PRN

## 2024-05-06 MED ORDER — FENTANYL CITRATE (PF) 100 MCG/2ML IJ SOLN
INTRAMUSCULAR | Status: AC
Start: 2024-05-06 — End: 2024-05-06
  Filled 2024-05-06: qty 2

## 2024-05-06 MED ORDER — LACTATED RINGERS IV SOLN: INTRAVENOUS | Status: DC

## 2024-05-06 MED ORDER — SODIUM CHLORIDE 0.9 % IR SOLN
Status: DC | PRN
  Administered 2024-05-06: 3000 mL

## 2024-05-06 MED ORDER — PROPOFOL 10 MG/ML IV BOLUS
INTRAVENOUS | Status: DC | PRN
  Administered 2024-05-06: 200 mg via INTRAVENOUS

## 2024-05-06 MED ORDER — DEXAMETHASONE SODIUM PHOSPHATE 10 MG/ML IJ SOLN
INTRAMUSCULAR | Status: DC | PRN
  Administered 2024-05-06: 10 mg via INTRAVENOUS

## 2024-05-06 MED ORDER — FENTANYL CITRATE (PF) 100 MCG/2ML IJ SOLN
25.0000 ug | INTRAMUSCULAR | Status: DC | PRN
  Administered 2024-05-06 (×3): 50 ug via INTRAVENOUS

## 2024-05-06 MED ORDER — OXYCODONE HCL 5 MG PO TABS
ORAL_TABLET | ORAL | Status: AC
  Filled 2024-05-06: qty 1

## 2024-05-06 MED ORDER — AMISULPRIDE (ANTIEMETIC) 5 MG/2ML IV SOLN: 10.0000 mg | Freq: Once | INTRAVENOUS | Status: DC | PRN

## 2024-05-06 MED ORDER — LABETALOL HCL 5 MG/ML IV SOLN
INTRAVENOUS | Status: AC
  Filled 2024-05-06: qty 4

## 2024-05-06 MED ORDER — FENTANYL CITRATE (PF) 250 MCG/5ML IJ SOLN
INTRAMUSCULAR | Status: DC | PRN
  Administered 2024-05-06 (×3): 50 ug via INTRAVENOUS

## 2024-05-06 MED ORDER — PHENYLEPHRINE 80 MCG/ML (10ML) SYRINGE FOR IV PUSH (FOR BLOOD PRESSURE SUPPORT)
PREFILLED_SYRINGE | INTRAVENOUS | Status: DC | PRN
  Administered 2024-05-06: 160 ug via INTRAVENOUS

## 2024-05-06 MED ORDER — LABETALOL HCL 5 MG/ML IV SOLN
INTRAVENOUS | Status: DC | PRN
  Administered 2024-05-06: 2.5 mg via INTRAVENOUS

## 2024-05-06 MED ORDER — LIDOCAINE 2% (20 MG/ML) 5 ML SYRINGE
INTRAMUSCULAR | Status: DC | PRN
  Administered 2024-05-06: 60 mg via INTRAVENOUS

## 2024-05-06 MED ORDER — FENTANYL CITRATE (PF) 100 MCG/2ML IJ SOLN
INTRAMUSCULAR | Status: AC
  Filled 2024-05-06: qty 2

## 2024-05-06 MED ORDER — CEFAZOLIN SODIUM-DEXTROSE 2-4 GM/100ML-% IV SOLN
2.0000 g | INTRAVENOUS | Status: AC
  Administered 2024-05-06: 2 g via INTRAVENOUS
  Filled 2024-05-06: qty 100

## 2024-05-06 MED ORDER — ACETAMINOPHEN 500 MG PO TABS
1000.0000 mg | ORAL_TABLET | Freq: Once | ORAL | Status: AC
  Administered 2024-05-06: 1000 mg via ORAL
  Filled 2024-05-06: qty 2

## 2024-05-06 MED ORDER — CHLORHEXIDINE GLUCONATE 0.12 % MT SOLN
15.0000 mL | Freq: Once | OROMUCOSAL | Status: AC
  Administered 2024-05-06: 15 mL via OROMUCOSAL
  Filled 2024-05-06: qty 15

## 2024-05-06 SURGICAL SUPPLY — 26 items
BLADE EXCALIBUR 4.0X13 (MISCELLANEOUS) IMPLANT
BNDG COHESIVE 6X5 TAN ST LF (GAUZE/BANDAGES/DRESSINGS) ×1 IMPLANT
BNDG GAUZE DERMACEA FLUFF 4 (GAUZE/BANDAGES/DRESSINGS) ×1 IMPLANT
COVER SURGICAL LIGHT HANDLE (MISCELLANEOUS) ×2 IMPLANT
CUFF TOURN SGL QUICK 42 (TOURNIQUET CUFF) IMPLANT
CUFF TRNQT CYL 34X4.125X (TOURNIQUET CUFF) IMPLANT
DRAPE ARTHROSCOPY W/POUCH 114 (DRAPES) ×1 IMPLANT
DRAPE U-SHAPE 47X51 STRL (DRAPES) ×1 IMPLANT
DRSG EMULSION OIL 3X3 NADH (GAUZE/BANDAGES/DRESSINGS) ×1 IMPLANT
DURAPREP 26ML APPLICATOR (WOUND CARE) ×1 IMPLANT
GAUZE SPONGE 4X4 12PLY STRL (GAUZE/BANDAGES/DRESSINGS) ×1 IMPLANT
GLOVE BIOGEL PI IND STRL 9 (GLOVE) ×1 IMPLANT
GLOVE SURG ORTHO 9.0 STRL STRW (GLOVE) ×1 IMPLANT
GOWN STRL REUS W/ TWL XL LVL3 (GOWN DISPOSABLE) ×3 IMPLANT
KIT BASIN OR (CUSTOM PROCEDURE TRAY) ×1 IMPLANT
KIT TURNOVER KIT B (KITS) ×1 IMPLANT
MANIFOLD NEPTUNE II (INSTRUMENTS) ×1 IMPLANT
NDL 18GX1X1/2 (RX/OR ONLY) (NEEDLE) ×1 IMPLANT
NEEDLE 18GX1X1/2 (RX/OR ONLY) (NEEDLE) ×1 IMPLANT
PACK ARTHROSCOPY DSU (CUSTOM PROCEDURE TRAY) ×1 IMPLANT
PAD ARMBOARD POSITIONER FOAM (MISCELLANEOUS) ×2 IMPLANT
PORT APPOLLO RF 90DEGREE MULTI (SURGICAL WAND) IMPLANT
SUT ETHILON 4 0 PS 2 18 (SUTURE) ×1 IMPLANT
TOWEL GREEN STERILE FF (TOWEL DISPOSABLE) ×2 IMPLANT
TUBE CONNECTING 12X1/4 (SUCTIONS) ×1 IMPLANT
TUBING ARTHROSCOPY IRRIG 16FT (MISCELLANEOUS) ×1 IMPLANT

## 2024-05-06 NOTE — Transfer of Care (Signed)
 Immediate Anesthesia Transfer of Care Note  Patient: Madison Mosley  Procedure(s) Performed: LEFT KNEE ARTHROSCOPY WITH MEDIAL MENISCECTOMY (Left: Knee)  Patient Location: PACU  Anesthesia Type:General  Level of Consciousness: awake, alert , and oriented  Airway & Oxygen Therapy: Patient Spontanous Breathing and Patient connected to nasal cannula oxygen  Post-op Assessment: Report given to RN and Post -op Vital signs reviewed and stable  Post vital signs: Reviewed and stable  Last Vitals:  Vitals Value Taken Time  BP 166/81 05/06/24 10:11  Temp 97.8   Pulse 73 05/06/24 10:13  Resp 19 05/06/24 10:13  SpO2 100 % 05/06/24 10:13  Vitals shown include unfiled device data.  Last Pain:  Vitals:   05/06/24 0733  TempSrc:   PainSc: 4       Patients Stated Pain Goal: 0 (05/06/24 0706)  Complications: No notable events documented.

## 2024-05-06 NOTE — Anesthesia Postprocedure Evaluation (Signed)
 Anesthesia Post Note  Patient: Madison Mosley  Procedure(s) Performed: LEFT KNEE ARTHROSCOPY WITH MEDIAL MENISCECTOMY (Left: Knee)     Patient location during evaluation: PACU Anesthesia Type: General Level of consciousness: awake and alert Pain management: pain level controlled Vital Signs Assessment: post-procedure vital signs reviewed and stable Respiratory status: spontaneous breathing, nonlabored ventilation, respiratory function stable and patient connected to nasal cannula oxygen Cardiovascular status: blood pressure returned to baseline and stable Postop Assessment: no apparent nausea or vomiting Anesthetic complications: no  There were no known notable events for this encounter.  Last Vitals:  Vitals:   05/06/24 1130 05/06/24 1135  BP: (!) 144/64 (!) 144/61  Pulse: (!) 56 (!) 54  Resp: 12 11  Temp:  36.7 C  SpO2: 100% 100%    Last Pain:  Vitals:   05/06/24 1135  TempSrc:   PainSc: 4                  Brittnae Aschenbrenner L Consuella Scurlock

## 2024-05-06 NOTE — Anesthesia Procedure Notes (Signed)
 Procedure Name: LMA Insertion Date/Time: 05/06/2024 9:24 AM  Performed by: Loreda Rodriguez, CRNAPre-anesthesia Checklist: Patient identified, Emergency Drugs available, Suction available and Patient being monitored Patient Re-evaluated:Patient Re-evaluated prior to induction Oxygen Delivery Method: Circle System Utilized Preoxygenation: Pre-oxygenation with 100% oxygen Induction Type: IV induction Ventilation: Mask ventilation without difficulty LMA: LMA inserted LMA Size: 4.0 Number of attempts: 1 Airway Equipment and Method: Bite block Placement Confirmation: positive ETCO2 Tube secured with: Tape Dental Injury: Teeth and Oropharynx as per pre-operative assessment

## 2024-05-06 NOTE — Interval H&P Note (Signed)
 History and Physical Interval Note:  05/06/2024 6:38 AM  Madison Mosley  has presented today for surgery, with the diagnosis of Medial Meniscus Tear Left Knee.  The various methods of treatment have been discussed with the patient and family. After consideration of risks, benefits and other options for treatment, the patient has consented to  Procedure(s) with comments: ARTHROSCOPY, KNEE, WITH MEDIAL MENISCECTOMY (Left) - ARTHROSCOPY LEFT KNEE WITH DEBRIDEMENT AND DECOMPRESSION as a surgical intervention.  The patient's history has been reviewed, patient examined, no change in status, stable for surgery.  I have reviewed the patient's chart and labs.  Questions were answered to the patient's satisfaction.     Madison Mosley V Amous Crewe

## 2024-05-06 NOTE — Op Note (Signed)
 05/06/2024  10:03 AM  PATIENT:  Madison Mosley    PRE-OPERATIVE DIAGNOSIS:  Medial Meniscus Tear Left Knee  POST-OPERATIVE DIAGNOSIS: Medial meniscal tear left knee with large osteochondral defect of the medial femoral condyle and large spurring in the patellofemoral joint.  PROCEDURE:  LEFT KNEE ARTHROSCOPY WITH MEDIAL MENISCECTOMY. Abrasion chondroplasty of the medial femoral condyle back to bleeding viable subchondral bone. Excision bony spurs patellofemoral joint.  SURGEON:  Timothy Ford, MD  PHYSICIAN ASSISTANT:None ANESTHESIA:   General  PREOPERATIVE INDICATIONS:  Madison Mosley is a  73 y.o. female with a diagnosis of Medial Meniscus Tear Left Knee who failed conservative measures and elected for surgical management.    The risks benefits and alternatives were discussed with the patient preoperatively including but not limited to the risks of infection, bleeding, nerve injury, cardiopulmonary complications, the need for revision surgery, among others, and the patient was willing to proceed.  OPERATIVE IMPLANTS:   * No implants in log *  @ENCIMAGES @  OPERATIVE FINDINGS: Patient had plica medially, large ostial chondral defect of the medial femoral condyle, bony spurs in the patellofemoral joint.  OPERATIVE PROCEDURE: Patient was brought the operating room and underwent a general anesthetic.  After adequate levels anesthesia obtained patient's left lower extremity was prepped using DuraPrep draped into a sterile field a timeout was called.  The scope was inserted through the anterior lateral portal no anterior medial working portals established.  Patient had significant synovitis a shaver was used to resect the synovial tissue and the electrocautery wand was used hemostasis.  Visualization patient has a large osteochondral defect of the medial femoral condyle.  This underwent abrasion chondroplasty back to bleeding viable subchondral bone.  There was degenerative tearing of  the medial meniscus and the shaver was used to debride the medial meniscal tear back to a stable margin.  Examination of the lateral joint line figure-of-four position showed peripheral minimal tearing of the lateral meniscus and intact cartilage of the lateral femoral condyle lateral tibial plateau.  With the knee extended there was significant synovitis in the medial gutter with a large medial plica.  This was excised in electrocardio views hemostasis.  There is a large bony spur in the inferior pole of the patella and the shaver was used to resect the bony spur the inferior pole of the patella.  A survey of all compartments was then again performed there were no loose bodies.  The instruments removed the portals closed using 2-0 nylon and a sterile dressing was applied patient was extubated taken the PACU in stable condition.   DISCHARGE PLANNING:  Antibiotic duration: Preoperative antibiotics  Weightbearing: Weightbearing as tolerated  Pain medication: Patient states that she has pain medicine at home.  Dressing care/ Wound VAC: Dry dressing change in 3 days  Ambulatory devices: Crutches as needed  Discharge to: Home.  Follow-up: In the office 1 week post operative.

## 2024-05-06 NOTE — Discharge Instructions (Addendum)
 Weight bear as tolerates.use the crutches for balance and safety until you are walking well.  Use ice as needed for pain and swelling.  Elevate your leg when at rest.  Gradually start bending your kne as the swelling goes down to maintain motion.  Detailed Post-Operative Instructions: 1. Dressing and Wound Care: Dressings: . Keep the dressing dry and intact for at least 3 days, or as directed by your surgeon.  Incisions: . Wash the incisions gently with soap and water after the initial dressing period, following your surgeon's instructions.  Steri-strips: . These may fall off on their own and are okay to leave on until your first post-operative visit.  Bathing: . Avoid soaking the incisions in a tub or pool until instructed by your surgeon.  2. Rest, Ice, and Elevation (RICE):  Rest: Minimize activity, especially during the first few days, to reduce swelling and pain.  Ice: Apply ice packs to the knee for 20 minutes at a time, 3-4 times a day, to control pain and swelling.  Elevation: Keep your leg elevated (especially above your heart) to minimize swelling.   3. Pain Management: Medication: Take prescribed pain medication as directed by your surgeon.  Over-the-counter: Consider over-the-counter pain relievers like ibuprofen or naproxen, but consult your doctor first.   4. Activity and Movement: Weight-bearing: You can typically walk as tolerated, using crutches or a cane as needed.  Range of motion: Start gentle range of motion exercises (ankle pumps, straight leg raises, etc.) as soon as possible.  Physical therapy: Follow your physical therapist's instructions for specific exercises and rehabilitation.   5. Diet and Hydration: Diet: Continue with your normal diet, or opt for bland foods if your stomach is upset. Hydration: Drink plenty of fluids.

## 2024-05-07 ENCOUNTER — Encounter (HOSPITAL_COMMUNITY): Payer: Self-pay | Admitting: Orthopedic Surgery

## 2024-05-10 DIAGNOSIS — R7303 Prediabetes: Secondary | ICD-10-CM | POA: Diagnosis not present

## 2024-05-10 DIAGNOSIS — I2510 Atherosclerotic heart disease of native coronary artery without angina pectoris: Secondary | ICD-10-CM | POA: Diagnosis not present

## 2024-05-10 DIAGNOSIS — I868 Varicose veins of other specified sites: Secondary | ICD-10-CM | POA: Diagnosis not present

## 2024-05-10 DIAGNOSIS — I10 Essential (primary) hypertension: Secondary | ICD-10-CM | POA: Diagnosis not present

## 2024-05-10 DIAGNOSIS — R8271 Bacteriuria: Secondary | ICD-10-CM | POA: Diagnosis not present

## 2024-05-10 DIAGNOSIS — E039 Hypothyroidism, unspecified: Secondary | ICD-10-CM | POA: Diagnosis not present

## 2024-05-10 DIAGNOSIS — M8589 Other specified disorders of bone density and structure, multiple sites: Secondary | ICD-10-CM | POA: Diagnosis not present

## 2024-05-10 DIAGNOSIS — Z9861 Coronary angioplasty status: Secondary | ICD-10-CM | POA: Diagnosis not present

## 2024-05-10 DIAGNOSIS — Z0000 Encounter for general adult medical examination without abnormal findings: Secondary | ICD-10-CM | POA: Diagnosis not present

## 2024-05-11 ENCOUNTER — Telehealth: Payer: Self-pay | Admitting: Orthopedic Surgery

## 2024-05-11 NOTE — Telephone Encounter (Signed)
 Patient called regarding left leg after knee surgery on 05/06/2024. She says that on Saturday she began walking with one crutch and it feels like her left calf is giving out on her. She denies any change in swelling, aside from the swelling she already had from surgery. She says that she does not have much pain, including when she palpates her own calf. She denies any new redness or heat in the area, and denies any fever or chills. She has a follow up with Erin on Friday 05/13/2024 and is asking whether she should be seen sooner. Patient uses ice 3x/day for about 1 hour at a time, but has otherwise not done anything to treat her leg.  CB [phone]

## 2024-05-12 NOTE — Telephone Encounter (Signed)
 I called and confirmed the pt states that the leg is not swollen warm to touch or painful. She said while she was out to lunch this afternoon that it feels like my calf will buckleI asked the pt to please describe what she ment and it is the posterior knee that she will feel some tenderness and does not feel 100% steady. She is 6 days out from a knee scope and medial menisectomy advised to use her crutches that she was given at the hospital just for additional support and we will examine in the office tomorrow.

## 2024-05-13 ENCOUNTER — Ambulatory Visit: Admitting: Physician Assistant

## 2024-05-13 ENCOUNTER — Ambulatory Visit (INDEPENDENT_AMBULATORY_CARE_PROVIDER_SITE_OTHER): Admitting: Family

## 2024-05-13 ENCOUNTER — Encounter: Payer: Self-pay | Admitting: Family

## 2024-05-13 DIAGNOSIS — M958 Other specified acquired deformities of musculoskeletal system: Secondary | ICD-10-CM

## 2024-05-13 DIAGNOSIS — M1712 Unilateral primary osteoarthritis, left knee: Secondary | ICD-10-CM

## 2024-05-13 DIAGNOSIS — Z9889 Other specified postprocedural states: Secondary | ICD-10-CM

## 2024-05-13 MED ORDER — DOXYCYCLINE HYCLATE 100 MG PO TABS: 100.0000 mg | ORAL_TABLET | Freq: Two times a day (BID) | ORAL | 0 refills | Status: DC

## 2024-05-13 NOTE — Progress Notes (Signed)
 Post-Op Visit Note   Patient: Madison Mosley           Date of Birth: October 22, 1951           MRN: [id number] Visit Date: 05/13/2024 PCP: Clarice Nottingham, MD  Chief Complaint:  Chief Complaint  Patient presents with   Left Knee - Routine Post Op    05/06/24 Left knee scope     HPI:  HPI The patient is a 73 year old woman seen 1 week status post left knee arthroscopy.  Today she has some bruising she complains of tightness and fullness especially in the popliteal fossa some giving way of the knee she is ambulating with a straight cane Ortho Exam On examination left knee portals are clean dry and intact sutures harvested today without incident.  There is anterior ecchymosis as well as moderate edema of the knee  Visit Diagnoses: No diagnosis found.  Plan: She would like to hold off on physical therapy given a home exercise form for the knee handout she will work on these at home  Follow-Up Instructions: No follow-ups on file.   Imaging: No results found.  Orders:  No orders of the defined types were placed in this encounter.  No orders of the defined types were placed in this encounter.    PMFS History: Patient Active Problem List   Diagnosis Date Noted   Osteochondral defect of femoral condyle 05/06/2024   Right knee meniscal tear 10/02/2020   Unilateral primary osteoarthritis, right knee 04/25/2020   Low back pain 03/21/2020   Statin myopathy 11/16/2019   Atrophic vaginitis 06/18/2017   Sprain of calcaneofibular ligament of left ankle 01/16/2017   Obesity (BMI 30.0-34.9) 01/05/2015   CAD S/P percutaneous coronary angioplasty 04/21/2013   Hypercholesterolemia 04/21/2013   Essential hypertension 04/21/2013   Past Medical History:  Diagnosis Date   Arthritis    CAD (coronary artery disease)    stents   GERD (gastroesophageal reflux disease)    Headache    Hyperlipidemia    Hypertension     Family History  Problem Relation Age of Onset   COPD Mother    Heart  failure Mother    Diabetes Mother    Valvular heart disease Mother        mitral valve leakage   Heart attack Father 41       multiple heart attacks   Heart attack Sister 75   CAD Brother 31   Heart attack Maternal Grandfather    Heart attack Paternal Grandfather        multiple hearts attacks   Stroke Paternal Grandfather     Past Surgical History:  Procedure Laterality Date   CARDIAC CATHETERIZATION  11/18/1999   Percutaneous revascularization precedure with angioplasty to the proximal LAD and first diagonal    CESAREAN SECTION     CHOLECYSTECTOMY     CORONARY STENT PLACEMENT     KNEE ARTHROSCOPY WITH MEDIAL MENISECTOMY Left 05/06/2024   Procedure: LEFT KNEE ARTHROSCOPY WITH MEDIAL MENISCECTOMY;  Surgeon: Harden Jerona GAILS, MD;  Location: [facility] OR;  Service: Orthopedics;  Laterality: Left;   PARTIAL HYSTERECTOMY     TONSILLECTOMY  11/17/1958   Social History   Occupational History   Not on file  Tobacco Use   Smoking status: Never   Smokeless tobacco: Never  Vaping Use   Vaping status: Never Used  Substance and Sexual Activity   Alcohol use: No    Alcohol/week: 0.0 standard drinks of alcohol   Drug use: No  Sexual activity: Not on file

## 2024-05-13 NOTE — Progress Notes (Signed)
 Post-Op Visit Note   Patient: Madison Mosley           Date of Birth: 26-Apr-1951           MRN: [id number] Visit Date: 05/13/2024 PCP: Clarice Nottingham, MD   Assessment & Plan:  Chief Complaint:  Chief Complaint  Patient presents with   Left Knee - Routine Post Op   Visit Diagnoses:  1. S/P left knee arthroscopy     Plan: Patient is a pleasant 73 year old female who comes in today with concerns about her left knee.  She is 1 week status post left knee arthroscopy by Dr. Harden on 05/06/2024.  She saw Rocky this morning where she was doing well.  Sutures were removed at that time without complication.  She notes that soon after, she started having slight drainage down the front of her leg from the medial portal.  This increased when she got home and sat down.  The flow has slowed.  She is here today for further evaluation and treatment recommendation.  Examination of left knee reveals erythema to both portals which looks more like irritation.  She does have slight serosanguineous drainage to the medial portal.  I am unable to express any more than just a trickle.  She has moderate ecchymosis throughout the anterior knee.  She is neurovascularly intact distally.  At this point, I recommended covering this with a sterile dry dressing.  She will do this once to twice daily.  I will also started her on doxycycline prophylactically.  She will follow-up with Dr. Harden at her regularly scheduled appointment on 05/26/2024 as she will be out of town next week.  She will call with any concerns or questions in the meantime.  Follow-Up Instructions: Return for f/u with Dr. Harden at already scheduled appt on 7/10.   Orders:  No orders of the defined types were placed in this encounter.  Meds ordered this encounter  Medications   doxycycline (VIBRA-TABS) 100 MG tablet    Sig: Take 1 tablet (100 mg total) by mouth 2 (two) times daily.    Dispense:  20 tablet    Refill:  0    Imaging: No results  found.  PMFS History: Patient Active Problem List   Diagnosis Date Noted   Osteochondral defect of femoral condyle 05/06/2024   Right knee meniscal tear 10/02/2020   Unilateral primary osteoarthritis, right knee 04/25/2020   Low back pain 03/21/2020   Statin myopathy 11/16/2019   Atrophic vaginitis 06/18/2017   Sprain of calcaneofibular ligament of left ankle 01/16/2017   Obesity (BMI 30.0-34.9) 01/05/2015   CAD S/P percutaneous coronary angioplasty 04/21/2013   Hypercholesterolemia 04/21/2013   Essential hypertension 04/21/2013   Past Medical History:  Diagnosis Date   Arthritis    CAD (coronary artery disease)    stents   GERD (gastroesophageal reflux disease)    Headache    Hyperlipidemia    Hypertension     Family History  Problem Relation Age of Onset   COPD Mother    Heart failure Mother    Diabetes Mother    Valvular heart disease Mother        mitral valve leakage   Heart attack Father 49       multiple heart attacks   Heart attack Sister 75   CAD Brother 21   Heart attack Maternal Grandfather    Heart attack Paternal Grandfather        multiple hearts attacks   Stroke Paternal  Grandfather     Past Surgical History:  Procedure Laterality Date   CARDIAC CATHETERIZATION  11/18/1999   Percutaneous revascularization precedure with angioplasty to the proximal LAD and first diagonal    CESAREAN SECTION     CHOLECYSTECTOMY     CORONARY STENT PLACEMENT     KNEE ARTHROSCOPY WITH MEDIAL MENISECTOMY Left 05/06/2024   Procedure: LEFT KNEE ARTHROSCOPY WITH MEDIAL MENISCECTOMY;  Surgeon: Harden Jerona GAILS, MD;  Location: [facility] OR;  Service: Orthopedics;  Laterality: Left;   PARTIAL HYSTERECTOMY     TONSILLECTOMY  11/17/1958   Social History   Occupational History   Not on file  Tobacco Use   Smoking status: Never   Smokeless tobacco: Never  Vaping Use   Vaping status: Never Used  Substance and Sexual Activity   Alcohol use: No    Alcohol/week: 0.0 standard drinks  of alcohol   Drug use: No   Sexual activity: Not on file

## 2024-05-26 ENCOUNTER — Ambulatory Visit (INDEPENDENT_AMBULATORY_CARE_PROVIDER_SITE_OTHER): Admitting: Orthopedic Surgery

## 2024-05-26 DIAGNOSIS — M958 Other specified acquired deformities of musculoskeletal system: Secondary | ICD-10-CM

## 2024-05-26 DIAGNOSIS — M1712 Unilateral primary osteoarthritis, left knee: Secondary | ICD-10-CM

## 2024-05-26 DIAGNOSIS — Z9889 Other specified postprocedural states: Secondary | ICD-10-CM

## 2024-05-31 ENCOUNTER — Encounter: Payer: Self-pay | Admitting: Physical Therapy

## 2024-05-31 ENCOUNTER — Ambulatory Visit (INDEPENDENT_AMBULATORY_CARE_PROVIDER_SITE_OTHER): Admitting: Physical Therapy

## 2024-05-31 DIAGNOSIS — R29810 Facial weakness: Secondary | ICD-10-CM | POA: Diagnosis not present

## 2024-05-31 DIAGNOSIS — M25562 Pain in left knee: Secondary | ICD-10-CM

## 2024-05-31 DIAGNOSIS — R600 Localized edema: Secondary | ICD-10-CM

## 2024-05-31 DIAGNOSIS — M6281 Muscle weakness (generalized): Secondary | ICD-10-CM

## 2024-05-31 DIAGNOSIS — R262 Difficulty in walking, not elsewhere classified: Secondary | ICD-10-CM | POA: Diagnosis not present

## 2024-05-31 NOTE — Therapy (Addendum)
 OUTPATIENT PHYSICAL THERAPY LOWER EXTREMITY EVALUATION   Patient Name: Madison Mosley MRN: [id number] DOB:[birth year], 73 y.o., female Today's Date: 05/31/2024  END OF SESSION:  PT End of Session - 05/31/24 0937     Visit Number 1    Number of Visits 20    Date for PT Re-Evaluation 08/12/24    Authorization Type UHC/ Medicare    PT Start Time 0930    PT Stop Time 1010    PT Time Calculation (min) 40 min    Activity Tolerance Patient tolerated treatment well    Behavior During Therapy WFL for tasks assessed/performed          Past Medical History:  Diagnosis Date   Arthritis    CAD (coronary artery disease)    stents   GERD (gastroesophageal reflux disease)    Headache    Hyperlipidemia    Hypertension    Past Surgical History:  Procedure Laterality Date   CARDIAC CATHETERIZATION  11/18/1999   Percutaneous revascularization precedure with angioplasty to the proximal LAD and first diagonal    CESAREAN SECTION     CHOLECYSTECTOMY     CORONARY STENT PLACEMENT     KNEE ARTHROSCOPY WITH MEDIAL MENISECTOMY Left 05/06/2024   Procedure: LEFT KNEE ARTHROSCOPY WITH MEDIAL MENISCECTOMY;  Surgeon: Harden Jerona GAILS, MD;  Location: [facility] OR;  Service: Orthopedics;  Laterality: Left;   PARTIAL HYSTERECTOMY     TONSILLECTOMY  11/17/1958   Patient Active Problem List   Diagnosis Date Noted   Osteochondral defect of femoral condyle 05/06/2024   Right knee meniscal tear 10/02/2020   Unilateral primary osteoarthritis, right knee 04/25/2020   Low back pain 03/21/2020   Statin myopathy 11/16/2019   Atrophic vaginitis 06/18/2017   Sprain of calcaneofibular ligament of left ankle 01/16/2017   Obesity (BMI 30.0-34.9) 01/05/2015   CAD S/P percutaneous coronary angioplasty 04/21/2013   Hypercholesterolemia 04/21/2013   Essential hypertension 04/21/2013    PCP: Clarice Nottingham, MD   REFERRING PROVIDER: Harden Jerona GAILS, MD   REFERRING DIAG:  Diagnosis  [phone] (ICD-10-CM) - S/P left  knee arthroscopy  M95.8 (ICD-10-CM) - Osteochondral defect of femoral condyle  M17.12 (ICD-10-CM) - Primary osteoarthritis of left knee    THERAPY DIAG:  Acute pain of left knee - Plan: PT plan of care cert/re-cert  Difficulty in walking, not elsewhere classified - Plan: PT plan of care cert/re-cert  Muscle weakness (generalized) - Plan: PT plan of care cert/re-cert  Rationale for Evaluation and Treatment: Rehabilitation  ONSET DATE: 05/06/24  SUBJECTIVE:   SUBJECTIVE STATEMENT: Pt s/p left knee arthroscopy with medial meniscectomy, 05/06/24.   PERTINENT HISTORY: Cardiac cath, left knee arthroscopy 05/06/24   PAIN:  NPRS scale: 2-3/10 Pain location: left knee, deep in joint Pain description: achy, throbbing with walking  Aggravating factors: walking, lying on her Rt side Relieving factors: changing positions, resting, ice  PRECAUTIONS: None  WEIGHT BEARING RESTRICTIONS: No  FALLS:  Has patient fallen in last 6 months? No  LIVING ENVIRONMENT: Lives with: lives with their family and lives with their spouse Lives in: House/apartment Stairs: Yes: External: 3 steps; none Has following equipment at home: Single point cane  OCCUPATION: retired  PLOF: Independent  PATIENT GOALS: walk without cane, stop hurting  Next MD visit:   OBJECTIVE:   DIAGNOSTIC FINDINGS: IMPRESSION: 04/08/24 Tricompartmental osteoarthrosis. Mild to moderate chondromalacia with moderate reactive joint effusion.   Moderate radial tear at the root of the medial meniscus with medial displacement of body. There is second likely  radial tear at the junction the body anterior horn of the medial meniscus. See above for more detail.  PATIENT SURVEYS:  Patient-Specific Activity Scoring Scheme  0 represents "unable to perform." 10 represents "able to perform at prior level. 0 1 2 3 4 5 6 7 8 9  10 (Date and Score)   Activity Eval  05/31/24    1. Walk for exercise  4    2. Sit comfortably 4      3. steps 2   4. Standing from sitting 4   5.    Score 3.5    Total score = sum of the activity scores/number of activities Minimum detectable change (90%CI) for average score = 2 points Minimum detectable change (90%CI) for single activity score = 3 points  COGNITION: Overall cognitive status: WFL    SENSATION: WFL  EDEMA:  Circumferential: Rt:   48.0 centimeters       Left:  51.5 centimeters   LOWER EXTREMITY ROM:   ROM Right Eval 05/31/24 Left Eval 05/31/24  Hip flexion    Hip extension    Hip abduction    Hip adduction    Hip internal rotation    Hip external rotation    Knee flexion 118 95  Knee extension -4 0  Ankle dorsiflexion    Ankle plantarflexion    Ankle inversion    Ankle eversion     (Blank rows = not tested)  LOWER EXTREMITY MMT:  MMT Right Eval 05/31/24 Sitting HHD Left Eval 05/31/24 Sitting HHD  Hip flexion 30.7 20.1  Hip extension    Hip abduction    Hip adduction    Hip internal rotation    Hip external rotation    Knee flexion 28.5 21.1  Knee extension 33.3 22.9  Ankle dorsiflexion    Ankle plantarflexion    Ankle inversion    Ankle eversion     (Blank rows = not tested)  GAIT: Distance walked: clinic distance Assistive device utilized: None, cane for community amb and uneven surfaces and stairs Level of assistance: Complete Independence to modified independent Comments: antalgic gait                                                                                                                                                                         TODAY'S TREATMENT  DATE: 05/31/24:  Therex: HEP instruction/performance c cues for techniques, handout provided.  Trial set performed of each for comprehension and symptom assessment.  See below for exercise list Self Care:  Discussed elevation and ice at home and beginning walking both on land and in the  pool  PATIENT EDUCATION:  Education details: HEP, POC Person educated: Patient Education method: Explanation, Demonstration, Verbal cues, and Handouts Education comprehension: verbalized understanding, returned demonstration, and verbal cues required  HOME EXERCISE PROGRAM: Access Code: [email or web address] URL: [email or web address] Date: 05/31/2024 Prepared by: Delon Lunger  Exercises - Supine Bridge  - 2 x daily - 7 x weekly - 2 sets - 10 reps - 5 seconds hold - Supine Heel Slide with Strap  - 2 x daily - 7 x weekly - 2 sets - 10 reps - 3 seconds hold - Supine Knee Extension Strengthening  - 2 x daily - 7 x weekly - 2 sets - 10 reps - 5 seconds hold - Sit to Stand  - 2 x daily - 7 x weekly - 2 sets - 10 reps  ASSESSMENT:  CLINICAL IMPRESSION: Patient is a 73 y.o. who comes to clinic with complaints of left knee pain with mobility, strength and movement coordination deficits that impair their ability to perform usual daily and recreational functional activities without increase difficulty/symptoms at this time.  Patient to benefit from skilled PT services to address impairments and limitations to improve to previous level of function without restriction secondary to condition.   OBJECTIVE IMPAIRMENTS: decreased mobility, difficulty walking, decreased ROM, decreased strength, increased edema, and pain.   ACTIVITY LIMITATIONS: bending, standing, squatting, and sleeping  PARTICIPATION LIMITATIONS: shopping and community activity  PERSONAL FACTORS: 3+ comorbidities: see PMH are also affecting patient's functional outcome.   REHAB POTENTIAL: Good  CLINICAL DECISION MAKING: Stable/uncomplicated  EVALUATION COMPLEXITY: Low   GOALS: Goals reviewed with patient? Yes  SHORT TERM GOALS: (target date for Short term goals are 3 weeks 06/21/2024)   1.  Patient will demonstrate independent use of home exercise program to maintain progress from in clinic treatments.  Goal status:  New  LONG TERM GOALS: (target dates for all long term goals are 10 weeks  08/09/2024 )   1. Patient will demonstrate/report pain at worst less than or equal to 2/10 to facilitate minimal limitation in daily activity secondary to pain symptoms.  Goal status: New   2. Patient will demonstrate independent use of home exercise program to facilitate ability to maintain/progress functional gains from skilled physical therapy services.  Goal status: New   3. Patient will demonstrate Patient specific functional scale avg > or = 5.5  to indicate reduced disability due to condition.   Goal status: New   4.  Patient will demonstrate left LE MMT by >/= 5 pounds throughout to faciltiate usual transfers, stairs, squatting at PLOF for daily life.   Goal status: New   5.  Patient will demonstrate up and down stairs with single hand rail with reciprocal gait pattern.  Goal status: New   6.  Pt will be able to walk 1/2 mile with pain in her left knee of </= 2/10 for neighborhood walking with inclines and declines.  Goal status: New      PLAN:  PT FREQUENCY: 1-2x/week  PT DURATION: 10 weeks  PLANNED INTERVENTIONS: Can include 02853- PT Re-evaluation, 97110-Therapeutic exercises, 97530- Therapeutic activity, [id number]- Neuromuscular re-education, 97535- Self Care, 97140- Manual therapy, [id number]- Gait training, [phone]- Orthotic Fit/training, [phone]- Canalith repositioning, [id number]- Aquatic Therapy, H9716-  Electrical stimulation (unattended), [id number] Physical performance testing, 02983- Vasopneumatic device, [id number]- Ultrasound, [id number]- Traction (mechanical), [id number]- Ionotophoresis 4mg /ml Dexamethasone ,  79439 - Needle insertion w/o injection 1 or 2 muscles, 20561 - Needle insertion w/o injection 3 or more muscles.   Patient/Family education, Balance training, Stair training, Taping, Dry Needling, Joint mobilization, Joint manipulation, Spinal manipulation, Spinal mobilization, Scar mobilization, Vestibular training,  Visual/preceptual remediation/compensation, DME instructions, Cryotherapy, and Moist heat.  All performed as medically necessary.  All included unless contraindicated  PLAN FOR NEXT SESSION: Review HEP knowledge/results, LE strengthening, ROM      Delon JONELLE Lunger, PT, MPT 05/31/2024, 12:34 PM     Date of referral: 05/26/24 Referring provider: Harden Jerona GAILS, MD Referring diagnosis?  Z98.890 (ICD-10-CM) - S/P left knee arthroscopy  M95.8 (ICD-10-CM) - Osteochondral defect of femoral condyle  M17.12 (ICD-10-CM) - Primary osteoarthritis of left knee   Treatment diagnosis? (if different than referring diagnosis) M25.562 , R26.2, M62.81, R60.0  What was this (referring dx) caused by? Surgery (Type: left knee arthroscopy with medial meniscectomy)  Lysle of Condition: Initial Onset (within last 3 months)   Laterality: Lt  Current Functional Measure Score: Patient Specific Functional Scale 3.5  Objective measurements identify impairments when they are compared to normal values, the uninvolved extremity, and prior level of function.  [x]  Yes  []  No  Objective assessment of functional ability: Moderate functional limitations   Briefly describe symptoms: pain, decreased ROM, decreased strength, decreased functional mobility, and edema  How did symptoms start: surgery  Average pain intensity:  Last 24 hours: 2-3/10  Past week: 4-5/10  How often does the pt experience symptoms? Frequently  How much have the symptoms interfered with usual daily activities? Moderately  How has condition changed since care began at this facility? NA - initial visit  In general, how is the patients overall health? Good   BACK PAIN (STarT Back Screening Tool) No

## 2024-06-02 ENCOUNTER — Ambulatory Visit (INDEPENDENT_AMBULATORY_CARE_PROVIDER_SITE_OTHER): Admitting: Rehabilitative and Restorative Service Providers"

## 2024-06-02 ENCOUNTER — Encounter: Payer: Self-pay | Admitting: Rehabilitative and Restorative Service Providers"

## 2024-06-02 DIAGNOSIS — M25562 Pain in left knee: Secondary | ICD-10-CM | POA: Diagnosis not present

## 2024-06-02 DIAGNOSIS — R262 Difficulty in walking, not elsewhere classified: Secondary | ICD-10-CM | POA: Diagnosis not present

## 2024-06-02 DIAGNOSIS — R600 Localized edema: Secondary | ICD-10-CM

## 2024-06-02 DIAGNOSIS — M6281 Muscle weakness (generalized): Secondary | ICD-10-CM | POA: Diagnosis not present

## 2024-06-02 NOTE — Therapy (Signed)
 OUTPATIENT PHYSICAL THERAPY TREATMENT   Patient Name: Madison Mosley MRN: [id number] DOB:1951-09-07, 73 y.o., female Today's Date: 06/02/2024  END OF SESSION:  PT End of Session - 06/02/24 0927     Visit Number 2    Number of Visits 20    Date for PT Re-Evaluation 08/12/24    Authorization Type UHC/ Medicare    Progress Note Due on Visit 10    PT Start Time 0848    PT Stop Time 0937    PT Time Calculation (min) 49 min    Activity Tolerance Patient tolerated treatment well    Behavior During Therapy [facility] for tasks assessed/performed           Past Medical History:  Diagnosis Date   Arthritis    CAD (coronary artery disease)    stents   GERD (gastroesophageal reflux disease)    Headache    Hyperlipidemia    Hypertension    Past Surgical History:  Procedure Laterality Date   CARDIAC CATHETERIZATION  11/18/1999   Percutaneous revascularization precedure with angioplasty to the proximal LAD and first diagonal    CESAREAN SECTION     CHOLECYSTECTOMY     CORONARY STENT PLACEMENT     KNEE ARTHROSCOPY WITH MEDIAL MENISECTOMY Left 05/06/2024   Procedure: LEFT KNEE ARTHROSCOPY WITH MEDIAL MENISCECTOMY;  Surgeon: Harden Jerona GAILS, MD;  Location: [facility] OR;  Service: Orthopedics;  Laterality: Left;   PARTIAL HYSTERECTOMY     TONSILLECTOMY  11/17/1958   Patient Active Problem List   Diagnosis Date Noted   Osteochondral defect of femoral condyle 05/06/2024   Right knee meniscal tear 10/02/2020   Unilateral primary osteoarthritis, right knee 04/25/2020   Low back pain 03/21/2020   Statin myopathy 11/16/2019   Atrophic vaginitis 06/18/2017   Sprain of calcaneofibular ligament of left ankle 01/16/2017   Obesity (BMI 30.0-34.9) 01/05/2015   CAD S/P percutaneous coronary angioplasty 04/21/2013   Hypercholesterolemia 04/21/2013   Essential hypertension 04/21/2013    PCP: Clarice Nottingham, MD   REFERRING PROVIDER: Harden Jerona GAILS, MD   REFERRING DIAG:  Diagnosis  [phone]  (ICD-10-CM) - S/P left knee arthroscopy  M95.8 (ICD-10-CM) - Osteochondral defect of femoral condyle  M17.12 (ICD-10-CM) - Primary osteoarthritis of left knee    THERAPY DIAG:  Acute pain of left knee  Difficulty in walking, not elsewhere classified  Muscle weakness (generalized)  Rationale for Evaluation and Treatment: Rehabilitation  ONSET DATE: 05/06/24  SUBJECTIVE:   SUBJECTIVE STATEMENT: Pt indicated about 4/10 upon arrival today.  Pt indicated feeling some aggravation after prolonged standing activity yesterday.  Did to exercise in pool and also HEP.    PERTINENT HISTORY: Cardiac cath, left knee arthroscopy 05/06/24   PAIN:  NPRS scale: 2-3/10 Pain location: left knee, deep in joint Pain description: achy, throbbing with walking  Aggravating factors: walking, lying on her Rt side Relieving factors: changing positions, resting, ice  PRECAUTIONS: None  WEIGHT BEARING RESTRICTIONS: No  FALLS:  Has patient fallen in last 6 months? No  LIVING ENVIRONMENT: Lives with: lives with their family and lives with their spouse Lives in: House/apartment Stairs: Yes: External: 3 steps; none Has following equipment at home: Single point cane  OCCUPATION: retired  PLOF: Independent  PATIENT GOALS: walk without cane, stop hurting  Next MD visit:   OBJECTIVE:   DIAGNOSTIC FINDINGS: IMPRESSION: 04/08/24 Tricompartmental osteoarthrosis. Mild to moderate chondromalacia with moderate reactive joint effusion.   Moderate radial tear at the root of the medial meniscus with medial displacement  of body. There is second likely radial tear at the junction the body anterior horn of the medial meniscus. See above for more detail.  PATIENT SURVEYS:  Patient-Specific Activity Scoring Scheme  0 represents "unable to perform." 10 represents "able to perform at prior level. 0 1 2 3 4 5 6 7 8 9  10 (Date and Score)   Activity Eval  05/31/24    1. Walk for exercise  4    2. Sit  comfortably 4     3. steps 2   4. Standing from sitting 4   5.    Score 3.5    Total score = sum of the activity scores/number of activities Minimum detectable change (90%CI) for average score = 2 points Minimum detectable change (90%CI) for single activity score = 3 points  COGNITION: 05/31/24 Overall cognitive status: WFL    SENSATION: 05/31/24 WFL  EDEMA:  05/31/24 Circumferential: Rt:   48.0 centimeters       Left:  51.5 centimeters   LOWER EXTREMITY ROM:   ROM Right Eval 05/31/24 Left Eval 05/31/24  Hip flexion    Hip extension    Hip abduction    Hip adduction    Hip internal rotation    Hip external rotation    Knee flexion 118 95  Knee extension -4 0  Ankle dorsiflexion    Ankle plantarflexion    Ankle inversion    Ankle eversion     (Blank rows = not tested)  LOWER EXTREMITY MMT:  MMT Right Eval 05/31/24 Sitting HHD Left Eval 05/31/24 Sitting HHD  Hip flexion 30.7 20.1  Hip extension    Hip abduction    Hip adduction    Hip internal rotation    Hip external rotation    Knee flexion 28.5 21.1  Knee extension 33.3 22.9  Ankle dorsiflexion    Ankle plantarflexion    Ankle inversion    Ankle eversion     (Blank rows = not tested)  GAIT: 05/31/24 Distance walked: clinic distance Assistive device utilized: None, cane for community amb and uneven surfaces and stairs Level of assistance: Complete Independence to modified independent Comments: antalgic gait                                                                                                                                                                         TODAY'S TREATMENT  DATE: 06/02/2024:  Therex: Nustep lvl 5 8 mins UE/LE Incline gastroc bilateral 30 sec x 3  Seated quad set with SLR Lt x 10  Seated LAQ x 15, tailgate flexion education and performance 1 min Seated Lt leg hamstring set 5 sec hold x 5  (education for use on recliner  Neuro Re-ed Fwd/back ambulation in // bars with focus on WB loading to Lt leg.  10 ft x 4 each way with SBA Seated Lt quad set 5 sec hold x 10    Self care Education on [facility] use at times when symptoms are more noted or limping is more noted. Edema control with elevation and icing to help with swelling.  Education on extension prop stretching and positioning to avoid pillow behind knee prolonged.   Vaso 10 mins Lt knee in elevation medium compression 34 degrees.    TODAY'S TREATMENT                                                                          DATE:05/31/24:  Therex: HEP instruction/performance c cues for techniques, handout provided.  Trial set performed of each for comprehension and symptom assessment.  See below for exercise list Self Care:  Discussed elevation and ice at home and beginning walking both on land and in the pool  PATIENT EDUCATION:  Education details: HEP, POC Person educated: Patient Education method: Explanation, Demonstration, Verbal cues, and Handouts Education comprehension: verbalized understanding, returned demonstration, and verbal cues required  HOME EXERCISE PROGRAM: Access Code: [email or web address] URL: [email or web address] Date: 05/31/2024 Prepared by: Delon Lunger  Exercises - Supine Bridge  - 2 x daily - 7 x weekly - 2 sets - 10 reps - 5 seconds hold - Supine Heel Slide with Strap  - 2 x daily - 7 x weekly - 2 sets - 10 reps - 3 seconds hold - Supine Knee Extension Strengthening  - 2 x daily - 7 x weekly - 2 sets - 10 reps - 5 seconds hold - Sit to Stand  - 2 x daily - 7 x weekly - 2 sets - 10 reps  ASSESSMENT:  CLINICAL IMPRESSION: Noted antalgic gait today upon arrival with reduced stance on Lt leg, maintained knee flexion.  Discussed use of SPC when necessary to avoid antalgic gait and increased edema/pains.  Pt to benefit from skilled PT services to help promote mobility gains, strength and  balance improvements.       OBJECTIVE IMPAIRMENTS: decreased mobility, difficulty walking, decreased ROM, decreased strength, increased edema, and pain.   ACTIVITY LIMITATIONS: bending, standing, squatting, and sleeping  PARTICIPATION LIMITATIONS: shopping and community activity  PERSONAL FACTORS: 3+ comorbidities: see PMH are also affecting patient's functional outcome.   REHAB POTENTIAL: Good  CLINICAL DECISION MAKING: Stable/uncomplicated  EVALUATION COMPLEXITY: Low   GOALS: Goals reviewed with patient? Yes  SHORT TERM GOALS: (target date for Short term goals are 3 weeks 06/21/2024)   1.  Patient will demonstrate independent use of home exercise program to maintain progress from in clinic treatments.  Goal status: on going 06/02/2024  LONG TERM GOALS: (target dates for all long term goals are 10 weeks  08/09/2024 )   1. Patient will demonstrate/report pain at worst less than or equal  to 2/10 to facilitate minimal limitation in daily activity secondary to pain symptoms.  Goal status: New   2. Patient will demonstrate independent use of home exercise program to facilitate ability to maintain/progress functional gains from skilled physical therapy services.  Goal status: New   3. Patient will demonstrate Patient specific functional scale avg > or = 5.5  to indicate reduced disability due to condition.   Goal status: New   4.  Patient will demonstrate left LE MMT by >/= 5 pounds throughout to faciltiate usual transfers, stairs, squatting at PLOF for daily life.   Goal status: New   5.  Patient will demonstrate up and down stairs with single hand rail with reciprocal gait pattern.  Goal status: New   6.  Pt will be able to walk 1/2 mile with pain in her left knee of </= 2/10 for neighborhood walking with inclines and declines.  Goal status: New      PLAN:  PT FREQUENCY: 1-2x/week  PT DURATION: 10 weeks  PLANNED INTERVENTIONS: Can include 02853- PT  Re-evaluation, 97110-Therapeutic exercises, 97530- Therapeutic activity, [id number]- Neuromuscular re-education, 97535- Self Care, 97140- Manual therapy, [phone]- Gait training, [phone]- Orthotic Fit/training, [phone]- Canalith repositioning, [id number]- Aquatic Therapy, [phone]- Electrical stimulation (unattended), [id number] Physical performance testing, 97016- Vasopneumatic device, [id number]- Ultrasound, [id number]- Traction (mechanical), [id number]- Ionotophoresis 4mg /ml Dexamethasone ,  79439 - Needle insertion w/o injection 1 or 2 muscles, 20561 - Needle insertion w/o injection 3 or more muscles.   Patient/Family education, Balance training, Stair training, Taping, Dry Needling, Joint mobilization, Joint manipulation, Spinal manipulation, Spinal mobilization, Scar mobilization, Vestibular training, Visual/preceptual remediation/compensation, DME instructions, Cryotherapy, and Moist heat.  All performed as medically necessary.  All included unless contraindicated  PLAN FOR NEXT SESSION: Quad strengthening, mobility gains.  Edema control as necessary.    Ozell Silvan, PT, DPT, OCS, ATC 06/02/24  9:29 AM

## 2024-06-05 ENCOUNTER — Encounter: Payer: Self-pay | Admitting: Orthopedic Surgery

## 2024-06-05 NOTE — Progress Notes (Signed)
 Office Visit Note   Patient: Madison Mosley           Date of Birth: 08/24/1951           MRN: [id number] Visit Date: 05/26/2024              Requested by: Clarice Nottingham, MD [address] [place],  [place] [address] PCP: Clarice Nottingham, MD  Chief Complaint  Patient presents with   Left Knee - Routine Post Op    05/06/2024 left knee scope       HPI: Patient is a 73 year old woman who is 3 weeks status post left knee arthroscopy.  Patient had osteochondral defect as well as meniscal tear.  She is currently ambulating with a cane patient states she wakes up at night with deep knee pain.  Patient states her knee does give way without falls.  Assessment & Plan: Visit Diagnoses:  1. S/P left knee arthroscopy   2. Osteochondral defect of femoral condyle   3. Primary osteoarthritis of left knee     Plan: Will set her up with physical therapy upstairs.  Recommended ice and Voltaren gel.  Follow-Up Instructions: Return in about 4 weeks (around 06/23/2024).   Ortho Exam  Patient is alert, oriented, no adenopathy, well-dressed, normal affect, normal respiratory effort. Examination patient has a mild effusion of the left knee there is no redness or cellulitis.  There is no drainage.    Imaging: No results found. No images are attached to the encounter.  Labs: No results found for: HGBA1C, ESRSEDRATE, CRP, LABURIC, REPTSTATUS, GRAMSTAIN, CULT, LABORGA   Lab Results  Component Value Date   ALBUMIN 3.2 (L) 05/06/2024    No results found for: MG No results found for: VD25OH  No results found for: PREALBUMIN    Latest Ref Rng & Units 05/06/2024    7:10 AM  CBC EXTENDED  WBC 4.0 - 10.5 K/uL 8.2   RBC 3.87 - 5.11 MIL/uL 4.01   Hemoglobin 12.0 - 15.0 g/dL 87.8   HCT 63.9 - 53.9 % 37.9   Platelets 150 - 400 K/uL 250   NEUT# 1.7 - 7.7 K/uL 5.3   Lymph# 0.7 - 4.0 K/uL 2.2      There is no height or weight on file to calculate  BMI.  Orders:  Orders Placed This Encounter  Procedures   Ambulatory referral to Physical Therapy   No orders of the defined types were placed in this encounter.    Procedures: No procedures performed  Clinical Data: No additional findings.  ROS:  All other systems negative, except as noted in the HPI. Review of Systems  Objective: Vital Signs: There were no vitals taken for this visit.  Specialty Comments:  No specialty comments available.  PMFS History: Patient Active Problem List   Diagnosis Date Noted   Osteochondral defect of femoral condyle 05/06/2024   Right knee meniscal tear 10/02/2020   Unilateral primary osteoarthritis, right knee 04/25/2020   Low back pain 03/21/2020   Statin myopathy 11/16/2019   Atrophic vaginitis 06/18/2017   Sprain of calcaneofibular ligament of left ankle 01/16/2017   Obesity (BMI 30.0-34.9) 01/05/2015   CAD S/P percutaneous coronary angioplasty 04/21/2013   Hypercholesterolemia 04/21/2013   Essential hypertension 04/21/2013   Past Medical History:  Diagnosis Date   Arthritis    CAD (coronary artery disease)    stents   GERD (gastroesophageal reflux disease)    Headache    Hyperlipidemia    Hypertension  Family History  Problem Relation Age of Onset   COPD Mother    Heart failure Mother    Diabetes Mother    Valvular heart disease Mother        mitral valve leakage   Heart attack Father 17       multiple heart attacks   Heart attack Sister 75   CAD Brother 42   Heart attack Maternal Grandfather    Heart attack Paternal Grandfather        multiple hearts attacks   Stroke Paternal Grandfather     Past Surgical History:  Procedure Laterality Date   CARDIAC CATHETERIZATION  11/18/1999   Percutaneous revascularization precedure with angioplasty to the proximal LAD and first diagonal    CESAREAN SECTION     CHOLECYSTECTOMY     CORONARY STENT PLACEMENT     KNEE ARTHROSCOPY WITH MEDIAL MENISECTOMY Left 05/06/2024    Procedure: LEFT KNEE ARTHROSCOPY WITH MEDIAL MENISCECTOMY;  Surgeon: Harden Jerona GAILS, MD;  Location: [facility] OR;  Service: Orthopedics;  Laterality: Left;   PARTIAL HYSTERECTOMY     TONSILLECTOMY  11/17/1958   Social History   Occupational History   Not on file  Tobacco Use   Smoking status: Never   Smokeless tobacco: Never  Vaping Use   Vaping status: Never Used  Substance and Sexual Activity   Alcohol use: No    Alcohol/week: 0.0 standard drinks of alcohol   Drug use: No   Sexual activity: Not on file

## 2024-06-12 NOTE — Therapy (Signed)
 OUTPATIENT PHYSICAL THERAPY TREATMENT   Patient Name: Madison Mosley MRN: [id number] DOB:06-Dec-1950, 73 y.o., female Today's Date: 06/13/2024  END OF SESSION:  PT End of Session - 06/13/24 1220     Visit Number 3    Number of Visits 20    Date for PT Re-Evaluation 08/12/24    Authorization Type UHC/ Medicare    PT Start Time 1145    PT Stop Time 1223    PT Time Calculation (min) 38 min    Activity Tolerance Patient tolerated treatment well    Behavior During Therapy WFL for tasks assessed/performed            Past Medical History:  Diagnosis Date   Arthritis    CAD (coronary artery disease)    stents   GERD (gastroesophageal reflux disease)    Headache    Hyperlipidemia    Hypertension    Past Surgical History:  Procedure Laterality Date   CARDIAC CATHETERIZATION  11/18/1999   Percutaneous revascularization precedure with angioplasty to the proximal LAD and first diagonal    CESAREAN SECTION     CHOLECYSTECTOMY     CORONARY STENT PLACEMENT     KNEE ARTHROSCOPY WITH MEDIAL MENISECTOMY Left 05/06/2024   Procedure: LEFT KNEE ARTHROSCOPY WITH MEDIAL MENISCECTOMY;  Surgeon: Harden Jerona GAILS, MD;  Location: [facility] OR;  Service: Orthopedics;  Laterality: Left;   PARTIAL HYSTERECTOMY     TONSILLECTOMY  11/17/1958   Patient Active Problem List   Diagnosis Date Noted   Osteochondral defect of femoral condyle 05/06/2024   Right knee meniscal tear 10/02/2020   Unilateral primary osteoarthritis, right knee 04/25/2020   Low back pain 03/21/2020   Statin myopathy 11/16/2019   Atrophic vaginitis 06/18/2017   Sprain of calcaneofibular ligament of left ankle 01/16/2017   Obesity (BMI 30.0-34.9) 01/05/2015   CAD S/P percutaneous coronary angioplasty 04/21/2013   Hypercholesterolemia 04/21/2013   Essential hypertension 04/21/2013    PCP: Clarice Nottingham, MD   REFERRING PROVIDER: Harden Jerona GAILS, MD   REFERRING DIAG:  Diagnosis  [phone] (ICD-10-CM) - S/P left knee  arthroscopy  M95.8 (ICD-10-CM) - Osteochondral defect of femoral condyle  M17.12 (ICD-10-CM) - Primary osteoarthritis of left knee    THERAPY DIAG:  Acute pain of left knee  Localized edema  Difficulty in walking, not elsewhere classified  Muscle weakness (generalized)  Rationale for Evaluation and Treatment: Rehabilitation  ONSET DATE: 05/06/24  SUBJECTIVE:   SUBJECTIVE STATEMENT: Went out the door without cane on Friday and fell on the 2 steps into the yard.  Hurt the R arm and R side.  States she did not consult with the MD.  No increase in pain in L knee.  Transfers hurt due to the fall not the knee.   PERTINENT HISTORY: Cardiac cath, left knee arthroscopy 05/06/24   PAIN:  NPRS scale: 2/10 Pain location: left knee, deep in joint Pain description: achy, throbbing with walking  Aggravating factors: walking, lying on her Rt side Relieving factors: changing positions, resting, ice  PRECAUTIONS: None  WEIGHT BEARING RESTRICTIONS: No  FALLS:  Has patient fallen in last 6 months? No  LIVING ENVIRONMENT: Lives with: lives with their family and lives with their spouse Lives in: House/apartment Stairs: Yes: External: 3 steps; none Has following equipment at home: Single point cane  OCCUPATION: retired  PLOF: Independent  PATIENT GOALS: walk without cane, stop hurting  Next MD visit:   OBJECTIVE:   DIAGNOSTIC FINDINGS: IMPRESSION: 04/08/24 Tricompartmental osteoarthrosis. Mild to moderate chondromalacia with  moderate reactive joint effusion.   Moderate radial tear at the root of the medial meniscus with medial displacement of body. There is second likely radial tear at the junction the body anterior horn of the medial meniscus. See above for more detail.  PATIENT SURVEYS:  Patient-Specific Activity Scoring Scheme  0 represents "unable to perform." 10 represents "able to perform at prior level. 0 1 2 3 4 5 6 7 8 9  10 (Date and Score)   Activity Eval   05/31/24    1. Walk for exercise  4    2. Sit comfortably 4     3. steps 2   4. Standing from sitting 4   5.    Score 3.5    Total score = sum of the activity scores/number of activities Minimum detectable change (90%CI) for average score = 2 points Minimum detectable change (90%CI) for single activity score = 3 points  COGNITION: 05/31/24 Overall cognitive status: WFL    SENSATION: 05/31/24 WFL  EDEMA:  05/31/24 Circumferential: Rt:   48.0 centimeters       Left:  51.5 centimeters   LOWER EXTREMITY ROM:   ROM Right Eval 05/31/24 Left Eval 05/31/24  Hip flexion    Hip extension    Hip abduction    Hip adduction    Hip internal rotation    Hip external rotation    Knee flexion 118 95  Knee extension -4 0  Ankle dorsiflexion    Ankle plantarflexion    Ankle inversion    Ankle eversion     (Blank rows = not tested)  LOWER EXTREMITY MMT:  MMT Right Eval 05/31/24 Sitting HHD Left Eval 05/31/24 Sitting HHD  Hip flexion 30.7 20.1  Hip extension    Hip abduction    Hip adduction    Hip internal rotation    Hip external rotation    Knee flexion 28.5 21.1  Knee extension 33.3 22.9  Ankle dorsiflexion    Ankle plantarflexion    Ankle inversion    Ankle eversion     (Blank rows = not tested)  GAIT: 05/31/24 Distance walked: clinic distance Assistive device utilized: None, cane for community amb and uneven surfaces and stairs Level of assistance: Complete Independence to modified independent Comments: antalgic gait                                                                                                                                                                         TODAY'S TREATMENT  DATE:  06/13/24 [id number] Nustep Level 5 8 min no arms Incline gastroc bilateral 30 sec x 3  Seated quad set with SLR Lt x 10  Seated LAQ x 15, tailgate flexion education and performance  1 min Seated Lt leg hamstring set 5 sec hold x 5 (education for use on recliner Seated Alt knee flex/ opposite knee ext Seated hip flexion 20x  Manual - Prom L knee  Cold pack 5 minutes in seated    06/02/2024:  Therex: Nustep lvl 5 8 mins UE/LE Incline gastroc bilateral 30 sec x 3  Seated quad set with SLR Lt x 10  Seated LAQ x 15, tailgate flexion education and performance 1 min Seated Lt leg hamstring set 5 sec hold x 5 (education for use on recliner  Neuro Re-ed Fwd/back ambulation in // bars with focus on WB loading to Lt leg.  10 ft x 4 each way with SBA Seated Lt quad set 5 sec hold x 10    Self care Education on [facility] use at times when symptoms are more noted or limping is more noted. Edema control with elevation and icing to help with swelling.  Education on extension prop stretching and positioning to avoid pillow behind knee prolonged.   Vaso 10 mins Lt knee in elevation medium compression 34 degrees.    TODAY'S TREATMENT                                                                          DATE:05/31/24:  Therex: HEP instruction/performance c cues for techniques, handout provided.  Trial set performed of each for comprehension and symptom assessment.  See below for exercise list Self Care:  Discussed elevation and ice at home and beginning walking both on land and in the pool  PATIENT EDUCATION:  Education details: HEP, POC Person educated: Patient Education method: Explanation, Demonstration, Verbal cues, and Handouts Education comprehension: verbalized understanding, returned demonstration, and verbal cues required  HOME EXERCISE PROGRAM: Access Code: [email or web address] URL: [email or web address] Date: 05/31/2024 Prepared by: Delon Lunger  Exercises - Supine Bridge  - 2 x daily - 7 x weekly - 2 sets - 10 reps - 5 seconds hold - Supine Heel Slide with Strap  - 2 x daily - 7 x weekly - 2 sets - 10 reps - 3 seconds hold - Supine Knee Extension  Strengthening  - 2 x daily - 7 x weekly - 2 sets - 10 reps - 5 seconds hold - Sit to Stand  - 2 x daily - 7 x weekly - 2 sets - 10 reps  ASSESSMENT:  CLINICAL IMPRESSION: Adjustments made for positioning- no supine exercises or vaso due to pain on the right side.  Held off on balance exercises.  Increased seated exercises.  Pt needed VC for quad activation.  Demonstrated understanding.  OBJECTIVE IMPAIRMENTS: decreased mobility, difficulty walking, decreased ROM, decreased strength, increased edema, and pain.   ACTIVITY LIMITATIONS: bending, standing, squatting, and sleeping  PARTICIPATION LIMITATIONS: shopping and community activity  PERSONAL FACTORS: 3+ comorbidities: see PMH are also affecting patient's functional outcome.   REHAB POTENTIAL: Good  CLINICAL DECISION MAKING: Stable/uncomplicated  EVALUATION COMPLEXITY: Low   GOALS: Goals reviewed with patient? Yes  SHORT TERM GOALS: (target  date for Short term goals are 3 weeks 06/21/2024)   1.  Patient will demonstrate independent use of home exercise program to maintain progress from in clinic treatments.  Goal status: on going 06/02/2024  LONG TERM GOALS: (target dates for all long term goals are 10 weeks  08/09/2024 )   1. Patient will demonstrate/report pain at worst less than or equal to 2/10 to facilitate minimal limitation in daily activity secondary to pain symptoms.  Goal status: New   2. Patient will demonstrate independent use of home exercise program to facilitate ability to maintain/progress functional gains from skilled physical therapy services.  Goal status: New   3. Patient will demonstrate Patient specific functional scale avg > or = 5.5  to indicate reduced disability due to condition.   Goal status: New   4.  Patient will demonstrate left LE MMT by >/= 5 pounds throughout to faciltiate usual transfers, stairs, squatting at PLOF for daily life.   Goal status: New   5.  Patient will demonstrate up and  down stairs with single hand rail with reciprocal gait pattern.  Goal status: New   6.  Pt will be able to walk 1/2 mile with pain in her left knee of </= 2/10 for neighborhood walking with inclines and declines.  Goal status: New      PLAN:  PT FREQUENCY: 1-2x/week  PT DURATION: 10 weeks  PLANNED INTERVENTIONS: Can include 02853- PT Re-evaluation, 97110-Therapeutic exercises, 97530- Therapeutic activity, [id number]- Neuromuscular re-education, 97535- Self Care, 97140- Manual therapy, [phone]- Gait training, [phone]- Orthotic Fit/training, [phone]- Canalith repositioning, [id number]- Aquatic Therapy, [phone]- Electrical stimulation (unattended), [id number] Physical performance testing, 97016- Vasopneumatic device, [id number]- Ultrasound, [id number]- Traction (mechanical), [id number]- Ionotophoresis 4mg /ml Dexamethasone ,  [id number] - Needle insertion w/o injection 1 or 2 muscles, 20561 - Needle insertion w/o injection 3 or more muscles.   Patient/Family education, Balance training, Stair training, Taping, Dry Needling, Joint mobilization, Joint manipulation, Spinal manipulation, Spinal mobilization, Scar mobilization, Vestibular training, Visual/preceptual remediation/compensation, DME instructions, Cryotherapy, and Moist heat.  All performed as medically necessary.  All included unless contraindicated  PLAN FOR NEXT SESSION: Increase balance exercises as tolerated since held off in today's session.   Burnard Meth, PT 06/13/24  12:23 PM

## 2024-06-13 ENCOUNTER — Ambulatory Visit (INDEPENDENT_AMBULATORY_CARE_PROVIDER_SITE_OTHER)

## 2024-06-13 DIAGNOSIS — R262 Difficulty in walking, not elsewhere classified: Secondary | ICD-10-CM

## 2024-06-13 DIAGNOSIS — M25562 Pain in left knee: Secondary | ICD-10-CM

## 2024-06-13 DIAGNOSIS — M6281 Muscle weakness (generalized): Secondary | ICD-10-CM | POA: Diagnosis not present

## 2024-06-13 DIAGNOSIS — R600 Localized edema: Secondary | ICD-10-CM

## 2024-06-15 NOTE — Therapy (Incomplete)
 OUTPATIENT PHYSICAL THERAPY TREATMENT   Patient Name: Madison Mosley MRN: [id number] DOB:[birth year], 73 y.o., female Today's Date: 06/15/2024  END OF SESSION:***      Past Medical History:  Diagnosis Date   Arthritis    CAD (coronary artery disease)    stents   GERD (gastroesophageal reflux disease)    Headache    Hyperlipidemia    Hypertension    Past Surgical History:  Procedure Laterality Date   CARDIAC CATHETERIZATION  11/18/1999   Percutaneous revascularization precedure with angioplasty to the proximal LAD and first diagonal    CESAREAN SECTION     CHOLECYSTECTOMY     CORONARY STENT PLACEMENT     KNEE ARTHROSCOPY WITH MEDIAL MENISECTOMY Left 05/06/2024   Procedure: LEFT KNEE ARTHROSCOPY WITH MEDIAL MENISCECTOMY;  Surgeon: Harden Jerona GAILS, MD;  Location: [facility] OR;  Service: Orthopedics;  Laterality: Left;   PARTIAL HYSTERECTOMY     TONSILLECTOMY  11/17/1958   Patient Active Problem List   Diagnosis Date Noted   Osteochondral defect of femoral condyle 05/06/2024   Right knee meniscal tear 10/02/2020   Unilateral primary osteoarthritis, right knee 04/25/2020   Low back pain 03/21/2020   Statin myopathy 11/16/2019   Atrophic vaginitis 06/18/2017   Sprain of calcaneofibular ligament of left ankle 01/16/2017   Obesity (BMI 30.0-34.9) 01/05/2015   CAD S/P percutaneous coronary angioplasty 04/21/2013   Hypercholesterolemia 04/21/2013   Essential hypertension 04/21/2013    PCP: Clarice Nottingham, MD   REFERRING PROVIDER: Clarice Nottingham, MD   REFERRING DIAG:  Diagnosis  [phone] (ICD-10-CM) - S/P left knee arthroscopy  M95.8 (ICD-10-CM) - Osteochondral defect of femoral condyle  M17.12 (ICD-10-CM) - Primary osteoarthritis of left knee    THERAPY DIAG:  No diagnosis found.  Rationale for Evaluation and Treatment: Rehabilitation  ONSET DATE: 05/06/24  SUBJECTIVE:   SUBJECTIVE STATEMENT: ****Went out the door without cane on Friday and fell on the 2 steps into  the yard.  Hurt the R arm and R side.  States she did not consult with the MD.  No increase in pain in L knee.  Transfers hurt due to the fall not the knee.   PERTINENT HISTORY: Cardiac cath, left knee arthroscopy 05/06/24   PAIN:  ***NPRS scale: 2/10 Pain location: left knee, deep in joint Pain description: achy, throbbing with walking  Aggravating factors: walking, lying on her Rt side Relieving factors: changing positions, resting, ice  PRECAUTIONS: None  WEIGHT BEARING RESTRICTIONS: No  FALLS:  Has patient fallen in last 6 months? No  LIVING ENVIRONMENT: Lives with: lives with their family and lives with their spouse Lives in: House/apartment Stairs: Yes: External: 3 steps; none Has following equipment at home: Single point cane  OCCUPATION: retired  PLOF: Independent  PATIENT GOALS: walk without cane, stop hurting  Next MD visit:   OBJECTIVE:   DIAGNOSTIC FINDINGS: IMPRESSION: 04/08/24 Tricompartmental osteoarthrosis. Mild to moderate chondromalacia with moderate reactive joint effusion.   Moderate radial tear at the root of the medial meniscus with medial displacement of body. There is second likely radial tear at the junction the body anterior horn of the medial meniscus. See above for more detail.  PATIENT SURVEYS:  Patient-Specific Activity Scoring Scheme  0 represents "unable to perform." 10 represents "able to perform at prior level. 0 1 2 3 4 5 6 7 8 9  10 (Date and Score)   Activity Eval  05/31/24    1. Walk for exercise  4    2. Sit comfortably 4  3. steps 2   4. Standing from sitting 4   5.    Score 3.5    Total score = sum of the activity scores/number of activities Minimum detectable change (90%CI) for average score = 2 points Minimum detectable change (90%CI) for single activity score = 3 points  COGNITION: 05/31/24 Overall cognitive status: WFL    SENSATION: 05/31/24 WFL  EDEMA:  05/31/24 Circumferential: Rt:   48.0  centimeters       Left:  51.5 centimeters   LOWER EXTREMITY ROM:   ROM Right Eval 05/31/24 Left Eval 05/31/24  Hip flexion    Hip extension    Hip abduction    Hip adduction    Hip internal rotation    Hip external rotation    Knee flexion 118 95  Knee extension -4 0  Ankle dorsiflexion    Ankle plantarflexion    Ankle inversion    Ankle eversion     (Blank rows = not tested)  LOWER EXTREMITY MMT:  MMT Right Eval 05/31/24 Sitting HHD Left Eval 05/31/24 Sitting HHD  Hip flexion 30.7 20.1  Hip extension    Hip abduction    Hip adduction    Hip internal rotation    Hip external rotation    Knee flexion 28.5 21.1  Knee extension 33.3 22.9  Ankle dorsiflexion    Ankle plantarflexion    Ankle inversion    Ankle eversion     (Blank rows = not tested)  GAIT: 05/31/24 Distance walked: clinic distance Assistive device utilized: None, cane for community amb and uneven surfaces and stairs Level of assistance: Complete Independence to modified independent Comments: antalgic gait                                                                                                                                                                         TODAY'S TREATMENT                                                                          DATE:  06/16/24****      06/13/24 [id number] Nustep Level 5 8 min no arms Incline gastroc bilateral 30 sec x 3  Seated quad set with SLR Lt x 10  Seated LAQ x 15, tailgate flexion education and performance 1 min Seated Lt leg hamstring set 5 sec hold x 5 (education for use on recliner Seated Alt knee flex/ opposite knee ext Seated hip flexion 20x  Manual - Prom L knee  Cold pack 5 minutes in seated    06/02/2024:  Therex: Nustep lvl 5 8 mins UE/LE Incline gastroc bilateral 30 sec x 3  Seated quad set with SLR Lt x 10  Seated LAQ x 15, tailgate flexion education and performance 1 min Seated Lt leg hamstring set 5 sec hold x 5  (education for use on recliner  Neuro Re-ed Fwd/back ambulation in // bars with focus on WB loading to Lt leg.  10 ft x 4 each way with SBA Seated Lt quad set 5 sec hold x 10    Self care Education on [facility] use at times when symptoms are more noted or limping is more noted. Edema control with elevation and icing to help with swelling.  Education on extension prop stretching and positioning to avoid pillow behind knee prolonged.   Vaso 10 mins Lt knee in elevation medium compression 34 degrees.    TODAY'S TREATMENT                                                                          DATE:05/31/24:  Therex: HEP instruction/performance c cues for techniques, handout provided.  Trial set performed of each for comprehension and symptom assessment.  See below for exercise list Self Care:  Discussed elevation and ice at home and beginning walking both on land and in the pool  PATIENT EDUCATION:  Education details: HEP, POC Person educated: Patient Education method: Explanation, Demonstration, Verbal cues, and Handouts Education comprehension: verbalized understanding, returned demonstration, and verbal cues required  HOME EXERCISE PROGRAM: Access Code: [email or web address] URL: [email or web address] Date: 05/31/2024 Prepared by: Delon Lunger  Exercises - Supine Bridge  - 2 x daily - 7 x weekly - 2 sets - 10 reps - 5 seconds hold - Supine Heel Slide with Strap  - 2 x daily - 7 x weekly - 2 sets - 10 reps - 3 seconds hold - Supine Knee Extension Strengthening  - 2 x daily - 7 x weekly - 2 sets - 10 reps - 5 seconds hold - Sit to Stand  - 2 x daily - 7 x weekly - 2 sets - 10 reps  ASSESSMENT:  CLINICAL IMPRESSION: ***Adjustments made for positioning- no supine exercises or vaso due to pain on the right side.  Held off on balance exercises.  Increased seated exercises.  Pt needed VC for quad activation.  Demonstrated understanding.  OBJECTIVE IMPAIRMENTS: decreased mobility,  difficulty walking, decreased ROM, decreased strength, increased edema, and pain.   ACTIVITY LIMITATIONS: bending, standing, squatting, and sleeping  PARTICIPATION LIMITATIONS: shopping and community activity  PERSONAL FACTORS: 3+ comorbidities: see PMH are also affecting patient's functional outcome.   REHAB POTENTIAL: Good  CLINICAL DECISION MAKING: Stable/uncomplicated  EVALUATION COMPLEXITY: Low   GOALS: Goals reviewed with patient? Yes  SHORT TERM GOALS: (target date for Short term goals are 3 weeks 06/21/2024)   1.  Patient will demonstrate independent use of home exercise program to maintain progress from in clinic treatments.  Goal status: on going 06/02/2024  LONG TERM GOALS: (target dates for all long term goals are 10 weeks  08/09/2024 )   1. Patient will demonstrate/report pain at worst less than or equal to  2/10 to facilitate minimal limitation in daily activity secondary to pain symptoms.  Goal status: New   2. Patient will demonstrate independent use of home exercise program to facilitate ability to maintain/progress functional gains from skilled physical therapy services.  Goal status: New   3. Patient will demonstrate Patient specific functional scale avg > or = 5.5  to indicate reduced disability due to condition.   Goal status: New   4.  Patient will demonstrate left LE MMT by >/= 5 pounds throughout to faciltiate usual transfers, stairs, squatting at PLOF for daily life.   Goal status: New   5.  Patient will demonstrate up and down stairs with single hand rail with reciprocal gait pattern.  Goal status: New   6.  Pt will be able to walk 1/2 mile with pain in her left knee of </= 2/10 for neighborhood walking with inclines and declines.  Goal status: New      PLAN:  PT FREQUENCY: 1-2x/week  PT DURATION: 10 weeks  PLANNED INTERVENTIONS: Can include 02853- PT Re-evaluation, 97110-Therapeutic exercises, 97530- Therapeutic activity, [id number]-  Neuromuscular re-education, 97535- Self Care, 97140- Manual therapy, [phone]- Gait training, [phone]- Orthotic Fit/training, [phone]- Canalith repositioning, [id number]- Aquatic Therapy, [phone]- Electrical stimulation (unattended), [id number] Physical performance testing, 97016- Vasopneumatic device, [id number]- Ultrasound, [id number]- Traction (mechanical), [id number]- Ionotophoresis 4mg /ml Dexamethasone ,  [id number] - Needle insertion w/o injection 1 or 2 muscles, 20561 - Needle insertion w/o injection 3 or more muscles.   Patient/Family education, Balance training, Stair training, Taping, Dry Needling, Joint mobilization, Joint manipulation, Spinal manipulation, Spinal mobilization, Scar mobilization, Vestibular training, Visual/preceptual remediation/compensation, DME instructions, Cryotherapy, and Moist heat.  All performed as medically necessary.  All included unless contraindicated  PLAN FOR NEXT SESSION: ***Increase balance exercises as tolerated since held off in today's session.   Burnard Meth, PT 06/15/24  3:07 PM

## 2024-06-16 ENCOUNTER — Ambulatory Visit: Payer: Self-pay | Admitting: Nurse Practitioner

## 2024-06-16 ENCOUNTER — Encounter

## 2024-06-16 ENCOUNTER — Other Ambulatory Visit: Payer: Self-pay

## 2024-06-16 ENCOUNTER — Ambulatory Visit
Admission: RE | Admit: 2024-06-16 | Discharge: 2024-06-16 | Disposition: A | Discharge status: Home / Self Care | Source: Ambulatory Visit | Attending: Family Medicine | Admitting: Family Medicine

## 2024-06-16 ENCOUNTER — Ambulatory Visit (INDEPENDENT_AMBULATORY_CARE_PROVIDER_SITE_OTHER)

## 2024-06-16 VITALS — BP 160/84 | HR 77 | Temp 98.9°F | Resp 17

## 2024-06-16 DIAGNOSIS — R0781 Pleurodynia: Secondary | ICD-10-CM

## 2024-06-16 DIAGNOSIS — R918 Other nonspecific abnormal finding of lung field: Secondary | ICD-10-CM | POA: Diagnosis not present

## 2024-06-16 DIAGNOSIS — S20211A Contusion of right front wall of thorax, initial encounter: Secondary | ICD-10-CM

## 2024-06-16 DIAGNOSIS — Z043 Encounter for examination and observation following other accident: Secondary | ICD-10-CM | POA: Diagnosis not present

## 2024-06-16 MED ORDER — LIDOCAINE 5 % EX PTCH: 1.0000 | MEDICATED_PATCH | CUTANEOUS | 0 refills | Status: DC

## 2024-06-16 NOTE — ED Triage Notes (Signed)
 Pt fell down 2.5 cement steps outside a week ago. Pt denies hitting head. Pt fell on right side and injured rib area. Pt has scattered bruising on right arm. Pt states last night having SOB with trying to sit or stand. Pt is eupneic at this time. Pt is able to speak in complete sentences. No bruising noted in rib area. Pt states she couldn't move her right arm last night.

## 2024-06-16 NOTE — Discharge Instructions (Addendum)
 Your x-ray was negative for fracture.  You may do the Lidoderm  patch to the area as needed to help with pain.  You may also splint the area by holding a pillow or pressure to the ribs when you know you have to move, cough, sneeze to help reduce your pain.  Continue ice to the area as needed.  You may also continue ibuprofen or Tylenol  as needed.  Please follow-up with your PCP in 2 to 3 days for recheck.  Please go to the ER for any worsening symptoms.  I hope you feel better soon!

## 2024-06-16 NOTE — ED Provider Notes (Signed)
 [facility]    CSN: [id number] Arrival date & time: 06/16/24  1253      History   Chief Complaint No chief complaint on file.   HPI Madison Mosley is a 73 y.o. female presents for rib injury.  Patient reports 1 week ago she missed a step causing her to fall 2 steps down to the floor landing onto her right side.  No head injury or LOC.  States she has had some bruising on her right ribs and her right forearm since then.  She felt it was getting better until she started to do exercises for her left knee meniscus repair surgery that would cause her to stand up and sit.  After that she began having worsening right lateral rib pain with some shortness of breath.  No hemoptysis.  She takes 81 mg of aspirin  and has been taking over-the-counter ibuprofen about 4 tablets a day, but no other blood thinning medications.  Denies any history of rib surgeries or fractures in the past.  No neck or back pain.  No other concerns at this time  HPI  Past Medical History:  Diagnosis Date   Arthritis    CAD (coronary artery disease)    stents   GERD (gastroesophageal reflux disease)    Headache    Hyperlipidemia    Hypertension     Patient Active Problem List   Diagnosis Date Noted   Osteochondral defect of femoral condyle 05/06/2024   Right knee meniscal tear 10/02/2020   Unilateral primary osteoarthritis, right knee 04/25/2020   Low back pain 03/21/2020   Statin myopathy 11/16/2019   Atrophic vaginitis 06/18/2017   Sprain of calcaneofibular ligament of left ankle 01/16/2017   Obesity (BMI 30.0-34.9) 01/05/2015   CAD S/P percutaneous coronary angioplasty 04/21/2013   Hypercholesterolemia 04/21/2013   Essential hypertension 04/21/2013    Past Surgical History:  Procedure Laterality Date   CARDIAC CATHETERIZATION  11/18/1999   Percutaneous revascularization precedure with angioplasty to the proximal LAD and first diagonal    CESAREAN SECTION     CHOLECYSTECTOMY     CORONARY  STENT PLACEMENT     KNEE ARTHROSCOPY WITH MEDIAL MENISECTOMY Left 05/06/2024   Procedure: LEFT KNEE ARTHROSCOPY WITH MEDIAL MENISCECTOMY;  Surgeon: Harden Jerona GAILS, MD;  Location: [facility] OR;  Service: Orthopedics;  Laterality: Left;   PARTIAL HYSTERECTOMY     TONSILLECTOMY  11/17/1958    OB History   No obstetric history on file.      Home Medications    Prior to Admission medications   Medication Sig Start Date End Date Taking? Authorizing Provider  lidocaine  (LIDODERM ) 5 % Place 1 patch onto the skin daily. Place for 12 hours and remove for 12 hours 06/16/24  Yes Loreda Myla SAUNDERS, NP  aspirin  EC 81 MG tablet Take 1 tablet (81 mg total) by mouth daily. 10/30/16   Croitoru, Mihai, MD  doxycycline  (VIBRA -TABS) 100 MG tablet Take 1 tablet (100 mg total) by mouth 2 (two) times daily. 05/13/24   Jule Ronal CROME, PA-C  ELDERBERRY PO Take 1 capsule by mouth daily.    [provider]  HYDROcodone -acetaminophen  (NORCO/VICODIN) 5-325 MG tablet Take 1 tablet by mouth every 6 (six) hours as needed for moderate pain (pain score 4-6). 03/23/24   Zamora, Erin R, NP  MAGNESIUM PO Take 120 mg by mouth 4 (four) times a week.    [provider]  metoprolol succinate (TOPROL-XL) 50 MG 24 hr tablet Take 25 mg by mouth  daily. 03/10/13   [provider]  Multiple Vitamin (MULTIVITAMIN WITH MINERALS) TABS Take 1 tablet by mouth 4 (four) times a week.    [provider]  Probiotic Product (PROBIOTIC PO) Take 1 capsule by mouth daily.    [provider]  QUERCETIN PO Take 1 tablet by mouth daily. With bromelain    [provider]  SPIRULINA PO Take 1 tablet by mouth daily.    [provider]  triamcinolone  cream (KENALOG) 0.1 % Apply 1 Application topically as needed (itch). 04/13/15   [provider]  triamterene-hydrochlorothiazide (MAXZIDE-25) 37.5-25 MG per tablet Take 0.5 tablets by mouth daily.  03/10/13   [provider]    Family  History Family History  Problem Relation Age of Onset   COPD Mother    Heart failure Mother    Diabetes Mother    Valvular heart disease Mother        mitral valve leakage   Heart attack Father 57       multiple heart attacks   Heart attack Sister 60   CAD Brother 52   Heart attack Maternal Grandfather    Heart attack Paternal Grandfather        multiple hearts attacks   Stroke Paternal Grandfather     Social History Social History   Tobacco Use   Smoking status: Never   Smokeless tobacco: Never  Vaping Use   Vaping status: Never Used  Substance Use Topics   Alcohol use: No    Alcohol/week: 0.0 standard drinks of alcohol   Drug use: No     Allergies   Crestor  [rosuvastatin calcium], Ezetimibe, Pitavastatin, Pork-derived products, Welchol  [colesevelam hcl], Zocor  [simvastatin], and Naproxen   Review of Systems Review of Systems  Musculoskeletal:        Rib pain after fall x 1 week     Physical Exam Triage Vital Signs ED Triage Vitals  Encounter Vitals Group     BP 06/16/24 1306 (!) 160/84     Girls Systolic BP Percentile --      Girls Diastolic BP Percentile --      Boys Systolic BP Percentile --      Boys Diastolic BP Percentile --      Pulse Rate 06/16/24 1306 77     Resp 06/16/24 1306 17     Temp 06/16/24 1306 98.9 F (37.2 C)     Temp Source 06/16/24 1306 Oral     SpO2 06/16/24 1306 95 %     Weight --      Height --      Head Circumference --      Peak Flow --      Pain Score 06/16/24 1304 8     Pain Loc --      Pain Education --      Exclude from Growth Chart --    No data found.  Updated Vital Signs BP (!) 160/84   Pulse 77   Temp 98.9 F (37.2 C) (Oral)   Resp 17   SpO2 95%   Visual Acuity Right Eye Distance:   Left Eye Distance:   Bilateral Distance:    Right Eye Near:   Left Eye Near:    Bilateral Near:     Physical Exam Vitals and nursing note reviewed.  Constitutional:      General: She is not in acute distress.     Appearance: Normal appearance. She is not ill-appearing.  HENT:  Head: Normocephalic and atraumatic.  Eyes:     Pupils: Pupils are equal, round, and reactive to light.  Cardiovascular:     Rate and Rhythm: Normal rate.  Pulmonary:     Effort: Pulmonary effort is normal.  Chest:       Comments: There is very faint fading ecchymosis to the lateral mid right ribs with tenderness to palpation to site that does extend to right anterior ribs.  Equal chest expansion bilaterally.  No flail chest. Skin:    General: Skin is warm and dry.     Comments: Very faint fading ecchymosis to the right forearm.  Neurological:     General: No focal deficit present.     Mental Status: She is alert and oriented to person, place, and time.  Psychiatric:        Mood and Affect: Mood normal.        Behavior: Behavior normal.      UC Treatments / Results  Labs (all labs ordered are listed, but only abnormal results are displayed) Labs Reviewed - No data to display  EKG   Radiology DG Ribs Unilateral W/Chest Right Result Date: 06/16/2024 CLINICAL DATA:  Clemens onto right ribs 1 week ago. EXAM: RIGHT RIBS AND CHEST - 3+ VIEW COMPARISON:  09/21/2020. FINDINGS: Bilateral lungs appear hyperlucent with coarse bronchovascular markings, concerning for underlying COPD. Bilateral lungs otherwise appear clear. No dense consolidation or lung collapse. Bilateral costophrenic angles are clear. Normal cardio-mediastinal silhouette. No acute osseous abnormalities. No acute rib fracture.  No focal rib lesion. The soft tissues are within normal limits. IMPRESSION: *No acute cardiopulmonary abnormality. No acute rib fracture. Probable COPD. Electronically Signed   By: Ree Molt M.D.   On: 06/16/2024 13:31    Procedures Procedures (including critical care time)  Medications Ordered in UC Medications - No data to display  Initial Impression / Assessment and Plan / UC Course  I have reviewed the triage vital  signs and the nursing notes.  Pertinent labs & imaging results that were available during my care of the patient were reviewed by me and considered in my medical decision making (see chart for details).     Reviewed exam and symptoms with patient.  No red flags.  X-ray negative for rib fracture.  Discussed rib contusion and symptomatic treatment.  She can continue Tylenol  or ibuprofen as needed.  Lidoderm  patch as needed.  Discussed splinting and continued RICE therapy.  Advised PCP follow-up 2 to 3 days for recheck.  Strict ER precautions reviewed and patient verbalized understanding. Final Clinical Impressions(s) / UC Diagnoses   Final diagnoses:  Rib pain on right side  Contusion of rib on right side, initial encounter     Discharge Instructions      Your x-ray was negative for fracture.  You may do the Lidoderm  patch to the area as needed to help with pain.  You may also splint the area by holding a pillow or pressure to the ribs when you know you have to move, cough, sneeze to help reduce your pain.  Continue ice to the area as needed.  You may also continue ibuprofen or Tylenol  as needed.  Please follow-up with your PCP in 2 to 3 days for recheck.  Please go to the ER for any worsening symptoms.  I hope you feel better soon!     ED Prescriptions     Medication Sig Dispense Auth. Provider   lidocaine  (LIDODERM ) 5 % Place 1 patch onto the  skin daily. Place for 12 hours and remove for 12 hours 10 patch Irean Kendricks, Jodi R, NP      PDMP not reviewed this encounter.   Loreda Myla SAUNDERS, NP 06/16/24 1341

## 2024-06-20 ENCOUNTER — Encounter: Payer: Self-pay | Admitting: Physical Therapy

## 2024-06-20 ENCOUNTER — Ambulatory Visit (INDEPENDENT_AMBULATORY_CARE_PROVIDER_SITE_OTHER): Admitting: Physical Therapy

## 2024-06-20 DIAGNOSIS — M6281 Muscle weakness (generalized): Secondary | ICD-10-CM | POA: Diagnosis not present

## 2024-06-20 DIAGNOSIS — M25562 Pain in left knee: Secondary | ICD-10-CM | POA: Diagnosis not present

## 2024-06-20 DIAGNOSIS — R262 Difficulty in walking, not elsewhere classified: Secondary | ICD-10-CM | POA: Diagnosis not present

## 2024-06-20 DIAGNOSIS — R600 Localized edema: Secondary | ICD-10-CM

## 2024-06-20 NOTE — Therapy (Signed)
 OUTPATIENT PHYSICAL THERAPY TREATMENT   Patient Name: Madison Mosley MRN: [id number] DOB:07/25/1951, 73 y.o., female Today's Date: 06/20/2024  END OF SESSION:  PT End of Session - 06/20/24 1012     Visit Number 4    Number of Visits 20    Date for PT Re-Evaluation 08/12/24    Authorization Type UHC/ Medicare    Progress Note Due on Visit 10    PT Start Time 0933    PT Stop Time 1013    PT Time Calculation (min) 40 min    Behavior During Therapy WFL for tasks assessed/performed             Past Medical History:  Diagnosis Date   Arthritis    CAD (coronary artery disease)    stents   GERD (gastroesophageal reflux disease)    Headache    Hyperlipidemia    Hypertension    Past Surgical History:  Procedure Laterality Date   CARDIAC CATHETERIZATION  11/18/1999   Percutaneous revascularization precedure with angioplasty to the proximal LAD and first diagonal    CESAREAN SECTION     CHOLECYSTECTOMY     CORONARY STENT PLACEMENT     KNEE ARTHROSCOPY WITH MEDIAL MENISECTOMY Left 05/06/2024   Procedure: LEFT KNEE ARTHROSCOPY WITH MEDIAL MENISCECTOMY;  Surgeon: Harden Jerona GAILS, MD;  Location: [facility] OR;  Service: Orthopedics;  Laterality: Left;   PARTIAL HYSTERECTOMY     TONSILLECTOMY  11/17/1958   Patient Active Problem List   Diagnosis Date Noted   Osteochondral defect of femoral condyle 05/06/2024   Right knee meniscal tear 10/02/2020   Unilateral primary osteoarthritis, right knee 04/25/2020   Low back pain 03/21/2020   Statin myopathy 11/16/2019   Atrophic vaginitis 06/18/2017   Sprain of calcaneofibular ligament of left ankle 01/16/2017   Obesity (BMI 30.0-34.9) 01/05/2015   CAD S/P percutaneous coronary angioplasty 04/21/2013   Hypercholesterolemia 04/21/2013   Essential hypertension 04/21/2013    PCP: Clarice Nottingham, MD   REFERRING PROVIDER: Harden Jerona GAILS, MD   REFERRING DIAG:  Diagnosis  [phone] (ICD-10-CM) - S/P left knee arthroscopy  M95.8 (ICD-10-CM)  - Osteochondral defect of femoral condyle  M17.12 (ICD-10-CM) - Primary osteoarthritis of left knee    THERAPY DIAG:  Acute pain of left knee  Localized edema  Difficulty in walking, not elsewhere classified  Muscle weakness (generalized)  Rationale for Evaluation and Treatment: Rehabilitation  ONSET DATE: 05/06/24  SUBJECTIVE:   SUBJECTIVE STATEMENT: Pt stating she fell off the side of her porch on her right side on Friday 06/10/24. Pt stating pain was 10/10, pt went to Urgent care and received X-rays because she was concerned she may have fx her ribs, but her images came back clear. Pt stating laughing, sneezing and coughing hurts.   PERTINENT HISTORY: Cardiac cath, left knee arthroscopy 05/06/24   PAIN:  NPRS scale: 3/10 pain in her left knee, 8-9/10 pain on her right side due to recent fall.  Pain location: left knee, deep in joint Pain description: achy, throbbing with walking  Aggravating factors: walking, lying on her Rt side Relieving factors: changing positions, resting, ice  PRECAUTIONS: None  WEIGHT BEARING RESTRICTIONS: No  FALLS:  Has patient fallen in last 6 months? No  LIVING ENVIRONMENT: Lives with: lives with their family and lives with their spouse Lives in: House/apartment Stairs: Yes: External: 3 steps; none Has following equipment at home: Single point cane  OCCUPATION: retired  PLOF: Independent  PATIENT GOALS: walk without cane, stop hurting  Next  MD visit:   OBJECTIVE:   DIAGNOSTIC FINDINGS: IMPRESSION: 04/08/24 Tricompartmental osteoarthrosis. Mild to moderate chondromalacia with moderate reactive joint effusion.   Moderate radial tear at the root of the medial meniscus with medial displacement of body. There is second likely radial tear at the junction the body anterior horn of the medial meniscus. See above for more detail.  PATIENT SURVEYS:  Patient-Specific Activity Scoring Scheme  0 represents "unable to perform." 10  represents "able to perform at prior level. 0 1 2 3 4 5 6 7 8 9  10 (Date and Score)   Activity Eval  05/31/24    1. Walk for exercise  4    2. Sit comfortably 4     3. steps 2   4. Standing from sitting 4   5.    Score 3.5    Total score = sum of the activity scores/number of activities Minimum detectable change (90%CI) for average score = 2 points Minimum detectable change (90%CI) for single activity score = 3 points  COGNITION: 05/31/24 Overall cognitive status: WFL    SENSATION: 05/31/24 WFL  EDEMA:  05/31/24 Circumferential: Rt:   48.0 centimeters       Left:  51.5 centimeters   LOWER EXTREMITY ROM:   ROM Right Eval 05/31/24 Left Eval 05/31/24 Left 06/20/24  Hip flexion     Hip extension     Hip abduction     Hip adduction     Hip internal rotation     Hip external rotation     Knee flexion 118 95 108  Knee extension -4 0 0  Ankle dorsiflexion     Ankle plantarflexion     Ankle inversion     Ankle eversion      (Blank rows = not tested)  LOWER EXTREMITY MMT:  MMT Right Eval 05/31/24 Sitting HHD Left Eval 05/31/24 Sitting HHD  Hip flexion 30.7 20.1  Hip extension    Hip abduction    Hip adduction    Hip internal rotation    Hip external rotation    Knee flexion 28.5 21.1  Knee extension 33.3 22.9  Ankle dorsiflexion    Ankle plantarflexion    Ankle inversion    Ankle eversion     (Blank rows = not tested)  GAIT: 05/31/24 Distance walked: clinic distance Assistive device utilized: None, cane for community amb and uneven surfaces and stairs Level of assistance: Complete Independence to modified independent Comments: antalgic gait                                                                                                                                                                         TODAY'S TREATMENT  DATE:  06/20/24:  TherEx:  Nustep: level 5 x 8 minutes  UE/LE Seated SLR: 2# 2 x 10  Seated LAQ: 3# 3 x 10 holding 2-3 sec  Supine bridge: 2 x 10  Sit to supine c assistance of 2 to help prevent increase in pain in pt's Rt side Supine to sit: log roll  TherActivites:  Sit to stand x 10  NMR:  Standing on Airex mat: feet apart eyes open x 1 minute Standing on Airex mat: feet together eyes open x 1 minute with HHA Standing on Airex mat: staggered stance bil x 1 minute each c HHA Manual  PROM of left knee, flexion/extension in supine  Seated: IR and distraction with flexion in sitting       06/13/24 1145-1223 Nustep Level 5 8 min no arms Incline gastroc bilateral 30 sec x 3  Seated quad set with SLR Lt x 10  Seated LAQ x 15, tailgate flexion education and performance 1 min Seated Lt leg hamstring set 5 sec hold x 5 (education for use on recliner Seated Alt knee flex/ opposite knee ext Seated hip flexion 20x  Manual - Prom L knee  Cold pack 5 minutes in seated    06/02/2024:  Therex: Nustep lvl 5 8 mins UE/LE Incline gastroc bilateral 30 sec x 3  Seated quad set with SLR Lt x 10  Seated LAQ x 15, tailgate flexion education and performance 1 min Seated Lt leg hamstring set 5 sec hold x 5 (education for use on recliner  Neuro Re-ed Fwd/back ambulation in // bars with focus on WB loading to Lt leg.  10 ft x 4 each way with SBA Seated Lt quad set 5 sec hold x 10    Self care Education on [facility] use at times when symptoms are more noted or limping is more noted. Edema control with elevation and icing to help with swelling.  Education on extension prop stretching and positioning to avoid pillow behind knee prolonged.   Vaso 10 mins Lt knee in elevation medium compression 34 degrees.    TODAY'S TREATMENT                                                                          DATE:05/31/24:  Therex: HEP instruction/performance c cues for techniques, handout provided.  Trial set performed of each for comprehension and symptom  assessment.  See below for exercise list Self Care:  Discussed elevation and ice at home and beginning walking both on land and in the pool   PATIENT EDUCATION:  Education details: HEP, POC Person educated: Patient Education method: Explanation, Demonstration, Verbal cues, and Handouts Education comprehension: verbalized understanding, returned demonstration, and verbal cues required  HOME EXERCISE PROGRAM: Access Code: [email or web address] URL: [email or web address] Date: 05/31/2024 Prepared by: Delon Lunger  Exercises - Supine Bridge  - 2 x daily - 7 x weekly - 2 sets - 10 reps - 5 seconds hold - Supine Heel Slide with Strap  - 2 x daily - 7 x weekly - 2 sets - 10 reps - 3 seconds hold - Supine Knee Extension Strengthening  - 2 x daily - 7 x weekly - 2 sets - 10 reps - 5 seconds hold - Sit  to Stand  - 2 x daily - 7 x weekly - 2 sets - 10 reps  ASSESSMENT:  CLINICAL IMPRESSION: Treatment today was limited by other pain secondary to recent fall on 06/10/24 from pt's porch. Pt amb today in clinic on level surface with her straight cane. Pt with no reports of increased left knee pain during session. Recommending continuation of skilled PT to maximize pt's function.   OBJECTIVE IMPAIRMENTS: decreased mobility, difficulty walking, decreased ROM, decreased strength, increased edema, and pain.   ACTIVITY LIMITATIONS: bending, standing, squatting, and sleeping  PARTICIPATION LIMITATIONS: shopping and community activity  PERSONAL FACTORS: 3+ comorbidities: see PMH are also affecting patient's functional outcome.   REHAB POTENTIAL: Good  CLINICAL DECISION MAKING: Stable/uncomplicated  EVALUATION COMPLEXITY: Low   GOALS: Goals reviewed with patient? Yes  SHORT TERM GOALS: (target date for Short term goals are 3 weeks 06/21/2024)   1.  Patient will demonstrate independent use of home exercise program to maintain progress from in clinic treatments.  Goal status: on going  06/02/2024  LONG TERM GOALS: (target dates for all long term goals are 10 weeks  08/09/2024 )   1. Patient will demonstrate/report pain at worst less than or equal to 2/10 to facilitate minimal limitation in daily activity secondary to pain symptoms.  Goal status: New   2. Patient will demonstrate independent use of home exercise program to facilitate ability to maintain/progress functional gains from skilled physical therapy services.  Goal status: New   3. Patient will demonstrate Patient specific functional scale avg > or = 5.5  to indicate reduced disability due to condition.   Goal status: New   4.  Patient will demonstrate left LE MMT by >/= 5 pounds throughout to faciltiate usual transfers, stairs, squatting at PLOF for daily life.   Goal status: New   5.  Patient will demonstrate up and down stairs with single hand rail with reciprocal gait pattern.  Goal status: New   6.  Pt will be able to walk 1/2 mile with pain in her left knee of </= 2/10 for neighborhood walking with inclines and declines.  Goal status: New      PLAN:  PT FREQUENCY: 1-2x/week  PT DURATION: 10 weeks  PLANNED INTERVENTIONS: Can include 02853- PT Re-evaluation, 97110-Therapeutic exercises, 97530- Therapeutic activity, [id number]- Neuromuscular re-education, 97535- Self Care, 97140- Manual therapy, [phone]- Gait training, [id number]- Orthotic Fit/training, [phone]- Canalith repositioning, [id number]- Aquatic Therapy, [phone]- Electrical stimulation (unattended), [id number] Physical performance testing, 97016- Vasopneumatic device, [id number]- Ultrasound, [id number]- Traction (mechanical), [id number]- Ionotophoresis 4mg /ml Dexamethasone ,  79439 - Needle insertion w/o injection 1 or 2 muscles, 20561 - Needle insertion w/o injection 3 or more muscles.   Patient/Family education, Balance training, Stair training, Taping, Dry Needling, Joint mobilization, Joint manipulation, Spinal manipulation, Spinal mobilization, Scar mobilization, Vestibular  training, Visual/preceptual remediation/compensation, DME instructions, Cryotherapy, and Moist heat.  All performed as medically necessary.  All included unless contraindicated  PLAN FOR NEXT SESSION: Progress balance, gait training progressing to no device, LE strengthening   Delon Lunger, PT, MPT 06/20/24 10:18 AM   06/20/24  10:18 AM

## 2024-06-23 ENCOUNTER — Encounter: Payer: Self-pay | Admitting: Rehabilitative and Restorative Service Providers"

## 2024-06-23 ENCOUNTER — Ambulatory Visit (INDEPENDENT_AMBULATORY_CARE_PROVIDER_SITE_OTHER): Admitting: Rehabilitative and Restorative Service Providers"

## 2024-06-23 ENCOUNTER — Ambulatory Visit (INDEPENDENT_AMBULATORY_CARE_PROVIDER_SITE_OTHER): Admitting: Orthopedic Surgery

## 2024-06-23 DIAGNOSIS — R262 Difficulty in walking, not elsewhere classified: Secondary | ICD-10-CM | POA: Diagnosis not present

## 2024-06-23 DIAGNOSIS — R600 Localized edema: Secondary | ICD-10-CM

## 2024-06-23 DIAGNOSIS — Z9889 Other specified postprocedural states: Secondary | ICD-10-CM

## 2024-06-23 DIAGNOSIS — M25562 Pain in left knee: Secondary | ICD-10-CM

## 2024-06-23 DIAGNOSIS — M6281 Muscle weakness (generalized): Secondary | ICD-10-CM

## 2024-06-23 MED ORDER — HYDROCODONE-ACETAMINOPHEN 5-325 MG PO TABS: 1.0000 | ORAL_TABLET | Freq: Four times a day (QID) | ORAL | 0 refills | Status: DC | PRN

## 2024-06-23 NOTE — Therapy (Signed)
 OUTPATIENT PHYSICAL THERAPY TREATMENT   Patient Name: Madison Mosley MRN: [id number] DOB:27-Dec-1950, 73 y.o., female Today's Date: 06/23/2024  END OF SESSION:  PT End of Session - 06/23/24 1053     Visit Number 5    Number of Visits 20    Date for PT Re-Evaluation 08/12/24    Authorization Type UHC/ Medicare    Authorization Time Period 05/31/2024 - 07/26/2024    Authorization - Visit Number 5    Authorization - Number of Visits 16    Progress Note Due on Visit 10    PT Start Time 1056    PT Stop Time 1136    PT Time Calculation (min) 40 min    Activity Tolerance Patient tolerated treatment well    Behavior During Therapy WFL for tasks assessed/performed              Past Medical History:  Diagnosis Date   Arthritis    CAD (coronary artery disease)    stents   GERD (gastroesophageal reflux disease)    Headache    Hyperlipidemia    Hypertension    Past Surgical History:  Procedure Laterality Date   CARDIAC CATHETERIZATION  11/18/1999   Percutaneous revascularization precedure with angioplasty to the proximal LAD and first diagonal    CESAREAN SECTION     CHOLECYSTECTOMY     CORONARY STENT PLACEMENT     KNEE ARTHROSCOPY WITH MEDIAL MENISECTOMY Left 05/06/2024   Procedure: LEFT KNEE ARTHROSCOPY WITH MEDIAL MENISCECTOMY;  Surgeon: Harden Jerona GAILS, MD;  Location: [facility] OR;  Service: Orthopedics;  Laterality: Left;   PARTIAL HYSTERECTOMY     TONSILLECTOMY  11/17/1958   Patient Active Problem List   Diagnosis Date Noted   Osteochondral defect of femoral condyle 05/06/2024   Right knee meniscal tear 10/02/2020   Unilateral primary osteoarthritis, right knee 04/25/2020   Low back pain 03/21/2020   Statin myopathy 11/16/2019   Atrophic vaginitis 06/18/2017   Sprain of calcaneofibular ligament of left ankle 01/16/2017   Obesity (BMI 30.0-34.9) 01/05/2015   CAD S/P percutaneous coronary angioplasty 04/21/2013   Hypercholesterolemia 04/21/2013   Essential hypertension  04/21/2013    PCP: Clarice Nottingham, MD   REFERRING PROVIDER: Harden Jerona GAILS, MD   REFERRING DIAG:  Diagnosis  [phone] (ICD-10-CM) - S/P left knee arthroscopy  M95.8 (ICD-10-CM) - Osteochondral defect of femoral condyle  M17.12 (ICD-10-CM) - Primary osteoarthritis of left knee    THERAPY DIAG:  Acute pain of left knee  Localized edema  Difficulty in walking, not elsewhere classified  Muscle weakness (generalized)  Rationale for Evaluation and Treatment: Rehabilitation  ONSET DATE: 05/06/24  SUBJECTIVE:   SUBJECTIVE STATEMENT: Pt indicated 3.5 today upon arrival.  Feeling stiffness.  Reported taking OTC medicine.   PERTINENT HISTORY: Cardiac cath, left knee arthroscopy 05/06/24   PAIN:  NPRS scale: 3.5/10 Pain location: left knee Pain description: achy, throbbing with walking  Aggravating factors: walking, lying on her Rt side Relieving factors: changing positions, resting, ice  PRECAUTIONS: None  WEIGHT BEARING RESTRICTIONS: No  FALLS:  Has patient fallen in last 6 months? No  LIVING ENVIRONMENT: Lives with: lives with their family and lives with their spouse Lives in: House/apartment Stairs: Yes: External: 3 steps; none Has following equipment at home: Single point cane  OCCUPATION: retired  PLOF: Independent  PATIENT GOALS: walk without cane, stop hurting  Next MD visit:   OBJECTIVE:   DIAGNOSTIC FINDINGS: IMPRESSION: 04/08/24 Tricompartmental osteoarthrosis. Mild to moderate chondromalacia with moderate reactive joint effusion.  Moderate radial tear at the root of the medial meniscus with medial displacement of body. There is second likely radial tear at the junction the body anterior horn of the medial meniscus. See above for more detail.  PATIENT SURVEYS:  Patient-Specific Activity Scoring Scheme  0 represents "unable to perform." 10 represents "able to perform at prior level. 0 1 2 3 4 5 6 7 8 9  10 (Date and Score)   Activity  Eval  05/31/24    1. Walk for exercise  4    2. Sit comfortably 4     3. steps 2   4. Standing from sitting 4   5.    Score 3.5    Total score = sum of the activity scores/number of activities Minimum detectable change (90%CI) for average score = 2 points Minimum detectable change (90%CI) for single activity score = 3 points  COGNITION: 05/31/24 Overall cognitive status: WFL    SENSATION: 05/31/24 WFL  EDEMA:  05/31/24 Circumferential: Rt:   48.0 centimeters       Left:  51.5 centimeters   LOWER EXTREMITY ROM:   ROM Right Eval 05/31/24 Left Eval 05/31/24 Left 06/20/24  Hip flexion     Hip extension     Hip abduction     Hip adduction     Hip internal rotation     Hip external rotation     Knee flexion 118 95 108  Knee extension -4 0 0  Ankle dorsiflexion     Ankle plantarflexion     Ankle inversion     Ankle eversion      (Blank rows = not tested)  LOWER EXTREMITY MMT:  MMT Right Eval 05/31/24 Sitting HHD Left Eval 05/31/24 Sitting HHD Left 06/23/2024  Hip flexion 30.7 20.1   Hip extension     Hip abduction     Hip adduction     Hip internal rotation     Hip external rotation     Knee flexion 28.5 21.1   Knee extension 33.3 22.9 30, 31 lbs  Ankle dorsiflexion     Ankle plantarflexion     Ankle inversion     Ankle eversion      (Blank rows = not tested)  GAIT: 05/31/24 Distance walked: clinic distance Assistive device utilized: None, cane for community amb and uneven surfaces and stairs Level of assistance: Complete Independence to modified independent Comments: antalgic gait                                                                                                                                                                         TODAY'S TREATMENT  DATE: 06/23/2024 Therex: UBE LE fwd for ROM, seat 12  -   lvl 1.0 8 mins Seated Lt knee LAQ with end range pauses in  each direction for mobility gains x 10  Supine Lt knee LAQ in 90 deg hip flexion for mobility/swelling reduction x 10  Discussed sitting knee flexion c Rt leg overpressure stretch as well for flexion gains.    TherActivity Leg press double leg (quick up, slow eccentric lowering) x 15 75 lbs,  single leg 31 lbs x 15 - performed bilaterally    Neuro Re-ed Feet close together on airex foam 1 min x 1 with SBA Feet close together with head turns Lt and Rt x 10 each way with SBA, look up /down x 10 Feet close together PF/DF alternating x 15 each way on foam with SBA to min A at times.     TODAY'S TREATMENT                                                                          DATE: 06/20/24:  TherEx:  Nustep: level 5 x 8 minutes UE/LE Seated SLR: 2# 2 x 10  Seated LAQ: 3# 3 x 10 holding 2-3 sec  Supine bridge: 2 x 10  Sit to supine c assistance of 2 to help prevent increase in pain in pt's Rt side Supine to sit: log roll  TherActivites:  Sit to stand x 10  NMR:  Standing on Airex mat: feet apart eyes open x 1 minute Standing on Airex mat: feet together eyes open x 1 minute with HHA Standing on Airex mat: staggered stance bil x 1 minute each c HHA Manual  PROM of left knee, flexion/extension in supine  Seated: IR and distraction with flexion in sitting    TODAY'S TREATMENT                                                                          DATE:06/13/24 [id number] Nustep Level 5 8 min no arms Incline gastroc bilateral 30 sec x 3  Seated quad set with SLR Lt x 10  Seated LAQ x 15, tailgate flexion education and performance 1 min Seated Lt leg hamstring set 5 sec hold x 5 (education for use on recliner Seated Alt knee flex/ opposite knee ext Seated hip flexion 20x  Manual - Prom L knee  Cold pack 5 minutes in seated   PATIENT EDUCATION:  Eval: Education details: HEP, POC Person educated: Patient Education method: Programmer, multimedia, Facilities manager, Verbal cues, and  Handouts Education comprehension: verbalized understanding, returned demonstration, and verbal cues required  HOME EXERCISE PROGRAM: Access Code: [email or web address] URL: [email or web address] Date: 05/31/2024 Prepared by: Delon Lunger  Exercises - Supine Bridge  - 2 x daily - 7 x weekly - 2 sets - 10 reps - 5 seconds hold - Supine Heel Slide with Strap  - 2 x daily - 7 x weekly - 2 sets - 10 reps - 3 seconds hold -  Supine Knee Extension Strengthening  - 2 x daily - 7 x weekly - 2 sets - 10 reps - 5 seconds hold - Sit to Stand  - 2 x daily - 7 x weekly - 2 sets - 10 reps  ASSESSMENT:  CLINICAL IMPRESSION: Some tightness increased today but improved c mobility in clinic. Compliant surface static balance still needs improvement for progression to dynamic stability.  Strength testing improved mildly.  Both could improve for quad strength.  Continued skilled PT services indicated at this time.   OBJECTIVE IMPAIRMENTS: decreased mobility, difficulty walking, decreased ROM, decreased strength, increased edema, and pain.   ACTIVITY LIMITATIONS: bending, standing, squatting, and sleeping  PARTICIPATION LIMITATIONS: shopping and community activity  PERSONAL FACTORS: 3+ comorbidities: see PMH are also affecting patient's functional outcome.   REHAB POTENTIAL: Good  CLINICAL DECISION MAKING: Stable/uncomplicated  EVALUATION COMPLEXITY: Low   GOALS: Goals reviewed with patient? Yes  SHORT TERM GOALS: (target date for Short term goals are 3 weeks 06/21/2024)   1.  Patient will demonstrate independent use of home exercise program to maintain progress from in clinic treatments.  Goal status: Met  LONG TERM GOALS: (target dates for all long term goals are 10 weeks  08/09/2024 )   1. Patient will demonstrate/report pain at worst less than or equal to 2/10 to facilitate minimal limitation in daily activity secondary to pain symptoms.  Goal status: on going 06/23/2024   2. Patient  will demonstrate independent use of home exercise program to facilitate ability to maintain/progress functional gains from skilled physical therapy services.  Goal status: on going 06/23/2024   3. Patient will demonstrate Patient specific functional scale avg > or = 5.5  to indicate reduced disability due to condition.   Goal status: on going 06/23/2024   4.  Patient will demonstrate left LE MMT by >/= 5 pounds throughout to faciltiate usual transfers, stairs, squatting at PLOF for daily life.   Goal status: on going 06/23/2024   5.  Patient will demonstrate up and down stairs with single hand rail with reciprocal gait pattern.  Goal status: on going 06/23/2024   6.  Pt will be able to walk 1/2 mile with pain in her left knee of </= 2/10 for neighborhood walking with inclines and declines.  Goal status: on going 06/23/2024      PLAN:  PT FREQUENCY: 1-2x/week  PT DURATION: 10 weeks  PLANNED INTERVENTIONS: Can include 02853- PT Re-evaluation, 97110-Therapeutic exercises, 97530- Therapeutic activity, 97112- Neuromuscular re-education, 97535- Self Care, 97140- Manual therapy, [phone]- Gait training, [phone]- Orthotic Fit/training, [phone]- Canalith repositioning, [id number]- Aquatic Therapy, [phone]- Electrical stimulation (unattended), [id number] Physical performance testing, 97016- Vasopneumatic device, [id number]- Ultrasound, [id number]- Traction (mechanical), [id number]- Ionotophoresis 4mg /ml Dexamethasone ,  79439 - Needle insertion w/o injection 1 or 2 muscles, 20561 - Needle insertion w/o injection 3 or more muscles.   Patient/Family education, Balance training, Stair training, Taping, Dry Needling, Joint mobilization, Joint manipulation, Spinal manipulation, Spinal mobilization, Scar mobilization, Vestibular training, Visual/preceptual remediation/compensation, DME instructions, Cryotherapy, and Moist heat.  All performed as medically necessary.  All included unless contraindicated  PLAN FOR NEXT SESSION: Strength gains for  bilateral.  Balance improvements.    Ozell Silvan, PT, DPT, OCS, ATC 06/23/24  11:38 AM

## 2024-06-27 ENCOUNTER — Ambulatory Visit

## 2024-06-27 DIAGNOSIS — M25562 Pain in left knee: Secondary | ICD-10-CM

## 2024-06-27 DIAGNOSIS — M6281 Muscle weakness (generalized): Secondary | ICD-10-CM | POA: Diagnosis not present

## 2024-06-27 DIAGNOSIS — R262 Difficulty in walking, not elsewhere classified: Secondary | ICD-10-CM | POA: Diagnosis not present

## 2024-06-27 DIAGNOSIS — R600 Localized edema: Secondary | ICD-10-CM

## 2024-06-27 NOTE — Therapy (Signed)
 OUTPATIENT PHYSICAL THERAPY TREATMENT   Patient Name: Madison Mosley MRN: [id number] DOB:[birth year], 73 y.o., female Today's Date: 06/27/2024  END OF SESSION:  PT End of Session - 06/27/24 0929     Visit Number 6    Number of Visits 20    Date for PT Re-Evaluation 08/12/24    Authorization Type UHC/ Medicare    Authorization Time Period 05/31/2024 - 07/26/2024    Authorization - Visit Number 6    Authorization - Number of Visits 16    Progress Note Due on Visit 10    PT Start Time 0929    PT Stop Time 1013    PT Time Calculation (min) 44 min    Activity Tolerance Patient tolerated treatment well    Behavior During Therapy WFL for tasks assessed/performed               Past Medical History:  Diagnosis Date   Arthritis    CAD (coronary artery disease)    stents   GERD (gastroesophageal reflux disease)    Headache    Hyperlipidemia    Hypertension    Past Surgical History:  Procedure Laterality Date   CARDIAC CATHETERIZATION  11/18/1999   Percutaneous revascularization precedure with angioplasty to the proximal LAD and first diagonal    CESAREAN SECTION     CHOLECYSTECTOMY     CORONARY STENT PLACEMENT     KNEE ARTHROSCOPY WITH MEDIAL MENISECTOMY Left 05/06/2024   Procedure: LEFT KNEE ARTHROSCOPY WITH MEDIAL MENISCECTOMY;  Surgeon: Harden Jerona GAILS, MD;  Location: [facility] OR;  Service: Orthopedics;  Laterality: Left;   PARTIAL HYSTERECTOMY     TONSILLECTOMY  11/17/1958   Patient Active Problem List   Diagnosis Date Noted   Osteochondral defect of femoral condyle 05/06/2024   Right knee meniscal tear 10/02/2020   Unilateral primary osteoarthritis, right knee 04/25/2020   Low back pain 03/21/2020   Statin myopathy 11/16/2019   Atrophic vaginitis 06/18/2017   Sprain of calcaneofibular ligament of left ankle 01/16/2017   Obesity (BMI 30.0-34.9) 01/05/2015   CAD S/P percutaneous coronary angioplasty 04/21/2013   Hypercholesterolemia 04/21/2013   Essential hypertension  04/21/2013    PCP: Clarice Nottingham, MD   REFERRING PROVIDER: Harden Jerona GAILS, MD   REFERRING DIAG:  Diagnosis  [phone] (ICD-10-CM) - S/P left knee arthroscopy  M95.8 (ICD-10-CM) - Osteochondral defect of femoral condyle  M17.12 (ICD-10-CM) - Primary osteoarthritis of left knee    THERAPY DIAG:  Acute pain of left knee  Localized edema  Difficulty in walking, not elsewhere classified  Muscle weakness (generalized)  Rationale for Evaluation and Treatment: Rehabilitation  ONSET DATE: 05/06/24  SUBJECTIVE:   SUBJECTIVE STATEMENT: I just keep wondering when I'm going to wake up without pain. Pain rated at 3/10 this morning.  Dr. Harden discharged patient after follow up on 06/23/2024.   PERTINENT HISTORY: Cardiac cath, left knee arthroscopy 05/06/24   PAIN:  NPRS scale: 3.5/10 Pain location: left knee Pain description: achy, throbbing with walking  Aggravating factors: walking, lying on her Rt side Relieving factors: changing positions, resting, ice  PRECAUTIONS: None  WEIGHT BEARING RESTRICTIONS: No  FALLS:  Has patient fallen in last 6 months? No  LIVING ENVIRONMENT: Lives with: lives with their family and lives with their spouse Lives in: House/apartment Stairs: Yes: External: 3 steps; none Has following equipment at home: Single point cane  OCCUPATION: retired  PLOF: Independent  PATIENT GOALS: walk without cane, stop hurting  Next MD visit:   OBJECTIVE:  DIAGNOSTIC FINDINGS: IMPRESSION: 04/08/24 Tricompartmental osteoarthrosis. Mild to moderate chondromalacia with moderate reactive joint effusion.   Moderate radial tear at the root of the medial meniscus with medial displacement of body. There is second likely radial tear at the junction the body anterior horn of the medial meniscus. See above for more detail.  PATIENT SURVEYS:  Patient-Specific Activity Scoring Scheme  0 represents "unable to perform." 10 represents "able to perform at  prior level. 0 1 2 3 4 5 6 7 8 9  10 (Date and Score)   Activity Eval  05/31/24    1. Walk for exercise  4    2. Sit comfortably 4     3. steps 2   4. Standing from sitting 4   5.    Score 3.5    Total score = sum of the activity scores/number of activities Minimum detectable change (90%CI) for average score = 2 points Minimum detectable change (90%CI) for single activity score = 3 points  COGNITION: 05/31/24 Overall cognitive status: WFL    SENSATION: 05/31/24 WFL  EDEMA:  05/31/24 Circumferential: Rt:   48.0 centimeters       Left:  51.5 centimeters   LOWER EXTREMITY ROM:   ROM Right Eval 05/31/24 Left Eval 05/31/24 Left 06/20/24  Hip flexion     Hip extension     Hip abduction     Hip adduction     Hip internal rotation     Hip external rotation     Knee flexion 118 95 108  Knee extension -4 0 0  Ankle dorsiflexion     Ankle plantarflexion     Ankle inversion     Ankle eversion      (Blank rows = not tested)  LOWER EXTREMITY MMT:  MMT Right Eval 05/31/24 Sitting HHD Left Eval 05/31/24 Sitting HHD Left 06/23/2024  Hip flexion 30.7 20.1   Hip extension     Hip abduction     Hip adduction     Hip internal rotation     Hip external rotation     Knee flexion 28.5 21.1   Knee extension 33.3 22.9 30, 31 lbs  Ankle dorsiflexion     Ankle plantarflexion     Ankle inversion     Ankle eversion      (Blank rows = not tested)  GAIT: 05/31/24 Distance walked: clinic distance Assistive device utilized: None, cane for community amb and uneven surfaces and stairs Level of assistance: Complete Independence to modified independent Comments: antalgic gait                                                                                                                                                                        TODAY'S TREATMENT  DATE: 06/23/2024 TherEx:  UBE LE only fwd, seat 11,  level 1 for 8 minutes  Slant board gastroc stretch 3x30s   TherAct:  Leg Press bilat LE 2x10 with 87# (slow eccentric)  Leg press single LE 2x10 with 37# (slow eccentric)  Step ups with 6 step with intermittent use of UE; 2x10 each leg   Neuro Re-Ed:  Narrow BOS on foam pad x1 minute with SBA  Narrow BOS on foam pad with head turns (up/down, left/right) 1x10 each direction; increased ankle sway but no increased need for assist and no LOB  Tandem stance while catching and tossing ball to challenge reactional balance 1x30s each leg back; multiple instances of lateral LOB though patient able to self correct with UE use on parallel bars   TODAY'S TREATMENT                                                                          DATE: 06/23/2024 Therex: UBE LE fwd for ROM, seat 12  -   lvl 1.0 8 mins Seated Lt knee LAQ with end range pauses in each direction for mobility gains x 10  Supine Lt knee LAQ in 90 deg hip flexion for mobility/swelling reduction x 10  Discussed sitting knee flexion c Rt leg overpressure stretch as well for flexion gains.    TherActivity Leg press double leg (quick up, slow eccentric lowering) x 15 75 lbs,  single leg 31 lbs x 15 - performed bilaterally    Neuro Re-ed Feet close together on airex foam 1 min x 1 with SBA Feet close together with head turns Lt and Rt x 10 each way with SBA, look up /down x 10 Feet close together PF/DF alternating x 15 each way on foam with SBA to min A at times.     TODAY'S TREATMENT                                                                          DATE: 06/20/24:  TherEx:  Nustep: level 5 x 8 minutes UE/LE Seated SLR: 2# 2 x 10  Seated LAQ: 3# 3 x 10 holding 2-3 sec  Supine bridge: 2 x 10  Sit to supine c assistance of 2 to help prevent increase in pain in pt's Rt side Supine to sit: log roll  TherActivites:  Sit to stand x 10  NMR:  Standing on Airex mat: feet apart eyes open x 1 minute Standing on Airex mat: feet  together eyes open x 1 minute with HHA Standing on Airex mat: staggered stance bil x 1 minute each c HHA Manual  PROM of left knee, flexion/extension in supine  Seated: IR and distraction with flexion in sitting    TODAY'S TREATMENT  DATE:06/13/24 [id number] Nustep Level 5 8 min no arms Incline gastroc bilateral 30 sec x 3  Seated quad set with SLR Lt x 10  Seated LAQ x 15, tailgate flexion education and performance 1 min Seated Lt leg hamstring set 5 sec hold x 5 (education for use on recliner Seated Alt knee flex/ opposite knee ext Seated hip flexion 20x  Manual - Prom L knee  Cold pack 5 minutes in seated   PATIENT EDUCATION:  Eval: Education details: HEP, POC Person educated: Patient Education method: Programmer, multimedia, Facilities manager, Verbal cues, and Handouts Education comprehension: verbalized understanding, returned demonstration, and verbal cues required  HOME EXERCISE PROGRAM: Access Code: [email or web address] URL: [email or web address] Date: 05/31/2024 Prepared by: Delon Lunger  Exercises - Supine Bridge  - 2 x daily - 7 x weekly - 2 sets - 10 reps - 5 seconds hold - Supine Heel Slide with Strap  - 2 x daily - 7 x weekly - 2 sets - 10 reps - 3 seconds hold - Supine Knee Extension Strengthening  - 2 x daily - 7 x weekly - 2 sets - 10 reps - 5 seconds hold - Sit to Stand  - 2 x daily - 7 x weekly - 2 sets - 10 reps  ASSESSMENT:  CLINICAL IMPRESSION: Patient arrived to session noting no change in symptoms. Patient endorsed follow up with MD went well and she has been discharged from his care unless symptoms were to change/worsen. Patient tolerated all activities this date, but will benefit from continued balance training. Patient will continue to benefit from skilled PT.    OBJECTIVE IMPAIRMENTS: decreased mobility, difficulty walking, decreased ROM, decreased strength, increased edema, and pain.    ACTIVITY LIMITATIONS: bending, standing, squatting, and sleeping  PARTICIPATION LIMITATIONS: shopping and community activity  PERSONAL FACTORS: 3+ comorbidities: see PMH are also affecting patient's functional outcome.   REHAB POTENTIAL: Good  CLINICAL DECISION MAKING: Stable/uncomplicated  EVALUATION COMPLEXITY: Low   GOALS: Goals reviewed with patient? Yes  SHORT TERM GOALS: (target date for Short term goals are 3 weeks 06/21/2024)   1.  Patient will demonstrate independent use of home exercise program to maintain progress from in clinic treatments.  Goal status: Met  LONG TERM GOALS: (target dates for all long term goals are 10 weeks  08/09/2024 )   1. Patient will demonstrate/report pain at worst less than or equal to 2/10 to facilitate minimal limitation in daily activity secondary to pain symptoms.  Goal status: on going 06/23/2024   2. Patient will demonstrate independent use of home exercise program to facilitate ability to maintain/progress functional gains from skilled physical therapy services.  Goal status: on going 06/23/2024   3. Patient will demonstrate Patient specific functional scale avg > or = 5.5  to indicate reduced disability due to condition.   Goal status: on going 06/23/2024   4.  Patient will demonstrate left LE MMT by >/= 5 pounds throughout to faciltiate usual transfers, stairs, squatting at PLOF for daily life.   Goal status: on going 06/23/2024   5.  Patient will demonstrate up and down stairs with single hand rail with reciprocal gait pattern.  Goal status: on going 06/23/2024   6.  Pt will be able to walk 1/2 mile with pain in her left knee of </= 2/10 for neighborhood walking with inclines and declines.  Goal status: on going 06/23/2024      PLAN:  PT FREQUENCY: 1-2x/week  PT DURATION: 10 weeks  PLANNED INTERVENTIONS: Can include 02853- PT  Re-evaluation, 97110-Therapeutic exercises, 97530- Therapeutic activity, [id number]- Neuromuscular  re-education, [id number]- Self Care, 02859- Manual therapy, [phone]- Gait training, [phone]- Orthotic Fit/training, [phone]- Canalith repositioning, [id number]- Aquatic Therapy, [phone]- Electrical stimulation (unattended), [id number] Physical performance testing, [id number]- Vasopneumatic device, [id number]- Ultrasound, [id number]- Traction (mechanical), [id number]- Ionotophoresis 4mg /ml Dexamethasone ,  [id number] - Needle insertion w/o injection 1 or 2 muscles, [id number] - Needle insertion w/o injection 3 or more muscles.   Patient/Family education, Balance training, Stair training, Taping, Dry Needling, Joint mobilization, Joint manipulation, Spinal manipulation, Spinal mobilization, Scar mobilization, Vestibular training, Visual/preceptual remediation/compensation, DME instructions, Cryotherapy, and Moist heat.  All performed as medically necessary.  All included unless contraindicated  PLAN FOR NEXT SESSION: Strength gains for bilateral.  Balance improvements.    Susannah Daring, PT, DPT 06/27/24 10:21 AM

## 2024-06-28 NOTE — Therapy (Signed)
 OUTPATIENT PHYSICAL THERAPY TREATMENT   Patient Name: Madison Mosley MRN: [id number] DOB:Dec 22, 1950, 73 y.o., female Today's Date: 06/29/2024  END OF SESSION:  PT End of Session - 06/29/24 0943     Visit Number 7    Number of Visits 20    Date for PT Re-Evaluation 08/12/24    Authorization Type UHC/ Medicare    Authorization Time Period 05/31/2024 - 07/26/2024    Authorization - Number of Visits 16    Progress Note Due on Visit 10    PT Start Time 0935    PT Stop Time 1013    PT Time Calculation (min) 38 min    Equipment Utilized During Treatment Gait belt    Activity Tolerance Patient tolerated treatment well    Behavior During Therapy WFL for tasks assessed/performed                Past Medical History:  Diagnosis Date   Arthritis    CAD (coronary artery disease)    stents   GERD (gastroesophageal reflux disease)    Headache    Hyperlipidemia    Hypertension    Past Surgical History:  Procedure Laterality Date   CARDIAC CATHETERIZATION  11/18/1999   Percutaneous revascularization precedure with angioplasty to the proximal LAD and first diagonal    CESAREAN SECTION     CHOLECYSTECTOMY     CORONARY STENT PLACEMENT     KNEE ARTHROSCOPY WITH MEDIAL MENISECTOMY Left 05/06/2024   Procedure: LEFT KNEE ARTHROSCOPY WITH MEDIAL MENISCECTOMY;  Surgeon: Harden Jerona GAILS, MD;  Location: [facility] OR;  Service: Orthopedics;  Laterality: Left;   PARTIAL HYSTERECTOMY     TONSILLECTOMY  11/17/1958   Patient Active Problem List   Diagnosis Date Noted   Osteochondral defect of femoral condyle 05/06/2024   Right knee meniscal tear 10/02/2020   Unilateral primary osteoarthritis, right knee 04/25/2020   Low back pain 03/21/2020   Statin myopathy 11/16/2019   Atrophic vaginitis 06/18/2017   Sprain of calcaneofibular ligament of left ankle 01/16/2017   Obesity (BMI 30.0-34.9) 01/05/2015   CAD S/P percutaneous coronary angioplasty 04/21/2013   Hypercholesterolemia 04/21/2013    Essential hypertension 04/21/2013    PCP: Clarice Nottingham, MD   REFERRING PROVIDER: Harden Jerona GAILS, MD   REFERRING DIAG:  Diagnosis  [phone] (ICD-10-CM) - S/P left knee arthroscopy  M95.8 (ICD-10-CM) - Osteochondral defect of femoral condyle  M17.12 (ICD-10-CM) - Primary osteoarthritis of left knee    THERAPY DIAG:  Acute pain of left knee  Localized edema  Difficulty in walking, not elsewhere classified  Muscle weakness (generalized)  Rationale for Evaluation and Treatment: Rehabilitation  ONSET DATE: 05/06/24  SUBJECTIVE:   SUBJECTIVE STATEMENT: Pt reports feeling more sore after increased pushing into flexion with the recliner (repeatedly) as an exercise.  States she feels more stiff affecting her walking.    PERTINENT HISTORY: Cardiac cath, left knee arthroscopy 05/06/24   PAIN:  NPRS scale: 4.5/10 Pain location: left knee Pain description: achy, throbbing with walking  Aggravating factors: walking, lying on her Rt side Relieving factors: changing positions, resting, ice  PRECAUTIONS: None  WEIGHT BEARING RESTRICTIONS: No  FALLS:  Has patient fallen in last 6 months? No  LIVING ENVIRONMENT: Lives with: lives with their family and lives with their spouse Lives in: House/apartment Stairs: Yes: External: 3 steps; none Has following equipment at home: Single point cane  OCCUPATION: retired  PLOF: Independent  PATIENT GOALS: walk without cane, stop hurting  Next MD visit:   OBJECTIVE:  DIAGNOSTIC FINDINGS: IMPRESSION: 04/08/24 Tricompartmental osteoarthrosis. Mild to moderate chondromalacia with moderate reactive joint effusion.   Moderate radial tear at the root of the medial meniscus with medial displacement of body. There is second likely radial tear at the junction the body anterior horn of the medial meniscus. See above for more detail.  PATIENT SURVEYS:  Patient-Specific Activity Scoring Scheme  0 represents "unable to perform." 10  represents "able to perform at prior level. 0 1 2 3 4 5 6 7 8 9  10 (Date and Score)   Activity Eval  05/31/24    1. Walk for exercise  4    2. Sit comfortably 4     3. steps 2   4. Standing from sitting 4   5.    Score 3.5    Total score = sum of the activity scores/number of activities Minimum detectable change (90%CI) for average score = 2 points Minimum detectable change (90%CI) for single activity score = 3 points  COGNITION: 05/31/24 Overall cognitive status: WFL    SENSATION: 05/31/24 WFL  EDEMA:  05/31/24 Circumferential: Rt:   48.0 centimeters       Left:  51.5 centimeters   LOWER EXTREMITY ROM:   ROM Right Eval 05/31/24 Left Eval 05/31/24 Left 06/20/24  Hip flexion     Hip extension     Hip abduction     Hip adduction     Hip internal rotation     Hip external rotation     Knee flexion 118 95 108  Knee extension -4 0 0  Ankle dorsiflexion     Ankle plantarflexion     Ankle inversion     Ankle eversion      (Blank rows = not tested)  LOWER EXTREMITY MMT:  MMT Right Eval 05/31/24 Sitting HHD Left Eval 05/31/24 Sitting HHD Left 06/23/2024  Hip flexion 30.7 20.1   Hip extension     Hip abduction     Hip adduction     Hip internal rotation     Hip external rotation     Knee flexion 28.5 21.1   Knee extension 33.3 22.9 30, 31 lbs  Ankle dorsiflexion     Ankle plantarflexion     Ankle inversion     Ankle eversion      (Blank rows = not tested)  GAIT: 05/31/24 Distance walked: clinic distance Assistive device utilized: None, cane for community amb and uneven surfaces and stairs Level of assistance: Complete Independence to modified independent Comments: antalgic gait                                                                                                                                                                        TODAY'S TREATMENT  DATE:   06/29/24 TherEx:  Nu Step level  for 8 minutes  Slant board gastroc stretch 3x30s  SAQ with 2# weight   TherAct:  Leg Press bilat LE 2x10 with 87# (slow eccentric)  Leg press single LE 2x10 with 37# (slow eccentric)  Step ups with 6 step with intermittent use of UE; 2x10 each leg   Neuro Re-Ed:  Narrow BOS on foam pad x1 minute with SBA  Narrow BOS on foam pad with head turns  Taps onto 8 inch step with each LE for strength and balance   06/23/2024 TherEx:  UBE LE only fwd, seat 11, level 1 for 8 minutes  Slant board gastroc stretch 3x30s   TherAct:  Leg Press bilat LE 2x10 with 87# (slow eccentric)  Leg press single LE 2x10 with 37# (slow eccentric)  Step ups with 6 step with intermittent use of UE; 2x10 each leg   Neuro Re-Ed:  Narrow BOS on foam pad x1 minute with SBA  Narrow BOS on foam pad with head turns (up/down, left/right) 1x10 each direction; increased ankle sway but no increased need for assist and no LOB  Tandem stance while catching and tossing ball to challenge reactional balance 1x30s each leg back; multiple instances of lateral LOB though patient able to self correct with UE use on parallel bars   TODAY'S TREATMENT                                                                          DATE: 06/23/2024 Therex: UBE LE fwd for ROM, seat 12  -   lvl 1.0 8 mins Seated Lt knee LAQ with end range pauses in each direction for mobility gains x 10  Supine Lt knee LAQ in 90 deg hip flexion for mobility/swelling reduction x 10  Discussed sitting knee flexion c Rt leg overpressure stretch as well for flexion gains.    TherActivity Leg press double leg (quick up, slow eccentric lowering) x 15 75 lbs,  single leg 31 lbs x 15 - performed bilaterally    Neuro Re-ed Feet close together on airex foam 1 min x 1 with SBA Feet close together with head turns Lt and Rt x 10 each way with SBA, look up /down x 10 Feet close together PF/DF alternating x 15 each way on foam with  SBA to min A at times.     TODAY'S TREATMENT                                                                          DATE: 06/20/24:  TherEx:  Nustep: level 5 x 8 minutes UE/LE Seated SLR: 2# 2 x 10  Seated LAQ: 3# 3 x 10 holding 2-3 sec  Supine bridge: 2 x 10  Sit to supine c assistance of 2 to help prevent increase in pain in pt's Rt side Supine to sit: log roll  TherActivites:  Sit to stand x 10  NMR:  Standing on Airex mat: feet apart eyes open x 1 minute Standing on Airex mat: feet together eyes open x 1 minute with HHA Standing on Airex mat: staggered stance bil x 1 minute each c HHA Manual  PROM of left knee, flexion/extension in supine  Seated: IR and distraction with flexion in sitting    TODAY'S TREATMENT                                                                          DATE:06/13/24 [id number] Nustep Level 5 8 min no arms Incline gastroc bilateral 30 sec x 3  Seated quad set with SLR Lt x 10  Seated LAQ x 15, tailgate flexion education and performance 1 min Seated Lt leg hamstring set 5 sec hold x 5 (education for use on recliner Seated Alt knee flex/ opposite knee ext Seated hip flexion 20x  Manual - Prom L knee  Cold pack 5 minutes in seated   PATIENT EDUCATION:  Eval: Education details: HEP, POC Person educated: Patient Education method: Programmer, multimedia, Facilities manager, Verbal cues, and Handouts Education comprehension: verbalized understanding, returned demonstration, and verbal cues required  HOME EXERCISE PROGRAM: Access Code: [email or web address] URL: [email or web address] Date: 05/31/2024 Prepared by: Delon Lunger  Exercises - Supine Bridge  - 2 x daily - 7 x weekly - 2 sets - 10 reps - 5 seconds hold - Supine Heel Slide with Strap  - 2 x daily - 7 x weekly - 2 sets - 10 reps - 3 seconds hold - Supine Knee Extension Strengthening  - 2 x daily - 7 x weekly - 2 sets - 10 reps - 5 seconds hold - Sit to Stand  - 2 x daily - 7 x weekly - 2 sets -  10 reps  ASSESSMENT:  CLINICAL IMPRESSION: Patient needed some assist from bars with foam balance exercises due to increased sway.    OBJECTIVE IMPAIRMENTS: decreased mobility, difficulty walking, decreased ROM, decreased strength, increased edema, and pain.   ACTIVITY LIMITATIONS: bending, standing, squatting, and sleeping  PARTICIPATION LIMITATIONS: shopping and community activity  PERSONAL FACTORS: 3+ comorbidities: see PMH are also affecting patient's functional outcome.   REHAB POTENTIAL: Good  CLINICAL DECISION MAKING: Stable/uncomplicated  EVALUATION COMPLEXITY: Low   GOALS: Goals reviewed with patient? Yes  SHORT TERM GOALS: (target date for Short term goals are 3 weeks 06/21/2024)   1.  Patient will demonstrate independent use of home exercise program to maintain progress from in clinic treatments.  Goal status: Met  LONG TERM GOALS: (target dates for all long term goals are 10 weeks  08/09/2024 )   1. Patient will demonstrate/report pain at worst less than or equal to 2/10 to facilitate minimal limitation in daily activity secondary to pain symptoms.  Goal status: on going 06/23/2024   2. Patient will demonstrate independent use of home exercise program to facilitate ability to maintain/progress functional gains from skilled physical therapy services.  Goal status: on going 06/23/2024   3. Patient will demonstrate Patient specific functional scale avg > or = 5.5  to indicate reduced disability due to condition.   Goal status: on going 06/23/2024   4.  Patient will demonstrate left LE MMT by >/= 5 pounds throughout to faciltiate usual  transfers, stairs, squatting at [facility] for daily life.   Goal status: on going 06/23/2024   5.  Patient will demonstrate up and down stairs with single hand rail with reciprocal gait pattern.  Goal status: on going 06/23/2024   6.  Pt will be able to walk 1/2 mile with pain in her left knee of </= 2/10 for neighborhood walking with  inclines and declines.  Goal status: on going 06/23/2024      PLAN:  PT FREQUENCY: 1-2x/week  PT DURATION: 10 weeks  PLANNED INTERVENTIONS: Can include 02853- PT Re-evaluation, 97110-Therapeutic exercises, 97530- Therapeutic activity, 97112- Neuromuscular re-education, 97535- Self Care, 97140- Manual therapy, [phone]- Gait training, [phone]- Orthotic Fit/training, [phone]- Canalith repositioning, [id number]- Aquatic Therapy, [phone]- Electrical stimulation (unattended), [id number] Physical performance testing, 97016- Vasopneumatic device, [id number]- Ultrasound, [id number]- Traction (mechanical), [id number]- Ionotophoresis 4mg /ml Dexamethasone ,  79439 - Needle insertion w/o injection 1 or 2 muscles, 20561 - Needle insertion w/o injection 3 or more muscles.   Patient/Family education, Balance training, Stair training, Taping, Dry Needling, Joint mobilization, Joint manipulation, Spinal manipulation, Spinal mobilization, Scar mobilization, Vestibular training, Visual/preceptual remediation/compensation, DME instructions, Cryotherapy, and Moist heat.  All performed as medically necessary.  All included unless contraindicated  PLAN FOR NEXT SESSION: Continue with balance and qiuadricep strengthening.  Burnard Meth, PT 06/29/24  11:10 AM

## 2024-06-29 ENCOUNTER — Ambulatory Visit

## 2024-06-29 ENCOUNTER — Encounter: Payer: Self-pay | Admitting: Orthopedic Surgery

## 2024-06-29 DIAGNOSIS — M6281 Muscle weakness (generalized): Secondary | ICD-10-CM

## 2024-06-29 DIAGNOSIS — M25562 Pain in left knee: Secondary | ICD-10-CM

## 2024-06-29 DIAGNOSIS — R262 Difficulty in walking, not elsewhere classified: Secondary | ICD-10-CM

## 2024-06-29 DIAGNOSIS — R600 Localized edema: Secondary | ICD-10-CM | POA: Diagnosis not present

## 2024-06-29 NOTE — Progress Notes (Signed)
 Office Visit Note   Patient: Madison Mosley           Date of Birth: 02-13-1951           MRN: [id number] Visit Date: 06/23/2024              Requested by: Clarice Nottingham, MD [address] [place],  [place] [address] PCP: Clarice Nottingham, MD  Chief Complaint  Patient presents with   Left Knee - Routine Post Op    04/2024 left knee scope       HPI: Patient is a 73 year old woman who is 6 weeks status post left knee arthroscopy.  She states she feels like she is healing slowly.  She does have stiffness and tenderness.  She is currently doing physical therapy 2 times a week.  She states that her range of motion is improving.  Assessment & Plan: Visit Diagnoses:  1. S/P left knee arthroscopy     Plan: Patient will continue with physical therapy and strengthening.  Follow-Up Instructions: Return in about 4 weeks (around 07/21/2024).   Ortho Exam  Patient is alert, oriented, no adenopathy, well-dressed, normal affect, normal respiratory effort. Patient's has no effusion no redness or cellulitis.  She states that her flexion is improved from 95 to 108 degrees.    Imaging: No results found. No images are attached to the encounter.  Labs: No results found for: HGBA1C, ESRSEDRATE, CRP, LABURIC, REPTSTATUS, GRAMSTAIN, CULT, LABORGA   Lab Results  Component Value Date   ALBUMIN 3.2 (L) 05/06/2024    No results found for: MG No results found for: VD25OH  No results found for: PREALBUMIN    Latest Ref Rng & Units 05/06/2024    7:10 AM  CBC EXTENDED  WBC 4.0 - 10.5 K/uL 8.2   RBC 3.87 - 5.11 MIL/uL 4.01   Hemoglobin 12.0 - 15.0 g/dL 87.8   HCT 63.9 - 53.9 % 37.9   Platelets 150 - 400 K/uL 250   NEUT# 1.7 - 7.7 K/uL 5.3   Lymph# 0.7 - 4.0 K/uL 2.2      There is no height or weight on file to calculate BMI.  Orders:  No orders of the defined types were placed in this encounter.  Meds ordered this encounter  Medications    HYDROcodone -acetaminophen  (NORCO/VICODIN) 5-325 MG tablet    Sig: Take 1 tablet by mouth every 6 (six) hours as needed for moderate pain (pain score 4-6).    Dispense:  30 tablet    Refill:  0     Procedures: No procedures performed  Clinical Data: No additional findings.  ROS:  All other systems negative, except as noted in the HPI. Review of Systems  Objective: Vital Signs: There were no vitals taken for this visit.  Specialty Comments:  No specialty comments available.  PMFS History: Patient Active Problem List   Diagnosis Date Noted   Osteochondral defect of femoral condyle 05/06/2024   Right knee meniscal tear 10/02/2020   Unilateral primary osteoarthritis, right knee 04/25/2020   Low back pain 03/21/2020   Statin myopathy 11/16/2019   Atrophic vaginitis 06/18/2017   Sprain of calcaneofibular ligament of left ankle 01/16/2017   Obesity (BMI 30.0-34.9) 01/05/2015   CAD S/P percutaneous coronary angioplasty 04/21/2013   Hypercholesterolemia 04/21/2013   Essential hypertension 04/21/2013   Past Medical History:  Diagnosis Date   Arthritis    CAD (coronary artery disease)    stents   GERD (gastroesophageal reflux disease)  Headache    Hyperlipidemia    Hypertension     Family History  Problem Relation Age of Onset   COPD Mother    Heart failure Mother    Diabetes Mother    Valvular heart disease Mother        mitral valve leakage   Heart attack Father 70       multiple heart attacks   Heart attack Sister 58   CAD Brother 11   Heart attack Maternal Grandfather    Heart attack Paternal Grandfather        multiple hearts attacks   Stroke Paternal Grandfather     Past Surgical History:  Procedure Laterality Date   CARDIAC CATHETERIZATION  11/18/1999   Percutaneous revascularization precedure with angioplasty to the proximal LAD and first diagonal    CESAREAN SECTION     CHOLECYSTECTOMY     CORONARY STENT PLACEMENT     KNEE ARTHROSCOPY WITH MEDIAL  MENISECTOMY Left 05/06/2024   Procedure: LEFT KNEE ARTHROSCOPY WITH MEDIAL MENISCECTOMY;  Surgeon: Harden Jerona GAILS, MD;  Location: [facility] OR;  Service: Orthopedics;  Laterality: Left;   PARTIAL HYSTERECTOMY     TONSILLECTOMY  11/17/1958   Social History   Occupational History   Not on file  Tobacco Use   Smoking status: Never   Smokeless tobacco: Never  Vaping Use   Vaping status: Never Used  Substance and Sexual Activity   Alcohol use: No    Alcohol/week: 0.0 standard drinks of alcohol   Drug use: No   Sexual activity: Not on file

## 2024-07-04 ENCOUNTER — Ambulatory Visit (INDEPENDENT_AMBULATORY_CARE_PROVIDER_SITE_OTHER): Admitting: Rehabilitative and Restorative Service Providers"

## 2024-07-04 ENCOUNTER — Encounter: Payer: Self-pay | Admitting: Rehabilitative and Restorative Service Providers"

## 2024-07-04 DIAGNOSIS — M25562 Pain in left knee: Secondary | ICD-10-CM

## 2024-07-04 DIAGNOSIS — R600 Localized edema: Secondary | ICD-10-CM

## 2024-07-04 DIAGNOSIS — M6281 Muscle weakness (generalized): Secondary | ICD-10-CM | POA: Diagnosis not present

## 2024-07-04 DIAGNOSIS — R262 Difficulty in walking, not elsewhere classified: Secondary | ICD-10-CM | POA: Diagnosis not present

## 2024-07-04 NOTE — Therapy (Signed)
 OUTPATIENT PHYSICAL THERAPY TREATMENT   Patient Name: Madison Mosley MRN: [id number] DOB:[birth year], 73 y.o., female Today's Date: 07/04/2024  END OF SESSION:  PT End of Session - 07/04/24 0928     Visit Number 8    Number of Visits 20    Date for PT Re-Evaluation 08/12/24    Authorization Type UHC/ Medicare    Authorization Time Period 05/31/2024 - 07/26/2024    Authorization - Visit Number 8    Authorization - Number of Visits 16    Progress Note Due on Visit 10    PT Start Time 0928    PT Stop Time 1008    PT Time Calculation (min) 40 min    Equipment Utilized During Treatment Gait belt    Activity Tolerance Patient tolerated treatment well    Behavior During Therapy WFL for tasks assessed/performed                 Past Medical History:  Diagnosis Date   Arthritis    CAD (coronary artery disease)    stents   GERD (gastroesophageal reflux disease)    Headache    Hyperlipidemia    Hypertension    Past Surgical History:  Procedure Laterality Date   CARDIAC CATHETERIZATION  11/18/1999   Percutaneous revascularization precedure with angioplasty to the proximal LAD and first diagonal    CESAREAN SECTION     CHOLECYSTECTOMY     CORONARY STENT PLACEMENT     KNEE ARTHROSCOPY WITH MEDIAL MENISECTOMY Left 05/06/2024   Procedure: LEFT KNEE ARTHROSCOPY WITH MEDIAL MENISCECTOMY;  Surgeon: Harden Jerona GAILS, MD;  Location: [facility] OR;  Service: Orthopedics;  Laterality: Left;   PARTIAL HYSTERECTOMY     TONSILLECTOMY  11/17/1958   Patient Active Problem List   Diagnosis Date Noted   Osteochondral defect of femoral condyle 05/06/2024   Right knee meniscal tear 10/02/2020   Unilateral primary osteoarthritis, right knee 04/25/2020   Low back pain 03/21/2020   Statin myopathy 11/16/2019   Atrophic vaginitis 06/18/2017   Sprain of calcaneofibular ligament of left ankle 01/16/2017   Obesity (BMI 30.0-34.9) 01/05/2015   CAD S/P percutaneous coronary angioplasty 04/21/2013    Hypercholesterolemia 04/21/2013   Essential hypertension 04/21/2013    PCP: Clarice Nottingham, MD   REFERRING PROVIDER: Harden Jerona GAILS, MD   REFERRING DIAG:  Diagnosis  [phone] (ICD-10-CM) - S/P left knee arthroscopy  M95.8 (ICD-10-CM) - Osteochondral defect of femoral condyle  M17.12 (ICD-10-CM) - Primary osteoarthritis of left knee    THERAPY DIAG:  Acute pain of left knee  Localized edema  Difficulty in walking, not elsewhere classified  Muscle weakness (generalized)  Rationale for Evaluation and Treatment: Rehabilitation  ONSET DATE: 05/06/24  SUBJECTIVE:   SUBJECTIVE STATEMENT: Pt indicated going to the pool Thursday and Friday.  Felt really good Friday and Saturday.  Pt. Indicated pain at worst in last 24-48 hours:  3/10.   PERTINENT HISTORY: Cardiac cath, left knee arthroscopy 05/06/24   PAIN:  NPRS scale: up to 3/10.  Pain location: left knee Pain description: achy, throbbing with walking  Aggravating factors: walking, lying on her Rt side Relieving factors: changing positions, resting, ice  PRECAUTIONS: None  WEIGHT BEARING RESTRICTIONS: No  FALLS:  Has patient fallen in last 6 months? No  LIVING ENVIRONMENT: Lives with: lives with their family and lives with their spouse Lives in: House/apartment Stairs: Yes: External: 3 steps; none Has following equipment at home: Single point cane  OCCUPATION: retired  PLOF: Independent  PATIENT GOALS:  walk without cane, stop hurting  Next MD visit:   OBJECTIVE:   DIAGNOSTIC FINDINGS: IMPRESSION: 04/08/24 Tricompartmental osteoarthrosis. Mild to moderate chondromalacia with moderate reactive joint effusion.   Moderate radial tear at the root of the medial meniscus with medial displacement of body. There is second likely radial tear at the junction the body anterior horn of the medial meniscus. See above for more detail.  PATIENT SURVEYS:  Patient-Specific Activity Scoring Scheme  0 represents  "unable to perform." 10 represents "able to perform at prior level. 0 1 2 3 4 5 6 7 8 9  10 (Date and Score)   Activity Eval  05/31/24  8i/18/2025  1. Walk for exercise  4  4  2. Sit comfortably 4   5  3. steps 2 5  4. Standing from sitting 4 4  5.    Score 3.5 4.5 avg   Total score = sum of the activity scores/number of activities Minimum detectable change (90%CI) for average score = 2 points Minimum detectable change (90%CI) for single activity score = 3 points  COGNITION: 05/31/24 Overall cognitive status: WFL    SENSATION: 05/31/24 WFL  EDEMA:  05/31/24 Circumferential: Rt:   48.0 centimeters       Left:  51.5 centimeters   LOWER EXTREMITY ROM:   ROM Right Eval 05/31/24 Left Eval 05/31/24 Left 06/20/24  Hip flexion     Hip extension     Hip abduction     Hip adduction     Hip internal rotation     Hip external rotation     Knee flexion 118 95 108  Knee extension -4 0 0  Ankle dorsiflexion     Ankle plantarflexion     Ankle inversion     Ankle eversion      (Blank rows = not tested)  LOWER EXTREMITY MMT:  MMT Right Eval 05/31/24 Sitting HHD Left Eval 05/31/24 Sitting HHD Left 06/23/2024  Hip flexion 30.7 20.1   Hip extension     Hip abduction     Hip adduction     Hip internal rotation     Hip external rotation     Knee flexion 28.5 21.1   Knee extension 33.3 22.9 30, 31 lbs  Ankle dorsiflexion     Ankle plantarflexion     Ankle inversion     Ankle eversion      (Blank rows = not tested)  GAIT: 05/31/24 Distance walked: clinic distance Assistive device utilized: None, cane for community amb and uneven surfaces and stairs Level of assistance: Complete Independence to modified independent Comments: antalgic gait  TODAY'S TREATMENT                                                                           DATE: 07/04/2024 Therex: UBE LE only seat 11 for ROM 8 mins lvl 3.0 Incilne gastroc stretch 30 sec x 3 bilateral   TherActivity (to improve squat, stairs, ambulation) Leg press double leg in available range flexion 87 lbs x 20 Leg press single leg in available range flexion 37 lbs 2 x 15 bilaterally  Lateral step down 4 inch step x 15 bilaterally with occasional HHA on bar.   Neuro Re-ed Tandem stance 1 min x 1 bilateral on foam with occasional HHA SLS on black mat with contralateral leg tapping 4 corners x 6 each, performed bilaterally in // bars with occasional to moderate HHA, SBA from clinician     TODAY'S TREATMENT                                                                          DATE: 06/29/24 TherEx:  Nu Step level  for 8 minutes  Slant board gastroc stretch 3x30s  SAQ with 2# weight   TherAct:  Leg Press bilat LE 2x10 with 87# (slow eccentric)  Leg press single LE 2x10 with 37# (slow eccentric)  Step ups with 6 step with intermittent use of UE; 2x10 each leg   Neuro Re-Ed:  Narrow BOS on foam pad x1 minute with SBA  Narrow BOS on foam pad with head turns  Taps onto 8 inch step with each LE for strength and balance   TODAY'S TREATMENT                                                                          DATE8/05/2024 TherEx:  UBE LE only fwd, seat 11, level 1 for 8 minutes  Slant board gastroc stretch 3x30s   TherAct:  Leg Press bilat LE 2x10 with 87# (slow eccentric)  Leg press single LE 2x10 with 37# (slow eccentric)  Step ups with 6 step with intermittent use of UE; 2x10 each leg   Neuro Re-Ed:  Narrow BOS on foam pad x1 minute with SBA  Narrow BOS on foam pad with head turns (up/down, left/right) 1x10 each direction; increased ankle sway but no increased need for assist and no LOB  Tandem stance while catching and tossing ball to challenge reactional balance 1x30s each leg back; multiple instances of lateral LOB though  patient able to self correct with UE use on parallel bars   PATIENT EDUCATION:  07/04/2024 Education details: HEP update  Person educated: Patient Education method: Programmer, multimedia, Demonstration, Verbal cues, and Handouts Education comprehension: verbalized understanding, returned demonstration, and  verbal cues required  HOME EXERCISE PROGRAM: Access Code: [email or web address] URL: [email or web address] Date: 07/04/2024 Prepared by: Ozell Silvan  Exercises - Supine Bridge  - 2 x daily - 7 x weekly - 2 sets - 10 reps - 5 seconds hold - Supine Heel Slide with Strap  - 2 x daily - 7 x weekly - 2 sets - 10 reps - 3 seconds hold - Supine Knee Extension Strengthening  - 2 x daily - 7 x weekly - 2 sets - 10 reps - 5 seconds hold - Sit to Stand  - 2 x daily - 7 x weekly - 2 sets - 10 reps - Seated Quad Set (Mirrored)  - 3-5 x daily - 7 x weekly - 1 sets - 10 reps - 5 hold - Seated Knee Flexion AAROM (Mirrored)  - 2-3 x daily - 7 x weekly - 1 sets - 5 reps - 10-15 hold - Seated SLR  - 1-2 x daily - 7 x weekly - 1-2 sets - 10-15 reps - 2 hold - Lateral Step Down  - 1 x daily - 7 x weekly - 1-2 sets - 10 reps  ASSESSMENT:  CLINICAL IMPRESSION: Strength and balance gains to help improve functional ability and patient specific functional reporting that are still limited.  Continued skilled PT services medically necessary at this time.  Discussed progression of some strengthening in HEP.    OBJECTIVE IMPAIRMENTS: decreased mobility, difficulty walking, decreased ROM, decreased strength, increased edema, and pain.   ACTIVITY LIMITATIONS: bending, standing, squatting, and sleeping  PARTICIPATION LIMITATIONS: shopping and community activity  PERSONAL FACTORS: 3+ comorbidities: see PMH are also affecting patient's functional outcome.   REHAB POTENTIAL: Good  CLINICAL DECISION MAKING: Stable/uncomplicated  EVALUATION COMPLEXITY: Low   GOALS: Goals reviewed with patient? Yes  SHORT TERM  GOALS: (target date for Short term goals are 3 weeks 06/21/2024)   1.  Patient will demonstrate independent use of home exercise program to maintain progress from in clinic treatments.  Goal status: Met  LONG TERM GOALS: (target dates for all long term goals are 10 weeks  08/09/2024 )   1. Patient will demonstrate/report pain at worst less than or equal to 2/10 to facilitate minimal limitation in daily activity secondary to pain symptoms.  Goal status: on going 06/23/2024   2. Patient will demonstrate independent use of home exercise program to facilitate ability to maintain/progress functional gains from skilled physical therapy services.  Goal status: on going 06/23/2024   3. Patient will demonstrate Patient specific functional scale avg > or = 5.5  to indicate reduced disability due to condition.   Goal status: on going 06/23/2024   4.  Patient will demonstrate left LE MMT by >/= 5 pounds throughout to faciltiate usual transfers, stairs, squatting at PLOF for daily life.   Goal status: on going 06/23/2024   5.  Patient will demonstrate up and down stairs with single hand rail with reciprocal gait pattern.  Goal status: on going 06/23/2024   6.  Pt will be able to walk 1/2 mile with pain in her left knee of </= 2/10 for neighborhood walking with inclines and declines.  Goal status: on going 06/23/2024      PLAN:  PT FREQUENCY: 1-2x/week  PT DURATION: 10 weeks  PLANNED INTERVENTIONS: Can include 02853- PT Re-evaluation, 97110-Therapeutic exercises, 97530- Therapeutic activity, [id number]- Neuromuscular re-education, 97535- Self Care, 97140- Manual therapy, [id number]- Gait training, [phone]- Orthotic Fit/training, [phone]- Canalith repositioning, [id number]- Aquatic Therapy, H9716- Electrical stimulation (unattended), [phone]  Physical performance testing, 02983- Vasopneumatic device, [id number]- Ultrasound, [id number]- Traction (mechanical), [id number]- Ionotophoresis 4mg /ml Dexamethasone ,  79439 - Needle insertion w/o  injection 1 or 2 muscles, 20561 - Needle insertion w/o injection 3 or more muscles.   Patient/Family education, Balance training, Stair training, Taping, Dry Needling, Joint mobilization, Joint manipulation, Spinal manipulation, Spinal mobilization, Scar mobilization, Vestibular training, Visual/preceptual remediation/compensation, DME instructions, Cryotherapy, and Moist heat.  All performed as medically necessary.  All included unless contraindicated  PLAN FOR NEXT SESSION: Strength, balance improvements for WB control.   Ozell Silvan, PT, DPT, OCS, ATC 07/04/24  10:07 AM

## 2024-07-06 ENCOUNTER — Encounter

## 2024-07-11 ENCOUNTER — Ambulatory Visit (INDEPENDENT_AMBULATORY_CARE_PROVIDER_SITE_OTHER): Admitting: Physical Therapy

## 2024-07-11 ENCOUNTER — Encounter: Payer: Self-pay | Admitting: Physical Therapy

## 2024-07-11 DIAGNOSIS — R262 Difficulty in walking, not elsewhere classified: Secondary | ICD-10-CM

## 2024-07-11 DIAGNOSIS — R600 Localized edema: Secondary | ICD-10-CM

## 2024-07-11 DIAGNOSIS — M6281 Muscle weakness (generalized): Secondary | ICD-10-CM

## 2024-07-11 DIAGNOSIS — M25562 Pain in left knee: Secondary | ICD-10-CM | POA: Diagnosis not present

## 2024-07-11 NOTE — Therapy (Signed)
 OUTPATIENT PHYSICAL THERAPY TREATMENT   Patient Name: Madison Mosley MRN: [id number] DOB:[birth year], 73 y.o., female Today's Date: 07/11/2024  END OF SESSION:  PT End of Session - 07/11/24 0853     Visit Number 9    Number of Visits 20    Date for PT Re-Evaluation 08/12/24    Authorization Type UHC/ Medicare    Authorization Time Period 05/31/2024 - 07/26/2024    Authorization - Visit Number 9    Authorization - Number of Visits 16    Progress Note Due on Visit 10    PT Start Time 0845    PT Stop Time 0925    PT Time Calculation (min) 40 min    Activity Tolerance Patient tolerated treatment well    Behavior During Therapy [facility] for tasks assessed/performed                  Past Medical History:  Diagnosis Date   Arthritis    CAD (coronary artery disease)    stents   GERD (gastroesophageal reflux disease)    Headache    Hyperlipidemia    Hypertension    Past Surgical History:  Procedure Laterality Date   CARDIAC CATHETERIZATION  11/18/1999   Percutaneous revascularization precedure with angioplasty to the proximal LAD and first diagonal    CESAREAN SECTION     CHOLECYSTECTOMY     CORONARY STENT PLACEMENT     KNEE ARTHROSCOPY WITH MEDIAL MENISECTOMY Left 05/06/2024   Procedure: LEFT KNEE ARTHROSCOPY WITH MEDIAL MENISCECTOMY;  Surgeon: Harden Jerona GAILS, MD;  Location: [facility] OR;  Service: Orthopedics;  Laterality: Left;   PARTIAL HYSTERECTOMY     TONSILLECTOMY  11/17/1958   Patient Active Problem List   Diagnosis Date Noted   Osteochondral defect of femoral condyle 05/06/2024   Right knee meniscal tear 10/02/2020   Unilateral primary osteoarthritis, right knee 04/25/2020   Low back pain 03/21/2020   Statin myopathy 11/16/2019   Atrophic vaginitis 06/18/2017   Sprain of calcaneofibular ligament of left ankle 01/16/2017   Obesity (BMI 30.0-34.9) 01/05/2015   CAD S/P percutaneous coronary angioplasty 04/21/2013   Hypercholesterolemia 04/21/2013   Essential  hypertension 04/21/2013    PCP: Clarice Nottingham, MD   REFERRING PROVIDER: Harden Jerona GAILS, MD   REFERRING DIAG:  Diagnosis  [phone] (ICD-10-CM) - S/P left knee arthroscopy  M95.8 (ICD-10-CM) - Osteochondral defect of femoral condyle  M17.12 (ICD-10-CM) - Primary osteoarthritis of left knee    THERAPY DIAG:  Acute pain of left knee  Localized edema  Difficulty in walking, not elsewhere classified  Muscle weakness (generalized)  Rationale for Evaluation and Treatment: Rehabilitation  ONSET DATE: 05/06/24  SUBJECTIVE:   SUBJECTIVE STATEMENT: Pt arriving today reporting 4/10 pain in her left knee. Pt stating she was up for 6 hours standing making cupcakes at her church which may have aggravated her pain.   PERTINENT HISTORY: Cardiac cath, left knee arthroscopy 05/06/24   PAIN:  NPRS scale: up to 4/10.  Pain location: left knee Pain description: achy, throbbing with walking  Aggravating factors: walking, lying on her Rt side Relieving factors: changing positions, resting, ice  PRECAUTIONS: None  WEIGHT BEARING RESTRICTIONS: No  FALLS:  Has patient fallen in last 6 months? No  LIVING ENVIRONMENT: Lives with: lives with their family and lives with their spouse Lives in: House/apartment Stairs: Yes: External: 3 steps; none Has following equipment at home: Single point cane  OCCUPATION: retired  PLOF: Independent  PATIENT GOALS: walk without cane, stop hurting  Next MD visit:   OBJECTIVE:   DIAGNOSTIC FINDINGS: IMPRESSION: 04/08/24 Tricompartmental osteoarthrosis. Mild to moderate chondromalacia with moderate reactive joint effusion.   Moderate radial tear at the root of the medial meniscus with medial displacement of body. There is second likely radial tear at the junction the body anterior horn of the medial meniscus. See above for more detail.  PATIENT SURVEYS:  Patient-Specific Activity Scoring Scheme  0 represents "unable to perform." 10  represents "able to perform at prior level. 0 1 2 3 4 5 6 7 8 9  10 (Date and Score)   Activity Eval  05/31/24  8i/18/2025  1. Walk for exercise  4  4  2. Sit comfortably 4   5  3. steps 2 5  4. Standing from sitting 4 4  5.    Score 3.5 4.5 avg   Total score = sum of the activity scores/number of activities Minimum detectable change (90%CI) for average score = 2 points Minimum detectable change (90%CI) for single activity score = 3 points  COGNITION: 05/31/24 Overall cognitive status: WFL    SENSATION: 05/31/24 WFL  EDEMA:  05/31/24 Circumferential: Rt:   48.0 centimeters       Left:  51.5 centimeters   LOWER EXTREMITY ROM:   ROM Right Eval 05/31/24 Left Eval 05/31/24 Left 06/20/24  Hip flexion     Hip extension     Hip abduction     Hip adduction     Hip internal rotation     Hip external rotation     Knee flexion 118 95 108  Knee extension -4 0 0  Ankle dorsiflexion     Ankle plantarflexion     Ankle inversion     Ankle eversion      (Blank rows = not tested)  LOWER EXTREMITY MMT:  MMT Right Eval 05/31/24 Sitting HHD Left Eval 05/31/24 Sitting HHD Left 06/23/2024  Hip flexion 30.7 20.1   Hip extension     Hip abduction     Hip adduction     Hip internal rotation     Hip external rotation     Knee flexion 28.5 21.1   Knee extension 33.3 22.9 30, 31 lbs  Ankle dorsiflexion     Ankle plantarflexion     Ankle inversion     Ankle eversion      (Blank rows = not tested)  GAIT: 05/31/24 Distance walked: clinic distance Assistive device utilized: None, cane for community amb and uneven surfaces and stairs Level of assistance: Complete Independence to modified independent Comments: antalgic gait  TODAY'S TREATMENT                                                                           DATE: 07/11/24 TherEx:  Scifit: level 4, x 8 minutes Slant board gastroc stretch x 60s  SAQ with 3# weight 2 x 10 holding 3 sec  TherAct:  Leg Press bilat LE 2 x10 with 87# (slow eccentric)  Leg press single LE 2 x10 with 37# (slow eccentric)  Step downs on 4 inch step (heel taps) c bil UE support Sit to stand: x 10 from 20 inch mat table  Neuro Re-Ed:  Sliding disc: reaching ant/lat, lat and posterior x 10 each  Standing on Airex: staggered stance 3 x 30 sec c finger tap support as needed Side stepping 20 feet x 2 c close supervision, braiding front x 20 feet x 2 c HHA        TODAY'S TREATMENT                                                                          DATE: 07/04/2024 Therex: UBE LE only seat 11 for ROM 8 mins lvl 3.0 Incilne gastroc stretch 30 sec x 3 bilateral   TherActivity (to improve squat, stairs, ambulation) Leg press double leg in available range flexion 87 lbs x 20 Leg press single leg in available range flexion 37 lbs 2 x 15 bilaterally  Lateral step down 4 inch step x 15 bilaterally with occasional HHA on bar.   Neuro Re-ed Tandem stance 1 min x 1 bilateral on foam with occasional HHA SLS on black mat with contralateral leg tapping 4 corners x 6 each, performed bilaterally in // bars with occasional to moderate HHA, SBA from clinician     TODAY'S TREATMENT                                                                          DATE: 06/29/24 TherEx:  Nu Step level  for 8 minutes  Slant board gastroc stretch 3x30s  SAQ with 2# weight   TherAct:  Leg Press bilat LE 2x10 with 87# (slow eccentric)  Leg press single LE 2x10 with 37# (slow eccentric)  Step ups with 6 step with intermittent use of UE; 2x10 each leg   Neuro Re-Ed:  Narrow BOS on foam pad x1 minute with SBA  Narrow BOS on foam pad with head turns  Taps onto 8 inch step with each LE for strength and balance      PATIENT EDUCATION:  07/04/2024 Education details: HEP update   Person educated: Patient Education method: Programmer, multimedia, Demonstration, Verbal cues, and Handouts Education comprehension: verbalized understanding, returned demonstration, and  verbal cues required  HOME EXERCISE PROGRAM: Access Code: [email or web address] URL: [email or web address] Date: 07/04/2024 Prepared by: Ozell Silvan  Exercises - Supine Bridge  - 2 x daily - 7 x weekly - 2 sets - 10 reps - 5 seconds hold - Supine Heel Slide with Strap  - 2 x daily - 7 x weekly - 2 sets - 10 reps - 3 seconds hold - Supine Knee Extension Strengthening  - 2 x daily - 7 x weekly - 2 sets - 10 reps - 5 seconds hold - Sit to Stand  - 2 x daily - 7 x weekly - 2 sets - 10 reps - Seated Quad Set (Mirrored)  - 3-5 x daily - 7 x weekly - 1 sets - 10 reps - 5 hold - Seated Knee Flexion AAROM (Mirrored)  - 2-3 x daily - 7 x weekly - 1 sets - 5 reps - 10-15 hold - Seated SLR  - 1-2 x daily - 7 x weekly - 1-2 sets - 10-15 reps - 2 hold - Lateral Step Down  - 1 x daily - 7 x weekly - 1-2 sets - 10 reps  ASSESSMENT:  CLINICAL IMPRESSION: Pt presenting today with muscle fatigue after standing for 6 hours yesterday baking at her church. Pt tolerating exercises still progressing with strength and toward LTG's not met. We discussed possibly purchasing thigh high compression stocking to help with swelling.    OBJECTIVE IMPAIRMENTS: decreased mobility, difficulty walking, decreased ROM, decreased strength, increased edema, and pain.   ACTIVITY LIMITATIONS: bending, standing, squatting, and sleeping  PARTICIPATION LIMITATIONS: shopping and community activity  PERSONAL FACTORS: 3+ comorbidities: see PMH are also affecting patient's functional outcome.   REHAB POTENTIAL: Good  CLINICAL DECISION MAKING: Stable/uncomplicated  EVALUATION COMPLEXITY: Low   GOALS: Goals reviewed with patient? Yes  SHORT TERM GOALS: (target date for Short term goals are 3 weeks 06/21/2024)   1.  Patient will demonstrate  independent use of home exercise program to maintain progress from in clinic treatments.  Goal status: Met  LONG TERM GOALS: (target dates for all long term goals are 10 weeks  08/09/2024 )   1. Patient will demonstrate/report pain at worst less than or equal to 2/10 to facilitate minimal limitation in daily activity secondary to pain symptoms.  Goal status: on going 07/11/2024   2. Patient will demonstrate independent use of home exercise program to facilitate ability to maintain/progress functional gains from skilled physical therapy services.  Goal status: on going 07/11/2024   3. Patient will demonstrate Patient specific functional scale avg > or = 5.5  to indicate reduced disability due to condition.   Goal status: on going 06/23/2024   4.  Patient will demonstrate left LE MMT by >/= 5 pounds throughout to faciltiate usual transfers, stairs, squatting at PLOF for daily life.   Goal status: on going 06/23/2024   5.  Patient will demonstrate up and down stairs with single hand rail with reciprocal gait pattern.  Goal status: on going 06/23/2024   6.  Pt will be able to walk 1/2 mile with pain in her left knee of </= 2/10 for neighborhood walking with inclines and declines.  Goal status: on going 07/11/2024      PLAN:  PT FREQUENCY: 1-2x/week  PT DURATION: 10 weeks  PLANNED INTERVENTIONS: Can include 02853- PT Re-evaluation, 97110-Therapeutic exercises, 97530- Therapeutic activity, [id number]- Neuromuscular re-education, 97535- Self Care, 97140- Manual therapy, [id number]- Gait training, [phone]- Orthotic Fit/training, [phone]- Canalith repositioning, [id number]- Aquatic Therapy, H9716-  Electrical stimulation (unattended), [id number] Physical performance testing, 02983- Vasopneumatic device, [id number]- Ultrasound, [id number]- Traction (mechanical), [id number]- Ionotophoresis 4mg /ml Dexamethasone ,  79439 - Needle insertion w/o injection 1 or 2 muscles, 20561 - Needle insertion w/o injection 3 or more muscles.    Patient/Family education, Balance training, Stair training, Taping, Dry Needling, Joint mobilization, Joint manipulation, Spinal manipulation, Spinal mobilization, Scar mobilization, Vestibular training, Visual/preceptual remediation/compensation, DME instructions, Cryotherapy, and Moist heat.  All performed as medically necessary.  All included unless contraindicated  PLAN FOR NEXT SESSION: Strength, balance c  NMR for single leg activities and safety,  Progress Note due next visit  Delon Lunger, PT, MPT 07/11/24 9:14 AM   07/11/24  9:14 AM

## 2024-07-19 NOTE — Therapy (Signed)
 OUTPATIENT PHYSICAL THERAPY TREATMENT   Patient Name: Madison Mosley MRN: [id number] DOB:1951/10/30, 73 y.o., female Today's Date: 07/19/2024  END OF SESSION:            Past Medical History:  Diagnosis Date   Arthritis    CAD (coronary artery disease)    stents   GERD (gastroesophageal reflux disease)    Headache    Hyperlipidemia    Hypertension    Past Surgical History:  Procedure Laterality Date   CARDIAC CATHETERIZATION  11/18/1999   Percutaneous revascularization precedure with angioplasty to the proximal LAD and first diagonal    CESAREAN SECTION     CHOLECYSTECTOMY     CORONARY STENT PLACEMENT     KNEE ARTHROSCOPY WITH MEDIAL MENISECTOMY Left 05/06/2024   Procedure: LEFT KNEE ARTHROSCOPY WITH MEDIAL MENISCECTOMY;  Surgeon: Harden Jerona GAILS, MD;  Location: [facility] OR;  Service: Orthopedics;  Laterality: Left;   PARTIAL HYSTERECTOMY     TONSILLECTOMY  11/17/1958   Patient Active Problem List   Diagnosis Date Noted   Osteochondral defect of femoral condyle 05/06/2024   Right knee meniscal tear 10/02/2020   Unilateral primary osteoarthritis, right knee 04/25/2020   Low back pain 03/21/2020   Statin myopathy 11/16/2019   Atrophic vaginitis 06/18/2017   Sprain of calcaneofibular ligament of left ankle 01/16/2017   Obesity (BMI 30.0-34.9) 01/05/2015   CAD S/P percutaneous coronary angioplasty 04/21/2013   Hypercholesterolemia 04/21/2013   Essential hypertension 04/21/2013    PCP: Clarice Nottingham, MD   REFERRING PROVIDER: Harden Jerona GAILS, MD   REFERRING DIAG:  Diagnosis  [phone] (ICD-10-CM) - S/P left knee arthroscopy  M95.8 (ICD-10-CM) - Osteochondral defect of femoral condyle  M17.12 (ICD-10-CM) - Primary osteoarthritis of left knee    THERAPY DIAG:  No diagnosis found.  Rationale for Evaluation and Treatment: Rehabilitation  ONSET DATE: 05/06/24  SUBJECTIVE:   SUBJECTIVE STATEMENT: ***  Pt arriving today reporting 4/10 pain in her left knee. Pt  stating she was up for 6 hours standing making cupcakes at her church which may have aggravated her pain.   PERTINENT HISTORY: Cardiac cath, left knee arthroscopy 05/06/24   PAIN:  NPRS scale: up to 4/10.  Pain location: left knee Pain description: achy, throbbing with walking  Aggravating factors: walking, lying on her Rt side Relieving factors: changing positions, resting, ice  PRECAUTIONS: None  WEIGHT BEARING RESTRICTIONS: No  FALLS:  Has patient fallen in last 6 months? No  LIVING ENVIRONMENT: Lives with: lives with their family and lives with their spouse Lives in: House/apartment Stairs: Yes: External: 3 steps; none Has following equipment at home: Single point cane  OCCUPATION: retired  PLOF: Independent  PATIENT GOALS: walk without cane, stop hurting  Next MD visit:   OBJECTIVE:   DIAGNOSTIC FINDINGS: IMPRESSION: 04/08/24 Tricompartmental osteoarthrosis. Mild to moderate chondromalacia with moderate reactive joint effusion.   Moderate radial tear at the root of the medial meniscus with medial displacement of body. There is second likely radial tear at the junction the body anterior horn of the medial meniscus. See above for more detail.  PATIENT SURVEYS:  Patient-Specific Activity Scoring Scheme  0 represents "unable to perform." 10 represents "able to perform at prior level. 0 1 2 3 4 5 6 7 8 9  10 (Date and Score)   Activity Eval  05/31/24  8i/18/2025  1. Walk for exercise  4  4  2. Sit comfortably 4   5  3. steps 2 5  4. Standing from sitting 4  4  5.    Score 3.5 4.5 avg   Total score = sum of the activity scores/number of activities Minimum detectable change (90%CI) for average score = 2 points Minimum detectable change (90%CI) for single activity score = 3 points  COGNITION: 05/31/24 Overall cognitive status: WFL    SENSATION: 05/31/24 WFL  EDEMA:  05/31/24 Circumferential: Rt:   48.0 centimeters       Left:  51.5  centimeters   LOWER EXTREMITY ROM:   ROM Right Eval 05/31/24 Left Eval 05/31/24 Left 06/20/24  Hip flexion     Hip extension     Hip abduction     Hip adduction     Hip internal rotation     Hip external rotation     Knee flexion 118 95 108  Knee extension -4 0 0  Ankle dorsiflexion     Ankle plantarflexion     Ankle inversion     Ankle eversion      (Blank rows = not tested)  LOWER EXTREMITY MMT:  MMT Right Eval 05/31/24 Sitting HHD Left Eval 05/31/24 Sitting HHD Left 06/23/2024  Hip flexion 30.7 20.1   Hip extension     Hip abduction     Hip adduction     Hip internal rotation     Hip external rotation     Knee flexion 28.5 21.1   Knee extension 33.3 22.9 30, 31 lbs  Ankle dorsiflexion     Ankle plantarflexion     Ankle inversion     Ankle eversion      (Blank rows = not tested)  GAIT: 05/31/24 Distance walked: clinic distance Assistive device utilized: None, cane for community amb and uneven surfaces and stairs Level of assistance: Complete Independence to modified independent Comments: antalgic gait                                                                                                                                                                        TODAY'S TREATMENT                                                                          DATE: 07/20/24 ***progress note   TODAY'S TREATMENT  DATE: 07/11/24 TherEx:  Scifit: level 4, x 8 minutes Slant board gastroc stretch x 60s  SAQ with 3# weight 2 x 10 holding 3 sec  TherAct:  Leg Press bilat LE 2 x10 with 87# (slow eccentric)  Leg press single LE 2 x10 with 37# (slow eccentric)  Step downs on 4 inch step (heel taps) c bil UE support Sit to stand: x 10 from 20 inch mat table  Neuro Re-Ed:  Sliding disc: reaching ant/lat, lat and posterior x 10 each  Standing on Airex: staggered stance 3 x 30 sec c finger tap  support as needed Side stepping 20 feet x 2 c close supervision, braiding front x 20 feet x 2 c HHA        TODAY'S TREATMENT                                                                          DATE: 07/04/2024 Therex: UBE LE only seat 11 for ROM 8 mins lvl 3.0 Incilne gastroc stretch 30 sec x 3 bilateral   TherActivity (to improve squat, stairs, ambulation) Leg press double leg in available range flexion 87 lbs x 20 Leg press single leg in available range flexion 37 lbs 2 x 15 bilaterally  Lateral step down 4 inch step x 15 bilaterally with occasional HHA on bar.   Neuro Re-ed Tandem stance 1 min x 1 bilateral on foam with occasional HHA SLS on black mat with contralateral leg tapping 4 corners x 6 each, performed bilaterally in // bars with occasional to moderate HHA, SBA from clinician     TODAY'S TREATMENT                                                                          DATE: 06/29/24 TherEx:  Nu Step level  for 8 minutes  Slant board gastroc stretch 3x30s  SAQ with 2# weight   TherAct:  Leg Press bilat LE 2x10 with 87# (slow eccentric)  Leg press single LE 2x10 with 37# (slow eccentric)  Step ups with 6 step with intermittent use of UE; 2x10 each leg   Neuro Re-Ed:  Narrow BOS on foam pad x1 minute with SBA  Narrow BOS on foam pad with head turns  Taps onto 8 inch step with each LE for strength and balance      PATIENT EDUCATION:  07/04/2024 Education details: HEP update  Person educated: Patient Education method: Programmer, multimedia, Demonstration, Verbal cues, and Handouts Education comprehension: verbalized understanding, returned demonstration, and verbal cues required  HOME EXERCISE PROGRAM: Access Code: [email or web address] URL: [email or web address] Date: 07/04/2024 Prepared by: Ozell Silvan  Exercises - Supine Bridge  - 2 x daily - 7 x weekly - 2 sets - 10 reps - 5 seconds hold - Supine Heel Slide with Strap  - 2 x daily - 7 x weekly - 2  sets - 10 reps - 3 seconds hold - Supine Knee Extension Strengthening  - 2  x daily - 7 x weekly - 2 sets - 10 reps - 5 seconds hold - Sit to Stand  - 2 x daily - 7 x weekly - 2 sets - 10 reps - Seated Quad Set (Mirrored)  - 3-5 x daily - 7 x weekly - 1 sets - 10 reps - 5 hold - Seated Knee Flexion AAROM (Mirrored)  - 2-3 x daily - 7 x weekly - 1 sets - 5 reps - 10-15 hold - Seated SLR  - 1-2 x daily - 7 x weekly - 1-2 sets - 10-15 reps - 2 hold - Lateral Step Down  - 1 x daily - 7 x weekly - 1-2 sets - 10 reps  ASSESSMENT:  CLINICAL IMPRESSION: Pt presenting today with muscle fatigue after standing for 6 hours yesterday baking at her church. Pt tolerating exercises still progressing with strength and toward LTG's not met. We discussed possibly purchasing thigh high compression stocking to help with swelling.    OBJECTIVE IMPAIRMENTS: decreased mobility, difficulty walking, decreased ROM, decreased strength, increased edema, and pain.   ACTIVITY LIMITATIONS: bending, standing, squatting, and sleeping  PARTICIPATION LIMITATIONS: shopping and community activity  PERSONAL FACTORS: 3+ comorbidities: see PMH are also affecting patient's functional outcome.   REHAB POTENTIAL: Good  CLINICAL DECISION MAKING: Stable/uncomplicated  EVALUATION COMPLEXITY: Low   GOALS: Goals reviewed with patient? Yes  SHORT TERM GOALS: (target date for Short term goals are 3 weeks 06/21/2024)   1.  Patient will demonstrate independent use of home exercise program to maintain progress from in clinic treatments.  Goal status: Met  LONG TERM GOALS: (target dates for all long term goals are 10 weeks  08/09/2024 )   1. Patient will demonstrate/report pain at worst less than or equal to 2/10 to facilitate minimal limitation in daily activity secondary to pain symptoms.  Goal status: on going 07/11/2024   2. Patient will demonstrate independent use of home exercise program to facilitate ability to  maintain/progress functional gains from skilled physical therapy services.  Goal status: on going 07/11/2024   3. Patient will demonstrate Patient specific functional scale avg > or = 5.5  to indicate reduced disability due to condition.   Goal status: on going 06/23/2024   4.  Patient will demonstrate left LE MMT by >/= 5 pounds throughout to faciltiate usual transfers, stairs, squatting at PLOF for daily life.   Goal status: on going 06/23/2024   5.  Patient will demonstrate up and down stairs with single hand rail with reciprocal gait pattern.  Goal status: on going 06/23/2024   6.  Pt will be able to walk 1/2 mile with pain in her left knee of </= 2/10 for neighborhood walking with inclines and declines.  Goal status: on going 07/11/2024      PLAN:  PT FREQUENCY: 1-2x/week  PT DURATION: 10 weeks  PLANNED INTERVENTIONS: Can include 02853- PT Re-evaluation, 97110-Therapeutic exercises, 97530- Therapeutic activity, 97112- Neuromuscular re-education, 97535- Self Care, 97140- Manual therapy, [phone]- Gait training, [phone]- Orthotic Fit/training, [phone]- Canalith repositioning, [id number]- Aquatic Therapy, [phone]- Electrical stimulation (unattended), [id number] Physical performance testing, [id number]- Vasopneumatic device, [id number]- Ultrasound, [id number]- Traction (mechanical), [id number]- Ionotophoresis 4mg /ml Dexamethasone ,  [id number] - Needle insertion w/o injection 1 or 2 muscles, 20561 - Needle insertion w/o injection 3 or more muscles.   Patient/Family education, Balance training, Stair training, Taping, Dry Needling, Joint mobilization, Joint manipulation, Spinal manipulation, Spinal mobilization, Scar mobilization, Vestibular training, Visual/preceptual remediation/compensation, DME instructions, Cryotherapy, and Moist heat.  All performed as  medically necessary.  All included unless contraindicated  PLAN FOR NEXT SESSION: Strength, balance c  NMR for single leg activities and safety, *** Progress Note due next  visit  Susannah Daring, PT, DPT 07/19/24 7:09 AM

## 2024-07-20 ENCOUNTER — Ambulatory Visit (INDEPENDENT_AMBULATORY_CARE_PROVIDER_SITE_OTHER)

## 2024-07-20 DIAGNOSIS — R262 Difficulty in walking, not elsewhere classified: Secondary | ICD-10-CM

## 2024-07-20 DIAGNOSIS — R600 Localized edema: Secondary | ICD-10-CM

## 2024-07-20 DIAGNOSIS — M6281 Muscle weakness (generalized): Secondary | ICD-10-CM | POA: Diagnosis not present

## 2024-07-20 DIAGNOSIS — M25562 Pain in left knee: Secondary | ICD-10-CM

## 2024-07-21 NOTE — Therapy (Signed)
 OUTPATIENT PHYSICAL THERAPY TREATMENT   Patient Name: RYLYN ZAWISTOWSKI MRN: [id number] DOB:04/15/1951, 73 y.o., female Today's Date: 07/22/2024    END OF SESSION:  PT End of Session - 07/22/24 0932     Visit Number 11    Number of Visits 20    Date for PT Re-Evaluation 08/12/24    Authorization Type UHC/ Medicare    Authorization Time Period 05/31/2024 - 07/26/2024    Authorization - Number of Visits 16    Progress Note Due on Visit 20    PT Start Time 0932    PT Stop Time 1017    PT Time Calculation (min) 45 min    Activity Tolerance Patient limited by pain    Behavior During Therapy [facility] for tasks assessed/performed             Past Medical History:  Diagnosis Date   Arthritis    CAD (coronary artery disease)    stents   GERD (gastroesophageal reflux disease)    Headache    Hyperlipidemia    Hypertension    Past Surgical History:  Procedure Laterality Date   CARDIAC CATHETERIZATION  11/18/1999   Percutaneous revascularization precedure with angioplasty to the proximal LAD and first diagonal    CESAREAN SECTION     CHOLECYSTECTOMY     CORONARY STENT PLACEMENT     KNEE ARTHROSCOPY WITH MEDIAL MENISECTOMY Left 05/06/2024   Procedure: LEFT KNEE ARTHROSCOPY WITH MEDIAL MENISCECTOMY;  Surgeon: Harden Jerona GAILS, MD;  Location: [facility];  Service: Orthopedics;  Laterality: Left;   PARTIAL HYSTERECTOMY     TONSILLECTOMY  11/17/1958   Patient Active Problem List   Diagnosis Date Noted   Osteochondral defect of femoral condyle 05/06/2024   Right knee meniscal tear 10/02/2020   Unilateral primary osteoarthritis, right knee 04/25/2020   Low back pain 03/21/2020   Statin myopathy 11/16/2019   Atrophic vaginitis 06/18/2017   Sprain of calcaneofibular ligament of left ankle 01/16/2017   Obesity (BMI 30.0-34.9) 01/05/2015   CAD S/P percutaneous coronary angioplasty 04/21/2013   Hypercholesterolemia 04/21/2013   Essential hypertension 04/21/2013    PCP: Clarice Nottingham,  MD   REFERRING PROVIDER: Harden Jerona GAILS, MD   REFERRING DIAG:  Diagnosis  [phone] (ICD-10-CM) - S/P left knee arthroscopy  M95.8 (ICD-10-CM) - Osteochondral defect of femoral condyle  M17.12 (ICD-10-CM) - Primary osteoarthritis of left knee    THERAPY DIAG:  Acute pain of left knee  Localized edema  Difficulty in walking, not elsewhere classified  Muscle weakness (generalized)  Rationale for Evaluation and Treatment: Rehabilitation  ONSET DATE: 05/06/24  SUBJECTIVE:   SUBJECTIVE STATEMENT: Patient reporting migraine this morning with pain in knee rated at 4/10.   PERTINENT HISTORY: Cardiac cath, left knee arthroscopy 05/06/24   PAIN:  NPRS scale: up to 4/10.  Pain location: left knee Pain description: achy, throbbing with walking  Aggravating factors: walking, lying on her Rt side Relieving factors: changing positions, resting, ice  PRECAUTIONS: None  WEIGHT BEARING RESTRICTIONS: No  FALLS:  Has patient fallen in last 6 months? No  LIVING ENVIRONMENT: Lives with: lives with their family and lives with their spouse Lives in: House/apartment Stairs: Yes: External: 3 steps; none Has following equipment at home: Single point cane  OCCUPATION: retired  PLOF: Independent  PATIENT GOALS: walk without cane, stop hurting  Next MD visit:   OBJECTIVE:   DIAGNOSTIC FINDINGS: IMPRESSION: 04/08/24 Tricompartmental osteoarthrosis. Mild to moderate chondromalacia with moderate reactive joint effusion.   Moderate radial tear at  the root of the medial meniscus with medial displacement of body. There is second likely radial tear at the junction the body anterior horn of the medial meniscus. See above for more detail.  PATIENT SURVEYS:  Patient-Specific Activity Scoring Scheme  0 represents "unable to perform." 10 represents "able to perform at prior level. 0 1 2 3 4 5 6 7 8 9  10 (Date and Score)   Activity Eval  05/31/24  8i/18/2025 07/20/2024  1. Walk  for exercise  4  4 4   2. Sit comfortably 4   5 3   3. steps 2 5 5   4. Standing from sitting 4 4 6   5.     Score 3.5 4.5 avg 4.5    Total score = sum of the activity scores/number of activities Minimum detectable change (90%CI) for average score = 2 points Minimum detectable change (90%CI) for single activity score = 3 points  COGNITION: 05/31/24 Overall cognitive status: WFL    SENSATION: 05/31/24 WFL  EDEMA:  05/31/24 Circumferential: Rt:   48.0 centimeters       Left:  51.5 centimeters   LOWER EXTREMITY ROM:   ROM Right Eval 05/31/24 Left Eval 05/31/24 Left 06/20/24 Lt 07/20/2024  Hip flexion      Hip extension      Hip abduction      Hip adduction      Hip internal rotation      Hip external rotation      Knee flexion 118 95 108 105  Knee extension -4 0 0 10deg flexion with 8-9/10 pain  Ankle dorsiflexion      Ankle plantarflexion      Ankle inversion      Ankle eversion       (Blank rows = not tested)  LOWER EXTREMITY MMT:  MMT Right Eval 05/31/24 Sitting HHD Left Eval 05/31/24 Sitting HHD Left 06/23/2024 Lt  07/20/2024  Hip flexion 30.7 20.1  21.9  Hip extension      Hip abduction      Hip adduction      Hip internal rotation      Hip external rotation      Knee flexion 28.5 21.1  24.9, pain in popliteal fossa 2-3/10  Knee extension 33.3 22.9 30, 31 lbs 17.1, pain in popliteal fossa 8/10  Ankle dorsiflexion      Ankle plantarflexion      Ankle inversion      Ankle eversion       (Blank rows = not tested)  GAIT: 05/31/24 Distance walked: clinic distance Assistive device utilized: None, cane for community amb and uneven surfaces and stairs Level of assistance: Complete Independence to modified independent Comments: antalgic gait  TODAY'S TREATMENT                                                                           DATE: 07/22/24 TherEx:  Nustep level 3 for 8 minutes  Standing gastroc stretch on slant board 3x45s   TherAct:  Mini squats in parallel bars with bilat UE use 2x10  Calf raise into toe raise using bilat UE use for balance support 2x8  Neuro Re-Ed: Tandem stance on foam pad 3x30s each leg back with intermittent UE use for self-correction of lateral LOB  Standing taps to 2 step within parallel bars for UE use 2x10 tapping with each leg to strengthen single leg stance balance  One instance of Lt knee instability during single leg stance; patient endorses this occurs randomly during longer walks with husband  Tap downs from 2 step with focus on eccentric control 1x8 with each leg, but increased pain and discomfort with Rt tap down   TODAY'S TREATMENT                                                                          DATE: 07/21/24 TherEx: Recumbent bike level 1 x8 minutes  Standing gastroc stretch on slant board 3x30s; slight discomfort noted in popliteal fossa  Mini squats in parallel bars with bilat UE use 1x8  PT dicussed cutting back on strengthening activities for the rest of the day and to focus on mobility to decrease pain  Physical Performance:  MMT, ROM, PSFS with results noted above  PT discussing results with patient   Neuro Re-Ed:  Tandem stance on foam pad 3x30s each leg back with intermittent UE use for self-correction of lateral LOB  Standing cone taps within parallel bars for UE use 2x10 each leg to increase single leg stance time ; high level of difficulty  TODAY'S TREATMENT                                                                          DATE: 07/11/24 TherEx:  Scifit: level 4, x 8 minutes Slant board gastroc stretch x 60s  SAQ with 3# weight 2 x 10 holding 3 sec  TherAct:  Leg Press bilat LE 2 x10 with 87# (slow eccentric)  Leg press single LE 2 x10 with 37# (slow eccentric)  Step downs on 4 inch step (heel taps) c  bil UE support Sit to stand: x 10 from 20 inch mat table  Neuro Re-Ed:  Sliding disc: reaching ant/lat, lat and posterior x 10 each  Standing on Airex: staggered stance 3 x 30 sec c finger tap support as needed Side stepping 20 feet x 2 c close supervision, braiding front  x 20 feet x 2 c HHA    TODAY'S TREATMENT                                                                          DATE: 07/04/2024 Therex: UBE LE only seat 11 for ROM 8 mins lvl 3.0 Incilne gastroc stretch 30 sec x 3 bilateral   TherActivity (to improve squat, stairs, ambulation) Leg press double leg in available range flexion 87 lbs x 20 Leg press single leg in available range flexion 37 lbs 2 x 15 bilaterally  Lateral step down 4 inch step x 15 bilaterally with occasional HHA on bar.   Neuro Re-ed Tandem stance 1 min x 1 bilateral on foam with occasional HHA SLS on black mat with contralateral leg tapping 4 corners x 6 each, performed bilaterally in // bars with occasional to moderate HHA, SBA from clinician     TODAY'S TREATMENT                                                                          DATE: 06/29/24 TherEx:  Nu Step level  for 8 minutes  Slant board gastroc stretch 3x30s  SAQ with 2# weight   TherAct:  Leg Press bilat LE 2x10 with 87# (slow eccentric)  Leg press single LE 2x10 with 37# (slow eccentric)  Step ups with 6 step with intermittent use of UE; 2x10 each leg   Neuro Re-Ed:  Narrow BOS on foam pad x1 minute with SBA  Narrow BOS on foam pad with head turns  Taps onto 8 inch step with each LE for strength and balance      PATIENT EDUCATION:  07/04/2024 Education details: HEP update  Person educated: Patient Education method: Programmer, multimedia, Demonstration, Verbal cues, and Handouts Education comprehension: verbalized understanding, returned demonstration, and verbal cues required  HOME EXERCISE PROGRAM: Access Code: [email or web address] URL: [email or web address] Date:  07/04/2024 Prepared by: Ozell Silvan  Exercises - Supine Bridge  - 2 x daily - 7 x weekly - 2 sets - 10 reps - 5 seconds hold - Supine Heel Slide with Strap  - 2 x daily - 7 x weekly - 2 sets - 10 reps - 3 seconds hold - Supine Knee Extension Strengthening  - 2 x daily - 7 x weekly - 2 sets - 10 reps - 5 seconds hold - Sit to Stand  - 2 x daily - 7 x weekly - 2 sets - 10 reps - Seated Quad Set (Mirrored)  - 3-5 x daily - 7 x weekly - 1 sets - 10 reps - 5 hold - Seated Knee Flexion AAROM (Mirrored)  - 2-3 x daily - 7 x weekly - 1 sets - 5 reps - 10-15 hold - Seated SLR  - 1-2 x daily - 7 x weekly - 1-2 sets - 10-15 reps - 2 hold - Lateral Step Down  - 1 x daily - 7 x weekly - 1-2 sets -  10 reps  ASSESSMENT:  CLINICAL IMPRESSION: Patient arrived to session noting no change in symptoms while also experiencing a migraine. Patient tolerated Nustep with no pain compared to recumbent bike from last session. Patient continuing to have deficits secondary to higher pain levels and believes it may be from edema and fluid in the knee. Patient will continue to benefit from skilled PT.    OBJECTIVE IMPAIRMENTS: decreased mobility, difficulty walking, decreased ROM, decreased strength, increased edema, and pain.   ACTIVITY LIMITATIONS: bending, standing, squatting, and sleeping  PARTICIPATION LIMITATIONS: shopping and community activity  PERSONAL FACTORS: 3+ comorbidities: see PMH are also affecting patient's functional outcome.   REHAB POTENTIAL: Good  CLINICAL DECISION MAKING: Stable/uncomplicated  EVALUATION COMPLEXITY: Low   GOALS: Goals reviewed with patient? Yes  SHORT TERM GOALS: (target date for Short term goals are 3 weeks 06/21/2024)   1.  Patient will demonstrate independent use of home exercise program to maintain progress from in clinic treatments.  Goal status: Met  LONG TERM GOALS: (target dates for all long term goals are 10 weeks  08/09/2024 )   1. Patient will  demonstrate/report pain at worst less than or equal to 2/10 to facilitate minimal limitation in daily activity secondary to pain symptoms.  Goal status: on going 07/20/2024   2. Patient will demonstrate independent use of home exercise program to facilitate ability to maintain/progress functional gains from skilled physical therapy services.  Goal status: on going 07/20/2024   3. Patient will demonstrate Patient specific functional scale avg > or = 5.5  to indicate reduced disability due to condition.   Goal status: on going 07/20/2024   4.  Patient will demonstrate left LE MMT by >/= 5 pounds throughout to faciltiate usual transfers, stairs, squatting at PLOF for daily life.   Goal status: on going 07/20/2024   5.  Patient will demonstrate up and down stairs with single hand rail with reciprocal gait pattern.  Goal status: on going 07/20/2024   6.  Pt will be able to walk 1/2 mile with pain in her left knee of </= 2/10 for neighborhood walking with inclines and declines.  Goal status: on going 07/20/2024, 0.6 mile with pain of 3/10      PLAN:  PT FREQUENCY: 1-2x/week  PT DURATION: 10 weeks  PLANNED INTERVENTIONS: Can include 02853- PT Re-evaluation, 97110-Therapeutic exercises, 97530- Therapeutic activity, 97112- Neuromuscular re-education, 97535- Self Care, 97140- Manual therapy, [phone]- Gait training, [phone]- Orthotic Fit/training, [phone]- Canalith repositioning, [id number]- Aquatic Therapy, [phone]- Electrical stimulation (unattended), [id number] Physical performance testing, 97016- Vasopneumatic device, [id number]- Ultrasound, [id number]- Traction (mechanical), [id number]- Ionotophoresis 4mg /ml Dexamethasone ,  [id number] - Needle insertion w/o injection 1 or 2 muscles, 20561 - Needle insertion w/o injection 3 or more muscles.   Patient/Family education, Balance training, Stair training, Taping, Dry Needling, Joint mobilization, Joint manipulation, Spinal manipulation, Spinal mobilization, Scar mobilization, Vestibular  training, Visual/preceptual remediation/compensation, DME instructions, Cryotherapy, and Moist heat.  All performed as medically necessary.  All included unless contraindicated  PLAN FOR NEXT SESSION: Strength, balance c  NMR for single leg activities and safety, extension ROM activities    Susannah Daring, PT, DPT 07/22/24 11:22 AM

## 2024-07-22 ENCOUNTER — Ambulatory Visit (INDEPENDENT_AMBULATORY_CARE_PROVIDER_SITE_OTHER)

## 2024-07-22 DIAGNOSIS — R262 Difficulty in walking, not elsewhere classified: Secondary | ICD-10-CM | POA: Diagnosis not present

## 2024-07-22 DIAGNOSIS — R600 Localized edema: Secondary | ICD-10-CM

## 2024-07-22 DIAGNOSIS — M25562 Pain in left knee: Secondary | ICD-10-CM | POA: Diagnosis not present

## 2024-07-22 DIAGNOSIS — M6281 Muscle weakness (generalized): Secondary | ICD-10-CM

## 2024-07-25 ENCOUNTER — Encounter: Payer: Self-pay | Admitting: Rehabilitative and Restorative Service Providers"

## 2024-07-25 ENCOUNTER — Ambulatory Visit (INDEPENDENT_AMBULATORY_CARE_PROVIDER_SITE_OTHER): Admitting: Rehabilitative and Restorative Service Providers"

## 2024-07-25 DIAGNOSIS — R262 Difficulty in walking, not elsewhere classified: Secondary | ICD-10-CM | POA: Diagnosis not present

## 2024-07-25 DIAGNOSIS — M6281 Muscle weakness (generalized): Secondary | ICD-10-CM

## 2024-07-25 DIAGNOSIS — M25562 Pain in left knee: Secondary | ICD-10-CM

## 2024-07-25 DIAGNOSIS — R600 Localized edema: Secondary | ICD-10-CM

## 2024-07-25 NOTE — Therapy (Signed)
 OUTPATIENT PHYSICAL THERAPY TREATMENT /PROGRESS NOTE/ [facility] [employer]   Patient Name: Madison Mosley MRN: [id number] DOB:[birth year], 73 y.o., female Today's Date: 07/25/2024  Progress Note Reporting Period 07/20/2024 to 07/25/2024  See note below for Objective Data and Assessment of Progress/Goals.    END OF SESSION:  PT End of Session - 07/25/24 0939     Visit Number 12    Number of Visits 24    Date for PT Re-Evaluation 09/19/24    Authorization Type [employer]    Authorization Time Period 05/31/2024 - 07/26/2024    Authorization - Visit Number 12    Authorization - Number of Visits 16    Progress Note Due on Visit 22    PT Start Time 0929    PT Stop Time 1008    PT Time Calculation (min) 39 min    Activity Tolerance Patient limited by pain    Behavior During Therapy WFL for tasks assessed/performed              Past Medical History:  Diagnosis Date   Arthritis    CAD (coronary artery disease)    stents   GERD (gastroesophageal reflux disease)    Headache    Hyperlipidemia    Hypertension    Past Surgical History:  Procedure Laterality Date   CARDIAC CATHETERIZATION  11/18/1999   Percutaneous revascularization precedure with angioplasty to the proximal LAD and first diagonal    CESAREAN SECTION     CHOLECYSTECTOMY     CORONARY STENT PLACEMENT     KNEE ARTHROSCOPY WITH MEDIAL MENISECTOMY Left 05/06/2024   Procedure: LEFT KNEE ARTHROSCOPY WITH MEDIAL MENISCECTOMY;  Surgeon: Harden Jerona GAILS, MD;  Location: [facility] OR;  Service: Orthopedics;  Laterality: Left;   PARTIAL HYSTERECTOMY     TONSILLECTOMY  11/17/1958   Patient Active Problem List   Diagnosis Date Noted   Osteochondral defect of femoral condyle 05/06/2024   Right knee meniscal tear 10/02/2020   Unilateral primary osteoarthritis, right knee 04/25/2020   Low back pain 03/21/2020   Statin myopathy 11/16/2019   Atrophic vaginitis 06/18/2017   Sprain of calcaneofibular ligament of left ankle 01/16/2017    Obesity (BMI 30.0-34.9) 01/05/2015   CAD S/P percutaneous coronary angioplasty 04/21/2013   Hypercholesterolemia 04/21/2013   Essential hypertension 04/21/2013    PCP: Clarice Nottingham, MD   REFERRING PROVIDER: Harden Jerona GAILS, MD   REFERRING DIAG:  Diagnosis  [phone] (ICD-10-CM) - S/P left knee arthroscopy  M95.8 (ICD-10-CM) - Osteochondral defect of femoral condyle  M17.12 (ICD-10-CM) - Primary osteoarthritis of left knee    THERAPY DIAG:  Acute pain of left knee  Localized edema  Difficulty in walking, not elsewhere classified  Muscle weakness (generalized)  Rationale for Evaluation and Treatment: Rehabilitation  ONSET DATE: 05/06/24  SUBJECTIVE:   SUBJECTIVE STATEMENT: Pt indicated feeling like she still needs more help.  Reported locking up.  Pt indicated continued complaints/limitations of pain complaints in exercise but also in daily activity.  Some variance based on days.  Reported fluid buildup at times as well.   Reported buckling can occur daily.   PERTINENT HISTORY: Cardiac cath, left knee arthroscopy 05/06/24   PAIN:  NPRS scale: at worst in last few days : 3/10.   Pain location: left knee Pain description: achy, throbbing with walking  Aggravating factors: walking, lying on her Rt side Relieving factors: changing positions, resting, ice  PRECAUTIONS: None  WEIGHT BEARING RESTRICTIONS: No  FALLS:  Has patient fallen in last 6 months? No  LIVING ENVIRONMENT: Lives with: lives with their family and lives with their spouse Lives in: House/apartment Stairs: Yes: External: 3 steps; none Has following equipment at home: Single point cane  OCCUPATION: retired  PLOF: Independent  PATIENT GOALS: walk without cane, stop hurting  Next MD visit:   OBJECTIVE:   DIAGNOSTIC FINDINGS: IMPRESSION: 04/08/24 Tricompartmental osteoarthrosis. Mild to moderate chondromalacia with moderate reactive joint effusion.   Moderate radial tear at the root of the  medial meniscus with medial displacement of body. There is second likely radial tear at the junction the body anterior horn of the medial meniscus. See above for more detail.  PATIENT SURVEYS:  Patient-Specific Activity Scoring Scheme  0 represents "unable to perform." 10 represents "able to perform at prior level. 0 1 2 3 4 5 6 7 8 9  10 (Date and Score)   Activity Eval  05/31/24  8i/18/2025 07/20/2024  1. Walk for exercise  4  4 4   2. Sit comfortably 4   5 3   3. steps 2 5 5   4. Standing from sitting 4 4 6   5.     Score 3.5 4.5 avg 4.5    Total score = sum of the activity scores/number of activities Minimum detectable change (90%CI) for average score = 2 points Minimum detectable change (90%CI) for single activity score = 3 points  COGNITION: 05/31/24 Overall cognitive status: WFL    SENSATION: 05/31/24 WFL  EDEMA:  07/25/2024: Lt 19.5 inches  , Rt 19.75 inch (worse compression last night.   05/31/24 Circumferential: Rt:   48.0 centimeters       Left:  51.5 centimeters   LOWER EXTREMITY ROM:   ROM Right Eval 05/31/24 Left Eval 05/31/24 Left 06/20/24 Lt 07/20/2024 Left 07/25/2024  Hip flexion       Hip extension       Hip abduction       Hip adduction       Hip internal rotation       Hip external rotation       Knee flexion 118 95 108 105 105 AROM in supine heel slide  Knee extension -4 0 0 10deg flexion with 8-9/10 pain -4 in supine active quad set  Ankle dorsiflexion       Ankle plantarflexion       Ankle inversion       Ankle eversion        (Blank rows = not tested)  LOWER EXTREMITY MMT:  MMT Right Eval 05/31/24 Sitting HHD Left Eval 05/31/24 Sitting HHD Left 06/23/2024 Lt  07/20/2024 Left 07/25/2024  Hip flexion 30.7 20.1  21.9   Hip extension       Hip abduction       Hip adduction       Hip internal rotation       Hip external rotation       Knee flexion 28.5 21.1  24.9, pain in popliteal fossa 2-3/10 4/5  Knee extension 33.3 22.9 30, 31 lbs  17.1, pain in popliteal fossa 8/10 4+/5 29, 31 lbs   Ankle dorsiflexion       Ankle plantarflexion       Ankle inversion       Ankle eversion        (Blank rows = not tested)  GAIT: 07/25/2024: Independent ambulation with decreased stance , toe off progression Lt leg.  Maintained knee flexion in stance. Antalgic gait noted.   05/31/24 Distance walked: clinic distance Assistive device utilized: None, cane for community  amb and uneven surfaces and stairs Level of assistance: Complete Independence to modified independent Comments: antalgic gait                                                                                                                                                                         TODAY'S TREATMENT                                                                          DATE: 07/25/24 TherEx:  UBE lvl 3.0 seat 13 for ROM - 10.5 mins Supine bridge 2 x 5 with movement into knee flexion as tolerated.  Supine Lt hip extension into table Lt leg 5 sec hold x 10  Supine Lt leg LAQ in 90 deg hip flexion x 10 AROM  Seated quad set with SLR Lt 2 x 10  Review of HEP in NWB activity to help swelling, mobility and strength.  Seated LAQ Lt leg x 15 with pauses into end range extension and flexion      TODAY'S TREATMENT                                                                          DATE: 07/22/24 TherEx:  Nustep level 3 for 8 minutes  Standing gastroc stretch on slant board 3x45s   TherAct:  Mini squats in parallel bars with bilat UE use 2x10  Calf raise into toe raise using bilat UE use for balance support 2x8  Neuro Re-Ed: Tandem stance on foam pad 3x30s each leg back with intermittent UE use for self-correction of lateral LOB  Standing taps to 2 step within parallel bars for UE use 2x10 tapping with each leg to strengthen single leg stance balance  One instance of Lt knee instability during single leg stance; patient endorses this occurs randomly during  longer walks with husband  Tap downs from 2 step with focus on eccentric control 1x8 with each leg, but increased pain and discomfort with Rt tap down   TODAY'S TREATMENT  DATE: 07/21/24 TherEx: Recumbent bike level 1 x8 minutes  Standing gastroc stretch on slant board 3x30s; slight discomfort noted in popliteal fossa  Mini squats in parallel bars with bilat UE use 1x8  PT dicussed cutting back on strengthening activities for the rest of the day and to focus on mobility to decrease pain  Physical Performance:  MMT, ROM, PSFS with results noted above  PT discussing results with patient   Neuro Re-Ed:  Tandem stance on foam pad 3x30s each leg back with intermittent UE use for self-correction of lateral LOB  Standing cone taps within parallel bars for UE use 2x10 each leg to increase single leg stance time ; high level of difficulty  TODAY'S TREATMENT                                                                          DATE: 07/11/24 TherEx:  Scifit: level 4, x 8 minutes Slant board gastroc stretch x 60s  SAQ with 3# weight 2 x 10 holding 3 sec  TherAct:  Leg Press bilat LE 2 x10 with 87# (slow eccentric)  Leg press single LE 2 x10 with 37# (slow eccentric)  Step downs on 4 inch step (heel taps) c bil UE support Sit to stand: x 10 from 20 inch mat table  Neuro Re-Ed:  Sliding disc: reaching ant/lat, lat and posterior x 10 each  Standing on Airex: staggered stance 3 x 30 sec c finger tap support as needed Side stepping 20 feet x 2 c close supervision, braiding front x 20 feet x 2 c HHA    TODAY'S TREATMENT                                                                          DATE: 07/04/2024 Therex: UBE LE only seat 11 for ROM 8 mins lvl 3.0 Incilne gastroc stretch 30 sec x 3 bilateral   TherActivity (to improve squat, stairs, ambulation) Leg press double leg in available range flexion 87 lbs x 20 Leg  press single leg in available range flexion 37 lbs 2 x 15 bilaterally  Lateral step down 4 inch step x 15 bilaterally with occasional HHA on bar.   Neuro Re-ed Tandem stance 1 min x 1 bilateral on foam with occasional HHA SLS on black mat with contralateral leg tapping 4 corners x 6 each, performed bilaterally in // bars with occasional to moderate HHA, SBA from clinician      PATIENT EDUCATION:  07/04/2024 Education details: HEP update  Person educated: Patient Education method: Programmer, multimedia, Demonstration, Verbal cues, and Handouts Education comprehension: verbalized understanding, returned demonstration, and verbal cues required  HOME EXERCISE PROGRAM: Access Code: [email or web address] URL: [email or web address] Date: 07/04/2024 Prepared by: Ozell Silvan  Exercises - Supine Bridge  - 2 x daily - 7 x weekly - 2 sets - 10 reps - 5 seconds hold - Supine Heel Slide with Strap  - 2 x daily - 7 x weekly -  2 sets - 10 reps - 3 seconds hold - Supine Knee Extension Strengthening  - 2 x daily - 7 x weekly - 2 sets - 10 reps - 5 seconds hold - Sit to Stand  - 2 x daily - 7 x weekly - 2 sets - 10 reps - Seated Quad Set (Mirrored)  - 3-5 x daily - 7 x weekly - 1 sets - 10 reps - 5 hold - Seated Knee Flexion AAROM (Mirrored)  - 2-3 x daily - 7 x weekly - 1 sets - 5 reps - 10-15 hold - Seated SLR  - 1-2 x daily - 7 x weekly - 1-2 sets - 10-15 reps - 2 hold - Lateral Step Down  - 1 x daily - 7 x weekly - 1-2 sets - 10 reps  ASSESSMENT:  CLINICAL IMPRESSION: The patient has attended 12 visits over the course of treatment cycle.  Patient has reported overall improvement with global rating of change at about the same (recent worsened symptoms noted).  See objective data above for updated information regarding current presentation.  Pt has demonstrated some improvements in range, strength as measured.  Period of improvement was interrupted by reported worsened symptom complaints which led to  worsened objective data.  Some recovery in data has been noted in last week.  Continued Lt knee pain with WB difficulty with strength, mobility and balance deficits noted.  Continued skilled PT services warranted at this time with suggested return to MD as well.     OBJECTIVE IMPAIRMENTS: decreased mobility, difficulty walking, decreased ROM, decreased strength, increased edema, and pain.   ACTIVITY LIMITATIONS: bending, standing, squatting, and sleeping  PARTICIPATION LIMITATIONS: shopping and community activity  PERSONAL FACTORS: 3+ comorbidities: see PMH are also affecting patient's functional outcome.   REHAB POTENTIAL: Good  CLINICAL DECISION MAKING: Stable/uncomplicated  EVALUATION COMPLEXITY: Low   GOALS: Goals reviewed with patient? Yes  SHORT TERM GOALS: (target date for Short term goals are 3 weeks 06/21/2024)   1.  Patient will demonstrate independent use of home exercise program to maintain progress from in clinic treatments.  Goal status: Met  LONG TERM GOALS: (target dates for all long term goals are 8 weeks  09/19/2024 )   1. Patient will demonstrate/report pain at worst less than or equal to 2/10 to facilitate minimal limitation in daily activity secondary to pain symptoms.  Goal status: revised on 07/25/2024   2. Patient will demonstrate independent use of home exercise program to facilitate ability to maintain/progress functional gains from skilled physical therapy services.  Goal status: revised on 07/25/2024   3. Patient will demonstrate Patient specific functional scale avg > or = 5.5  to indicate reduced disability due to condition.   Goal status: revised on 07/25/2024   4.  Patient will demonstrate left LE MMT 5/5, within 15% of Rt dynamometry without pain throughout to faciltiate usual transfers, stairs, squatting at PLOF for daily life.   Goal status: revised on 07/25/2024   5.  Patient will demonstrate up and down stairs with single hand rail with  reciprocal gait pattern.  Goal status: revised on 07/25/2024   6.  Pt will be able to walk 1/2 mile with pain in her left knee of </= 2/10 for neighborhood walking with inclines and declines.  Goal status: revised on 07/25/2024      PLAN:  PT FREQUENCY: 2x/week  PT DURATION: 8 weeks (12 visits)  PLANNED INTERVENTIONS: Can include 02853- PT Re-evaluation, 97110-Therapeutic exercises, 97530- Therapeutic activity,  02887- Neuromuscular re-education, [phone]- Self Care, 02859- Manual therapy, [id number]- Gait training, [phone]- Orthotic Fit/training, [phone]- Canalith repositioning, [id number]- Aquatic Therapy, [phone]- Electrical stimulation (unattended), [id number] Physical performance testing, 97016- Vasopneumatic device, [id number]- Ultrasound, [id number]- Traction (mechanical), [id number]- Ionotophoresis 4mg /ml Dexamethasone ,  79439 - Needle insertion w/o injection 1 or 2 muscles, 20561 - Needle insertion w/o injection 3 or more muscles.   Patient/Family education, Balance training, Stair training, Taping, Dry Needling, Joint mobilization, Joint manipulation, Spinal manipulation, Spinal mobilization, Scar mobilization, Vestibular training, Visual/preceptual remediation/compensation, DME instructions, Cryotherapy, and Moist heat.  All performed as medically necessary.  All included unless contraindicated  PLAN FOR NEXT SESSION:  Check on [facility] Medicare approval for visits.    Ozell Silvan, PT, DPT, OCS, ATC 07/25/24  10:06 AM   Asking for 12 more visits from 07/26/2024 until 09/19/2024  Date of referral: 05/26/24 Referring provider: Harden Jerona GAILS, MD Referring diagnosis?  Z98.890 (ICD-10-CM) - S/P left knee arthroscopy  M95.8 (ICD-10-CM) - Osteochondral defect of femoral condyle  M17.12 (ICD-10-CM) - Primary osteoarthritis of left knee    Treatment diagnosis? (if different than referring diagnosis) M25.562 , R26.2, M62.81, R60.0   What was this (referring dx) caused by? Surgery (Type: left knee arthroscopy with medial  meniscectomy)  Lysle of Condition: Initial Onset (within last 3 months)   Laterality: Lt  Current Functional Measure Score: Patient Specific Functional Scale eval:  05/31/2024 avg 3.5    07/20/2024 update:  4.5 avg  Objective measurements identify impairments when they are compared to normal values, the uninvolved extremity, and prior level of function.  [x]  Yes  []  No  Objective assessment of functional ability: Moderate functional limitations   Briefly describe symptoms: Symptoms Lt knee noted post surgery.  Complaints noted in daily activity and WB activity.  Symptoms has improved but worsened over last few weeks insidiously.  Symptom worsening impacted functional strength, standing, walking and other daily activity. Symptoms reported consistently during the day with some variability in intensity.   How did symptoms start: Surgery   Average pain intensity:  Last 24 hours: 3/10  Past week: up to 7-8/10  How often does the pt experience symptoms? Constantly  How much have the symptoms interfered with usual daily activities? Quite a bit  How has condition changed since care began at this facility? No change  In general, how is the patients overall health? Good   BACK PAIN (STarT Back Screening Tool) No

## 2024-07-27 ENCOUNTER — Ambulatory Visit (INDEPENDENT_AMBULATORY_CARE_PROVIDER_SITE_OTHER): Admitting: Rehabilitative and Restorative Service Providers"

## 2024-07-27 ENCOUNTER — Encounter: Payer: Self-pay | Admitting: Rehabilitative and Restorative Service Providers"

## 2024-07-27 DIAGNOSIS — M25562 Pain in left knee: Secondary | ICD-10-CM

## 2024-07-27 DIAGNOSIS — R262 Difficulty in walking, not elsewhere classified: Secondary | ICD-10-CM | POA: Diagnosis not present

## 2024-07-27 DIAGNOSIS — M6281 Muscle weakness (generalized): Secondary | ICD-10-CM | POA: Diagnosis not present

## 2024-07-27 DIAGNOSIS — R600 Localized edema: Secondary | ICD-10-CM | POA: Diagnosis not present

## 2024-07-27 NOTE — Therapy (Addendum)
 OUTPATIENT PHYSICAL THERAPY TREATMENT   Patient Name: Madison Mosley MRN: [id number] DOB:12-26-1950, 73 y.o., female Today's Date: 07/27/2024     END OF SESSION:  PT End of Session - 07/27/24 1405     Visit Number 13    Number of Visits 24    Date for PT Re-Evaluation 09/19/24    Authorization Type [employer]    Authorization Time Period 07/25/2024 - 08/22/2024    Authorization - Visit Number 2    Authorization - Number of Visits 4    Progress Note Due on Visit 15    PT Start Time 1341    PT Stop Time 1422    PT Time Calculation (min) 41 min    Activity Tolerance Patient tolerated treatment well    Behavior During Therapy WFL for tasks assessed/performed               Past Medical History:  Diagnosis Date   Arthritis    CAD (coronary artery disease)    stents   GERD (gastroesophageal reflux disease)    Headache    Hyperlipidemia    Hypertension    Past Surgical History:  Procedure Laterality Date   CARDIAC CATHETERIZATION  11/18/1999   Percutaneous revascularization precedure with angioplasty to the proximal LAD and first diagonal    CESAREAN SECTION     CHOLECYSTECTOMY     CORONARY STENT PLACEMENT     KNEE ARTHROSCOPY WITH MEDIAL MENISECTOMY Left 05/06/2024   Procedure: LEFT KNEE ARTHROSCOPY WITH MEDIAL MENISCECTOMY;  Surgeon: Harden Jerona GAILS, MD;  Location: [facility] OR;  Service: Orthopedics;  Laterality: Left;   PARTIAL HYSTERECTOMY     TONSILLECTOMY  11/17/1958   Patient Active Problem List   Diagnosis Date Noted   Osteochondral defect of femoral condyle 05/06/2024   Right knee meniscal tear 10/02/2020   Unilateral primary osteoarthritis, right knee 04/25/2020   Low back pain 03/21/2020   Statin myopathy 11/16/2019   Atrophic vaginitis 06/18/2017   Sprain of calcaneofibular ligament of left ankle 01/16/2017   Obesity (BMI 30.0-34.9) 01/05/2015   CAD S/P percutaneous coronary angioplasty 04/21/2013   Hypercholesterolemia 04/21/2013   Essential  hypertension 04/21/2013    PCP: Clarice Nottingham, MD   REFERRING PROVIDER: Harden Jerona GAILS, MD   REFERRING DIAG:  Diagnosis  [phone] (ICD-10-CM) - S/P left knee arthroscopy  M95.8 (ICD-10-CM) - Osteochondral defect of femoral condyle  M17.12 (ICD-10-CM) - Primary osteoarthritis of left knee    THERAPY DIAG:  Acute pain of left knee  Localized edema  Difficulty in walking, not elsewhere classified  Muscle weakness (generalized)  Rationale for Evaluation and Treatment: Rehabilitation  ONSET DATE: 05/06/24  SUBJECTIVE:   SUBJECTIVE STATEMENT: Pt indicated trying to go more time medicine free.  Pt indicated 2-3/10 upon arrival  today.  Reported yesterday was a good day with some walking included.   PERTINENT HISTORY: Cardiac cath, left knee arthroscopy 05/06/24   PAIN:  NPRS scale: at worst in last few days : 3/10.   Pain location: left knee Pain description: achy, throbbing with walking  Aggravating factors: walking, lying on her Rt side Relieving factors: changing positions, resting, ice  PRECAUTIONS: None  WEIGHT BEARING RESTRICTIONS: No  FALLS:  Has patient fallen in last 6 months? No  LIVING ENVIRONMENT: Lives with: lives with their family and lives with their spouse Lives in: House/apartment Stairs: Yes: External: 3 steps; none Has following equipment at home: Single point cane  OCCUPATION: retired  PLOF: Independent  PATIENT GOALS: walk without  cane, stop hurting  Next MD visit:   OBJECTIVE:   DIAGNOSTIC FINDINGS: IMPRESSION: 04/08/24 Tricompartmental osteoarthrosis. Mild to moderate chondromalacia with moderate reactive joint effusion.   Moderate radial tear at the root of the medial meniscus with medial displacement of body. There is second likely radial tear at the junction the body anterior horn of the medial meniscus. See above for more detail.  PATIENT SURVEYS:  Patient-Specific Activity Scoring Scheme  0 represents "unable to  perform." 10 represents "able to perform at prior level. 0 1 2 3 4 5 6 7 8 9  10 (Date and Score)   Activity Eval  05/31/24  8i/18/2025 07/20/2024  1. Walk for exercise  4  4 4   2. Sit comfortably 4   5 3   3. steps 2 5 5   4. Standing from sitting 4 4 6   5.     Score 3.5 4.5 avg 4.5    Total score = sum of the activity scores/number of activities Minimum detectable change (90%CI) for average score = 2 points Minimum detectable change (90%CI) for single activity score = 3 points  COGNITION: 05/31/24 Overall cognitive status: WFL    SENSATION: 05/31/24 WFL  EDEMA:  07/25/2024: Lt 19.5 inches  , Rt 19.75 inch (worse compression last night.   05/31/24 Circumferential: Rt:   48.0 centimeters       Left:  51.5 centimeters   LOWER EXTREMITY ROM:   ROM Right Eval 05/31/24 Left Eval 05/31/24 Left 06/20/24 Lt 07/20/2024 Left 07/25/2024  Hip flexion       Hip extension       Hip abduction       Hip adduction       Hip internal rotation       Hip external rotation       Knee flexion 118 95 108 105 105 AROM in supine heel slide  Knee extension -4 0 0 10deg flexion with 8-9/10 pain -4 in supine active quad set  Ankle dorsiflexion       Ankle plantarflexion       Ankle inversion       Ankle eversion        (Blank rows = not tested)  LOWER EXTREMITY MMT:  MMT Right Eval 05/31/24 Sitting HHD Left Eval 05/31/24 Sitting HHD Left 06/23/2024 Lt  07/20/2024 Left 07/25/2024  Hip flexion 30.7 20.1  21.9   Hip extension       Hip abduction       Hip adduction       Hip internal rotation       Hip external rotation       Knee flexion 28.5 21.1  24.9, pain in popliteal fossa 2-3/10 4/5  Knee extension 33.3 22.9 30, 31 lbs 17.1, pain in popliteal fossa 8/10 4+/5 29, 31 lbs   Ankle dorsiflexion       Ankle plantarflexion       Ankle inversion       Ankle eversion        (Blank rows = not tested)  GAIT: 07/25/2024: Independent ambulation with decreased stance , toe off progression Lt  leg.  Maintained knee flexion in stance. Antalgic gait noted.   05/31/24 Distance walked: clinic distance Assistive device utilized: None, cane for community amb and uneven surfaces and stairs Level of assistance: Complete Independence to modified independent Comments: antalgic gait  TODAY'S TREATMENT                                                                          DATE: 07/27/2024 TherEx:  UBE lvl 3.0 seat 13 for ROM - 8.50mins with :10 second interval faster top of each minute.  Incline gastroc stretch 30 sec x 3 bilateral Seated Lt leg LAQ with end range pauses each direction 4 lb weight 2 x 15   TherActivity (to improve stairs, squatting, transfers) Leg press double leg x 15 75 lbs  Leg press single leg Lt 37 lbs x 15  Neuro Re-ed Standing alternating PF/DF ankle strategy x 15 each way with SBA and occasional HHA SLS with contralateral leg step over and back 6 inch hurdle with focus on knee flexion over hurdle x 10 bilateral with finger tip assist on bar.    TODAY'S TREATMENT                                                                          DATE: 07/25/24 TherEx:  UBE lvl 3.0 seat 13 for ROM - 10.5 mins Supine bridge 2 x 5 with movement into knee flexion as tolerated.  Supine Lt hip extension into table Lt leg 5 sec hold x 10  Supine Lt leg LAQ in 90 deg hip flexion x 10 AROM  Seated quad set with SLR Lt 2 x 10  Review of HEP in NWB activity to help swelling, mobility and strength.  Seated LAQ Lt leg x 15 with pauses into end range extension and flexion      TODAY'S TREATMENT                                                                          DATE: 07/22/24 TherEx:  Nustep level 3 for 8 minutes  Standing gastroc stretch on slant board 3x45s   TherAct:  Mini squats in parallel bars with bilat  UE use 2x10  Calf raise into toe raise using bilat UE use for balance support 2x8  Neuro Re-Ed: Tandem stance on foam pad 3x30s each leg back with intermittent UE use for self-correction of lateral LOB  Standing taps to 2 step within parallel bars for UE use 2x10 tapping with each leg to strengthen single leg stance balance  One instance of Lt knee instability during single leg stance; patient endorses this occurs randomly during longer walks with husband  Tap downs from 2 step with focus on eccentric control 1x8 with each leg, but increased pain and discomfort with Rt tap down   TODAY'S TREATMENT  DATE: 07/21/24 TherEx: Recumbent bike level 1 x8 minutes  Standing gastroc stretch on slant board 3x30s; slight discomfort noted in popliteal fossa  Mini squats in parallel bars with bilat UE use 1x8  PT dicussed cutting back on strengthening activities for the rest of the day and to focus on mobility to decrease pain  Physical Performance:  MMT, ROM, PSFS with results noted above  PT discussing results with patient   Neuro Re-Ed:  Tandem stance on foam pad 3x30s each leg back with intermittent UE use for self-correction of lateral LOB  Standing cone taps within parallel bars for UE use 2x10 each leg to increase single leg stance time ; high level of difficulty    PATIENT EDUCATION:  07/04/2024 Education details: HEP update  Person educated: Patient Education method: Programmer, multimedia, Demonstration, Verbal cues, and Handouts Education comprehension: verbalized understanding, returned demonstration, and verbal cues required  HOME EXERCISE PROGRAM: Access Code: [email or web address] URL: [email or web address] Date: 07/04/2024 Prepared by: Ozell Silvan  Exercises - Supine Bridge  - 2 x daily - 7 x weekly - 2 sets - 10 reps - 5 seconds hold - Supine Heel Slide with Strap  - 2 x daily - 7 x weekly - 2 sets - 10 reps -  3 seconds hold - Supine Knee Extension Strengthening  - 2 x daily - 7 x weekly - 2 sets - 10 reps - 5 seconds hold - Sit to Stand  - 2 x daily - 7 x weekly - 2 sets - 10 reps - Seated Quad Set (Mirrored)  - 3-5 x daily - 7 x weekly - 1 sets - 10 reps - 5 hold - Seated Knee Flexion AAROM (Mirrored)  - 2-3 x daily - 7 x weekly - 1 sets - 5 reps - 10-15 hold - Seated SLR  - 1-2 x daily - 7 x weekly - 1-2 sets - 10-15 reps - 2 hold - Lateral Step Down  - 1 x daily - 7 x weekly - 1-2 sets - 10 reps  ASSESSMENT:  CLINICAL IMPRESSION: Improvement in tolernace to standing activity today. Continued beneift of skilled PT services to help improve LE strength and symptoms for functional daily activity improvements.  Insurance approved 4 visits total (2 left after today).  Medical necessity for continued treatment indicated to help progress towards goals established.   OBJECTIVE IMPAIRMENTS: decreased mobility, difficulty walking, decreased ROM, decreased strength, increased edema, and pain.   ACTIVITY LIMITATIONS: bending, standing, squatting, and sleeping  PARTICIPATION LIMITATIONS: shopping and community activity  PERSONAL FACTORS: 3+ comorbidities: see PMH are also affecting patient's functional outcome.   REHAB POTENTIAL: Good  CLINICAL DECISION MAKING: Stable/uncomplicated  EVALUATION COMPLEXITY: Low   GOALS: Goals reviewed with patient? Yes  SHORT TERM GOALS: (target date for Short term goals are 3 weeks 06/21/2024)   1.  Patient will demonstrate independent use of home exercise program to maintain progress from in clinic treatments.  Goal status: Met  LONG TERM GOALS: (target dates for all long term goals are 8 weeks  09/19/2024 )   1. Patient will demonstrate/report pain at worst less than or equal to 2/10 to facilitate minimal limitation in daily activity secondary to pain symptoms.  Goal status: revised on 07/25/2024   2. Patient will demonstrate independent use of home exercise  program to facilitate ability to maintain/progress functional gains from skilled physical therapy services.  Goal status: revised on 07/25/2024   3. Patient will demonstrate Patient specific functional scale avg > or =  5.5  to indicate reduced disability due to condition.   Goal status: revised on 07/25/2024   4.  Patient will demonstrate left LE MMT 5/5, within 15% of Rt dynamometry without pain throughout to faciltiate usual transfers, stairs, squatting at PLOF for daily life.   Goal status: revised on 07/25/2024   5.  Patient will demonstrate up and down stairs with single hand rail with reciprocal gait pattern.  Goal status: revised on 07/25/2024   6.  Pt will be able to walk 1/2 mile with pain in her left knee of </= 2/10 for neighborhood walking with inclines and declines.  Goal status: revised on 07/25/2024      PLAN:  PT FREQUENCY: 2x/week  PT DURATION: 8 weeks   (was only given 4 visits from 07/25/2024 - 08/22/2024)  PLANNED INTERVENTIONS: Can include 02853- PT Re-evaluation, 97110-Therapeutic exercises, 97530- Therapeutic activity, 97112- Neuromuscular re-education, 97535- Self Care, 97140- Manual therapy, [phone]- Gait training, [phone]- Orthotic Fit/training, [phone]- Canalith repositioning, [id number]- Aquatic Therapy, [phone]- Electrical stimulation (unattended), [id number] Physical performance testing, 97016- Vasopneumatic device, [id number]- Ultrasound, [id number]- Traction (mechanical), [id number]- Ionotophoresis 4mg /ml Dexamethasone ,  79439 - Needle insertion w/o injection 1 or 2 muscles, 20561 - Needle insertion w/o injection 3 or more muscles.   Patient/Family education, Balance training, Stair training, Taping, Dry Needling, Joint mobilization, Joint manipulation, Spinal manipulation, Spinal mobilization, Scar mobilization, Vestibular training, Visual/preceptual remediation/compensation, DME instructions, Cryotherapy, and Moist heat.  All performed as medically necessary.  All included unless  contraindicated  PLAN FOR NEXT SESSION:  Only got 4 visits from last request, has 2 left.  Continue to improve balance control, WB strength/loading response.    Ozell Silvan, PT, DPT, OCS, ATC 07/27/24  2:21 PM     Date of referral: 05/26/24 Referring provider: Harden Jerona GAILS, MD Referring diagnosis?  Z98.890 (ICD-10-CM) - S/P left knee arthroscopy  M95.8 (ICD-10-CM) - Osteochondral defect of femoral condyle  M17.12 (ICD-10-CM) - Primary osteoarthritis of left knee    Treatment diagnosis? (if different than referring diagnosis) M25.562 , R26.2, M62.81, R60.0   What was this (referring dx) caused by? Surgery (Type: left knee arthroscopy with medial meniscectomy)  Lysle of Condition: Initial Onset (within last 3 months)   Laterality: Lt  Current Functional Measure Score: Patient Specific Functional Scale eval:  05/31/2024 avg 3.5    07/20/2024 update:  4.5 avg  Objective measurements identify impairments when they are compared to normal values, the uninvolved extremity, and prior level of function.  [x]  Yes  []  No  Objective assessment of functional ability: Moderate functional limitations   Briefly describe symptoms: Symptoms Lt knee noted post surgery.  Complaints noted in daily activity and WB activity.  Symptoms has improved but worsened over last few weeks insidiously.  Symptom worsening impacted functional strength, standing, walking and other daily activity. Symptoms reported consistently during the day with some variability in intensity.   How did symptoms start: Surgery   Average pain intensity:  Last 24 hours: 3/10  Past week: up to 7-8/10  How often does the pt experience symptoms? Constantly  How much have the symptoms interfered with usual daily activities? Quite a bit  How has condition changed since care began at this facility? No change  In general, how is the patients overall health? Good   BACK PAIN (STarT Back Screening Tool) No

## 2024-08-01 ENCOUNTER — Encounter: Payer: Self-pay | Admitting: Rehabilitative and Restorative Service Providers"

## 2024-08-01 ENCOUNTER — Ambulatory Visit (INDEPENDENT_AMBULATORY_CARE_PROVIDER_SITE_OTHER): Admitting: Rehabilitative and Restorative Service Providers"

## 2024-08-01 ENCOUNTER — Other Ambulatory Visit: Payer: Self-pay

## 2024-08-01 ENCOUNTER — Ambulatory Visit (INDEPENDENT_AMBULATORY_CARE_PROVIDER_SITE_OTHER): Admitting: Physician Assistant

## 2024-08-01 ENCOUNTER — Encounter: Payer: Self-pay | Admitting: Physician Assistant

## 2024-08-01 DIAGNOSIS — M1712 Unilateral primary osteoarthritis, left knee: Secondary | ICD-10-CM | POA: Diagnosis not present

## 2024-08-01 DIAGNOSIS — M6281 Muscle weakness (generalized): Secondary | ICD-10-CM

## 2024-08-01 DIAGNOSIS — G8929 Other chronic pain: Secondary | ICD-10-CM

## 2024-08-01 DIAGNOSIS — R262 Difficulty in walking, not elsewhere classified: Secondary | ICD-10-CM

## 2024-08-01 DIAGNOSIS — M25562 Pain in left knee: Secondary | ICD-10-CM

## 2024-08-01 DIAGNOSIS — R600 Localized edema: Secondary | ICD-10-CM

## 2024-08-01 NOTE — Therapy (Signed)
 OUTPATIENT PHYSICAL THERAPY TREATMENT   Patient Name: Madison Mosley MRN: [id number] DOB:21-Jan-1951, 73 y.o., female Today's Date: 08/01/2024   END OF SESSION:  PT End of Session - 08/01/24 1534     Visit Number 14    Number of Visits 24    Date for PT Re-Evaluation 09/19/24    Authorization Type [employer]    Authorization Time Period 07/25/2024 - 08/22/2024    Authorization - Visit Number 3    Authorization - Number of Visits 4    Progress Note Due on Visit 15    PT Start Time 1510    PT Stop Time 1550    PT Time Calculation (min) 40 min    Activity Tolerance Patient tolerated treatment well    Behavior During Therapy WFL for tasks assessed/performed                Past Medical History:  Diagnosis Date   Arthritis    CAD (coronary artery disease)    stents   GERD (gastroesophageal reflux disease)    Headache    Hyperlipidemia    Hypertension    Past Surgical History:  Procedure Laterality Date   CARDIAC CATHETERIZATION  11/18/1999   Percutaneous revascularization precedure with angioplasty to the proximal LAD and first diagonal    CESAREAN SECTION     CHOLECYSTECTOMY     CORONARY STENT PLACEMENT     KNEE ARTHROSCOPY WITH MEDIAL MENISECTOMY Left 05/06/2024   Procedure: LEFT KNEE ARTHROSCOPY WITH MEDIAL MENISCECTOMY;  Surgeon: Harden Jerona GAILS, MD;  Location: [facility] OR;  Service: Orthopedics;  Laterality: Left;   PARTIAL HYSTERECTOMY     TONSILLECTOMY  11/17/1958   Patient Active Problem List   Diagnosis Date Noted   Osteochondral defect of femoral condyle 05/06/2024   Right knee meniscal tear 10/02/2020   Unilateral primary osteoarthritis, right knee 04/25/2020   Low back pain 03/21/2020   Statin myopathy 11/16/2019   Atrophic vaginitis 06/18/2017   Sprain of calcaneofibular ligament of left ankle 01/16/2017   Obesity (BMI 30.0-34.9) 01/05/2015   CAD S/P percutaneous coronary angioplasty 04/21/2013   Hypercholesterolemia 04/21/2013   Essential  hypertension 04/21/2013    PCP: Clarice Nottingham, MD   REFERRING PROVIDER: Harden Jerona GAILS, MD   REFERRING DIAG:  Diagnosis  [phone] (ICD-10-CM) - S/P left knee arthroscopy  M95.8 (ICD-10-CM) - Osteochondral defect of femoral condyle  M17.12 (ICD-10-CM) - Primary osteoarthritis of left knee    THERAPY DIAG:  Acute pain of left knee  Localized edema  Difficulty in walking, not elsewhere classified  Muscle weakness (generalized)  Rationale for Evaluation and Treatment: Rehabilitation  ONSET DATE: 05/06/24  SUBJECTIVE:   SUBJECTIVE STATEMENT: Pt indicated discussion in follow up with PA was about possible inclusion of TKA due to continued joint pain and swelling response.  Pt voiced hesitation about jumping to that just right now.  PERTINENT HISTORY: Cardiac cath, left knee arthroscopy 05/06/24   PAIN:  NPRS scale: upon arrival 2/10 Pain location: left knee Pain description: achy, throbbing with walking  Aggravating factors: walking, lying on her Rt side Relieving factors: changing positions, resting, ice  PRECAUTIONS: None  WEIGHT BEARING RESTRICTIONS: No  FALLS:  Has patient fallen in last 6 months? No  LIVING ENVIRONMENT: Lives with: lives with their family and lives with their spouse Lives in: House/apartment Stairs: Yes: External: 3 steps; none Has following equipment at home: Single point cane  OCCUPATION: retired  PLOF: Independent  PATIENT GOALS: walk without cane, stop hurting  Next MD visit:   OBJECTIVE:   DIAGNOSTIC FINDINGS: IMPRESSION: 04/08/24 Tricompartmental osteoarthrosis. Mild to moderate chondromalacia with moderate reactive joint effusion.   Moderate radial tear at the root of the medial meniscus with medial displacement of body. There is second likely radial tear at the junction the body anterior horn of the medial meniscus. See above for more detail.  PATIENT SURVEYS:  Patient-Specific Activity Scoring Scheme  0 represents  "unable to perform." 10 represents "able to perform at prior level. 0 1 2 3 4 5 6 7 8 9  10 (Date and Score)   Activity Eval  05/31/24  8i/18/2025 07/20/2024  1. Walk for exercise  4  4 4   2. Sit comfortably 4   5 3   3. steps 2 5 5   4. Standing from sitting 4 4 6   5.     Score 3.5 4.5 avg 4.5    Total score = sum of the activity scores/number of activities Minimum detectable change (90%CI) for average score = 2 points Minimum detectable change (90%CI) for single activity score = 3 points  COGNITION: 05/31/24 Overall cognitive status: WFL    SENSATION: 05/31/24 WFL  EDEMA:  07/25/2024: Lt 19.5 inches  , Rt 19.75 inch (worse compression last night.   05/31/24 Circumferential: Rt:   48.0 centimeters       Left:  51.5 centimeters   LOWER EXTREMITY ROM:   ROM Right Eval 05/31/24 Left Eval 05/31/24 Left 06/20/24 Lt 07/20/2024 Left 07/25/2024  Hip flexion       Hip extension       Hip abduction       Hip adduction       Hip internal rotation       Hip external rotation       Knee flexion 118 95 108 105 105 AROM in supine heel slide  Knee extension -4 0 0 10deg flexion with 8-9/10 pain -4 in supine active quad set  Ankle dorsiflexion       Ankle plantarflexion       Ankle inversion       Ankle eversion        (Blank rows = not tested)  LOWER EXTREMITY MMT:  MMT Right Eval 05/31/24 Sitting HHD Left Eval 05/31/24 Sitting HHD Left 06/23/2024 Lt  07/20/2024 Left 07/25/2024  Hip flexion 30.7 20.1  21.9   Hip extension       Hip abduction       Hip adduction       Hip internal rotation       Hip external rotation       Knee flexion 28.5 21.1  24.9, pain in popliteal fossa 2-3/10 4/5  Knee extension 33.3 22.9 30, 31 lbs 17.1, pain in popliteal fossa 8/10 4+/5 29, 31 lbs   Ankle dorsiflexion       Ankle plantarflexion       Ankle inversion       Ankle eversion        (Blank rows = not tested)  GAIT: 07/25/2024: Independent ambulation with decreased stance , toe off  progression Lt leg.  Maintained knee flexion in stance. Antalgic gait noted.   05/31/24 Distance walked: clinic distance Assistive device utilized: None, cane for community amb and uneven surfaces and stairs Level of assistance: Complete Independence to modified independent Comments: antalgic gait  TODAY'S TREATMENT                                                                          DATE: 08/01/2024 TherEx:  UBE lvl 3.0 seat 13 for ROM - 10 mins   Continued education on NWB strengthening (quad set, SLR, etc in HEP).  Incline board stretch 30 sec x 3 bilateral   TherActivity (to improve stairs, squatting, transfers) Leg press double leg x 15 81 lbs  Leg press single leg Lt 37 lbs x 15   Neuro Re-ed Tandem stance on foam 1 min x 1 bilateral with occasional HHA SLS with contralateral leg step over and back 9 inch x 10 bilateral with focus on balance control and knee flexion over hurdle.     TODAY'S TREATMENT                                                                          DATE: 07/27/2024 TherEx:  UBE lvl 3.0 seat 13 for ROM - 8.54mins with :10 second interval faster top of each minute.  Incline gastroc stretch 30 sec x 3 bilateral Seated Lt leg LAQ with end range pauses each direction 4 lb weight 2 x 15   TherActivity (to improve stairs, squatting, transfers) Leg press double leg x 20 75 lbs  Leg press single leg Lt 37 lbs x 15  Neuro Re-ed Standing alternating PF/DF ankle strategy x 15 each way with SBA and occasional HHA SLS with contralateral leg step over and back 6 inch hurdle with focus on knee flexion over hurdle x 10 bilateral with finger tip assist on bar.    TODAY'S TREATMENT                                                                          DATE: 07/25/24 TherEx:  UBE lvl 3.0 seat 13  for ROM - 10.5 mins Supine bridge 2 x 5 with movement into knee flexion as tolerated.  Supine Lt hip extension into table Lt leg 5 sec hold x 10  Supine Lt leg LAQ in 90 deg hip flexion x 10 AROM  Seated quad set with SLR Lt 2 x 10  Review of HEP in NWB activity to help swelling, mobility and strength.  Seated LAQ Lt leg x 15 with pauses into end range extension and flexion      TODAY'S TREATMENT  DATE: 07/22/24 TherEx:  Nustep level 3 for 8 minutes  Standing gastroc stretch on slant board 3x45s   TherAct:  Mini squats in parallel bars with bilat UE use 2x10  Calf raise into toe raise using bilat UE use for balance support 2x8  Neuro Re-Ed: Tandem stance on foam pad 3x30s each leg back with intermittent UE use for self-correction of lateral LOB  Standing taps to 2 step within parallel bars for UE use 2x10 tapping with each leg to strengthen single leg stance balance  One instance of Lt knee instability during single leg stance; patient endorses this occurs randomly during longer walks with husband  Tap downs from 2 step with focus on eccentric control 1x8 with each leg, but increased pain and discomfort with Rt tap down   PATIENT EDUCATION:  07/04/2024 Education details: HEP update  Person educated: Patient Education method: Programmer, multimedia, Demonstration, Verbal cues, and Handouts Education comprehension: verbalized understanding, returned demonstration, and verbal cues required  HOME EXERCISE PROGRAM: Access Code: [email or web address] URL: [email or web address] Date: 07/04/2024 Prepared by: Ozell Silvan  Exercises - Supine Bridge  - 2 x daily - 7 x weekly - 2 sets - 10 reps - 5 seconds hold - Supine Heel Slide with Strap  - 2 x daily - 7 x weekly - 2 sets - 10 reps - 3 seconds hold - Supine Knee Extension Strengthening  - 2 x daily - 7 x weekly - 2 sets - 10 reps - 5 seconds hold - Sit to Stand  - 2 x  daily - 7 x weekly - 2 sets - 10 reps - Seated Quad Set (Mirrored)  - 3-5 x daily - 7 x weekly - 1 sets - 10 reps - 5 hold - Seated Knee Flexion AAROM (Mirrored)  - 2-3 x daily - 7 x weekly - 1 sets - 5 reps - 10-15 hold - Seated SLR  - 1-2 x daily - 7 x weekly - 1-2 sets - 10-15 reps - 2 hold - Lateral Step Down  - 1 x daily - 7 x weekly - 1-2 sets - 10 reps  ASSESSMENT:  CLINICAL IMPRESSION: Discussed importance of maintaining strength and general mobility of knee in time prior to any decisions in future about additional surgery.  Detailed use of HEP to promote best improvement and management possible.  Has one more visit approved, will recheck for POC updates then.   OBJECTIVE IMPAIRMENTS: decreased mobility, difficulty walking, decreased ROM, decreased strength, increased edema, and pain.   ACTIVITY LIMITATIONS: bending, standing, squatting, and sleeping  PARTICIPATION LIMITATIONS: shopping and community activity  PERSONAL FACTORS: 3+ comorbidities: see PMH are also affecting patient's functional outcome.   REHAB POTENTIAL: Good  CLINICAL DECISION MAKING: Stable/uncomplicated  EVALUATION COMPLEXITY: Low   GOALS: Goals reviewed with patient? Yes  SHORT TERM GOALS: (target date for Short term goals are 3 weeks 06/21/2024)   1.  Patient will demonstrate independent use of home exercise program to maintain progress from in clinic treatments.  Goal status: Met  LONG TERM GOALS: (target dates for all long term goals are 8 weeks  09/19/2024 )   1. Patient will demonstrate/report pain at worst less than or equal to 2/10 to facilitate minimal limitation in daily activity secondary to pain symptoms.  Goal status: revised on 07/25/2024   2. Patient will demonstrate independent use of home exercise program to facilitate ability to maintain/progress functional gains from skilled physical therapy services.  Goal status: revised on 07/25/2024   3. Patient  will demonstrate Patient specific  functional scale avg > or = 5.5  to indicate reduced disability due to condition.   Goal status: revised on 07/25/2024   4.  Patient will demonstrate left LE MMT 5/5, within 15% of Rt dynamometry without pain throughout to faciltiate usual transfers, stairs, squatting at PLOF for daily life.   Goal status: revised on 07/25/2024   5.  Patient will demonstrate up and down stairs with single hand rail with reciprocal gait pattern.  Goal status: revised on 07/25/2024   6.  Pt will be able to walk 1/2 mile with pain in her left knee of </= 2/10 for neighborhood walking with inclines and declines.  Goal status: revised on 07/25/2024      PLAN:  PT FREQUENCY: 2x/week  PT DURATION: 8 weeks   (was only given 4 visits from 07/25/2024 - 08/22/2024)  PLANNED INTERVENTIONS: Can include 02853- PT Re-evaluation, 97110-Therapeutic exercises, 97530- Therapeutic activity, 97112- Neuromuscular re-education, 97535- Self Care, 97140- Manual therapy, [phone]- Gait training, [phone]- Orthotic Fit/training, [phone]- Canalith repositioning, [id number]- Aquatic Therapy, [phone]- Electrical stimulation (unattended), [id number] Physical performance testing, 97016- Vasopneumatic device, [id number]- Ultrasound, [id number]- Traction (mechanical), [id number]- Ionotophoresis 4mg /ml Dexamethasone ,  79439 - Needle insertion w/o injection 1 or 2 muscles, 20561 - Needle insertion w/o injection 3 or more muscles.   Patient/Family education, Balance training, Stair training, Taping, Dry Needling, Joint mobilization, Joint manipulation, Spinal manipulation, Spinal mobilization, Scar mobilization, Vestibular training, Visual/preceptual remediation/compensation, DME instructions, Cryotherapy, and Moist heat.  All performed as medically necessary.  All included unless contraindicated  PLAN FOR NEXT SESSION:  1 more visit available    Ozell Silvan, PT, DPT, OCS, ATC 08/01/24  3:49 PM     Date of referral: 05/26/24 Referring provider: Harden Jerona GAILS, MD Referring  diagnosis?  Z98.890 (ICD-10-CM) - S/P left knee arthroscopy  M95.8 (ICD-10-CM) - Osteochondral defect of femoral condyle  M17.12 (ICD-10-CM) - Primary osteoarthritis of left knee    Treatment diagnosis? (if different than referring diagnosis) M25.562 , R26.2, M62.81, R60.0   What was this (referring dx) caused by? Surgery (Type: left knee arthroscopy with medial meniscectomy)  Lysle of Condition: Initial Onset (within last 3 months)   Laterality: Lt  Current Functional Measure Score: Patient Specific Functional Scale eval:  05/31/2024 avg 3.5    07/20/2024 update:  4.5 avg  Objective measurements identify impairments when they are compared to normal values, the uninvolved extremity, and prior level of function.  [x]  Yes  []  No  Objective assessment of functional ability: Moderate functional limitations   Briefly describe symptoms: Symptoms Lt knee noted post surgery.  Complaints noted in daily activity and WB activity.  Symptoms has improved but worsened over last few weeks insidiously.  Symptom worsening impacted functional strength, standing, walking and other daily activity. Symptoms reported consistently during the day with some variability in intensity.   How did symptoms start: Surgery   Average pain intensity:  Last 24 hours: 3/10  Past week: up to 7-8/10  How often does the pt experience symptoms? Constantly  How much have the symptoms interfered with usual daily activities? Quite a bit  How has condition changed since care began at this facility? No change  In general, how is the patients overall health? Good   BACK PAIN (STarT Back Screening Tool) No

## 2024-08-01 NOTE — Progress Notes (Signed)
 Office Visit Note   Patient: Madison Mosley           Date of Birth: [birth year]           MRN: [id number] Visit Date: 08/01/2024              Requested by: Clarice Nottingham, MD [address] [place],  [place] [address] PCP: Clarice Nottingham, MD  Chief Complaint  Patient presents with   Left Knee - Pain, Routine Post Op    05/06/2024 left knee scope       HPI: 73 y/o female s/p knee arthroscopy on 05/06/24.  She was diagnosed with a medial meniscus tear on the left knee.  She failed conservative treatment prior to undergoing knee arthroscopy.  No traumatic injury in the past. She has been in PT without much improvement.  She states at the beginning after her arthroscopy her left knee felt better.  Now she has pain with flexion/ext and it seems to swell on/off.    Assessment & Plan: Visit Diagnoses:  1. Chronic pain of left knee   2. Arthritis of left knee     Plan: We discussed that she should continue to do exercises and keep her knee strong.  She ill call us  when she is ready to move forward with left TKA.  She has failed conservative treatments to include arthroscopy , PT and RICE with NSAIDS.    Follow-Up Instructions: No follow-ups on file.   Ortho Exam  Patient is alert, oriented, no adenopathy, well-dressed, normal affect, normal respiratory effort. Antalgic gait.  Palpable DP pulse, multiple telangectasia veins surrounding her ankle.  -10 degrees of full extension and 85 degrees of flexion on the left knee.  Medial joint line pain.  Patella crepitus with active flexion and extension to palpation.      Imaging: AP left knee shows medial joint line joint space lost with bone on bone OA.  No subchondral cyst.    Labs: No results found for: HGBA1C, ESRSEDRATE, CRP, LABURIC, REPTSTATUS, GRAMSTAIN, CULT, LABORGA   Lab Results  Component Value Date   ALBUMIN 3.2 (L) 05/06/2024    No results found for: MG No results found for:  VD25OH  No results found for: PREALBUMIN    Latest Ref Rng & Units 05/06/2024    7:10 AM  CBC EXTENDED  WBC 4.0 - 10.5 K/uL 8.2   RBC 3.87 - 5.11 MIL/uL 4.01   Hemoglobin 12.0 - 15.0 g/dL 87.8   HCT 63.9 - 53.9 % 37.9   Platelets 150 - 400 K/uL 250   NEUT# 1.7 - 7.7 K/uL 5.3   Lymph# 0.7 - 4.0 K/uL 2.2      There is no height or weight on file to calculate BMI.  Orders:  Orders Placed This Encounter  Procedures   XR Knee 1-2 Views Left   No orders of the defined types were placed in this encounter.    Procedures: No procedures performed  Clinical Data: No additional findings.  ROS:  All other systems negative, except as noted in the HPI. Review of Systems  Objective: Vital Signs: There were no vitals taken for this visit.  Specialty Comments:  No specialty comments available.  PMFS History: Patient Active Problem List   Diagnosis Date Noted   Osteochondral defect of femoral condyle 05/06/2024   Right knee meniscal tear 10/02/2020   Unilateral primary osteoarthritis, right knee 04/25/2020   Low back pain 03/21/2020   Statin myopathy 11/16/2019  Atrophic vaginitis 06/18/2017   Sprain of calcaneofibular ligament of left ankle 01/16/2017   Obesity (BMI 30.0-34.9) 01/05/2015   CAD S/P percutaneous coronary angioplasty 04/21/2013   Hypercholesterolemia 04/21/2013   Essential hypertension 04/21/2013   Past Medical History:  Diagnosis Date   Arthritis    CAD (coronary artery disease)    stents   GERD (gastroesophageal reflux disease)    Headache    Hyperlipidemia    Hypertension     Family History  Problem Relation Age of Onset   COPD Mother    Heart failure Mother    Diabetes Mother    Valvular heart disease Mother        mitral valve leakage   Heart attack Father 18       multiple heart attacks   Heart attack Sister 42   CAD Brother 35   Heart attack Maternal Grandfather    Heart attack Paternal Grandfather        multiple hearts attacks    Stroke Paternal Grandfather     Past Surgical History:  Procedure Laterality Date   CARDIAC CATHETERIZATION  11/18/1999   Percutaneous revascularization precedure with angioplasty to the proximal LAD and first diagonal    CESAREAN SECTION     CHOLECYSTECTOMY     CORONARY STENT PLACEMENT     KNEE ARTHROSCOPY WITH MEDIAL MENISECTOMY Left 05/06/2024   Procedure: LEFT KNEE ARTHROSCOPY WITH MEDIAL MENISCECTOMY;  Surgeon: Harden Jerona GAILS, MD;  Location: [facility] OR;  Service: Orthopedics;  Laterality: Left;   PARTIAL HYSTERECTOMY     TONSILLECTOMY  11/17/1958   Social History   Occupational History   Not on file  Tobacco Use   Smoking status: Never   Smokeless tobacco: Never  Vaping Use   Vaping status: Never Used  Substance and Sexual Activity   Alcohol use: No    Alcohol/week: 0.0 standard drinks of alcohol   Drug use: No   Sexual activity: Not on file

## 2024-08-03 ENCOUNTER — Encounter: Admitting: Rehabilitative and Restorative Service Providers"

## 2024-08-04 ENCOUNTER — Encounter: Payer: Self-pay | Admitting: Cardiovascular Disease

## 2024-08-08 ENCOUNTER — Encounter: Payer: Self-pay | Admitting: Rehabilitative and Restorative Service Providers"

## 2024-08-08 ENCOUNTER — Ambulatory Visit (INDEPENDENT_AMBULATORY_CARE_PROVIDER_SITE_OTHER): Admitting: Rehabilitative and Restorative Service Providers"

## 2024-08-08 DIAGNOSIS — R262 Difficulty in walking, not elsewhere classified: Secondary | ICD-10-CM

## 2024-08-08 DIAGNOSIS — R600 Localized edema: Secondary | ICD-10-CM

## 2024-08-08 DIAGNOSIS — M6281 Muscle weakness (generalized): Secondary | ICD-10-CM | POA: Diagnosis not present

## 2024-08-08 DIAGNOSIS — M25562 Pain in left knee: Secondary | ICD-10-CM | POA: Diagnosis not present

## 2024-08-08 NOTE — Therapy (Addendum)
 OUTPATIENT PHYSICAL THERAPY TREATMENT / DISCHARGE  Patient Name: Madison Mosley MRN: [id number] DOB:21-Aug-1951, 73 y.o., female Today's Date: 08/08/2024   END OF SESSION:  PT End of Session - 08/08/24 1145     Visit Number 15    Number of Visits 24    Date for Recertification  09/19/24    Authorization Type [employer] Medicare    Authorization Time Period 07/25/2024 - 08/22/2024    Authorization - Visit Number 4    Authorization - Number of Visits 4    Progress Note Due on Visit 15    PT Start Time 1142    PT Stop Time 1222    PT Time Calculation (min) 40 min    Activity Tolerance Patient tolerated treatment well    Behavior During Therapy WFL for tasks assessed/performed                 Past Medical History:  Diagnosis Date   Arthritis    CAD (coronary artery disease)    stents   GERD (gastroesophageal reflux disease)    Headache    Hyperlipidemia    Hypertension    Past Surgical History:  Procedure Laterality Date   CARDIAC CATHETERIZATION  11/18/1999   Percutaneous revascularization precedure with angioplasty to the proximal LAD and first diagonal    CESAREAN SECTION     CHOLECYSTECTOMY     CORONARY STENT PLACEMENT     KNEE ARTHROSCOPY WITH MEDIAL MENISECTOMY Left 05/06/2024   Procedure: LEFT KNEE ARTHROSCOPY WITH MEDIAL MENISCECTOMY;  Surgeon: Harden Jerona GAILS, MD;  Location: [facility] OR;  Service: Orthopedics;  Laterality: Left;   PARTIAL HYSTERECTOMY     TONSILLECTOMY  11/17/1958   Patient Active Problem List   Diagnosis Date Noted   Osteochondral defect of femoral condyle 05/06/2024   Right knee meniscal tear 10/02/2020   Unilateral primary osteoarthritis, right knee 04/25/2020   Low back pain 03/21/2020   Statin myopathy 11/16/2019   Atrophic vaginitis 06/18/2017   Sprain of calcaneofibular ligament of left ankle 01/16/2017   Obesity (BMI 30.0-34.9) 01/05/2015   CAD S/P percutaneous coronary angioplasty 04/21/2013   Hypercholesterolemia 04/21/2013    Essential hypertension 04/21/2013    PCP: Clarice Nottingham, MD   REFERRING PROVIDER: Harden Jerona GAILS, MD   REFERRING DIAG:  Diagnosis  [phone] (ICD-10-CM) - S/P left knee arthroscopy  M95.8 (ICD-10-CM) - Osteochondral defect of femoral condyle  M17.12 (ICD-10-CM) - Primary osteoarthritis of left knee    THERAPY DIAG:  Acute pain of left knee  Localized edema  Difficulty in walking, not elsewhere classified  Muscle weakness (generalized)  Rationale for Evaluation and Treatment: Rehabilitation  ONSET DATE: 05/06/24  SUBJECTIVE:   SUBJECTIVE STATEMENT: Pt indicated going to beach.  Indicated car ride with stiffness noted but was taking medicine.  Pt indicated similar pain this morning as usual.   PERTINENT HISTORY: Cardiac cath, left knee arthroscopy 05/06/24   PAIN:  NPRS scale: upon arrival 2/10 Pain location: left knee Pain description: achy, throbbing with walking  Aggravating factors: walking, lying on her Rt side Relieving factors: changing positions, resting, ice  PRECAUTIONS: None  WEIGHT BEARING RESTRICTIONS: No  FALLS:  Has patient fallen in last 6 months? No  LIVING ENVIRONMENT: Lives with: lives with their family and lives with their spouse Lives in: House/apartment Stairs: Yes: External: 3 steps; none Has following equipment at home: Single point cane  OCCUPATION: retired  PLOF: Independent  PATIENT GOALS: walk without cane, stop hurting    OBJECTIVE:  DIAGNOSTIC FINDINGS: IMPRESSION: 04/08/24 Tricompartmental osteoarthrosis. Mild to moderate chondromalacia with moderate reactive joint effusion.   Moderate radial tear at the root of the medial meniscus with medial displacement of body. There is second likely radial tear at the junction the body anterior horn of the medial meniscus. See above for more detail.  PATIENT SURVEYS:  Patient-Specific Activity Scoring Scheme  0 represents "unable to perform." 10 represents "able to perform  at prior level. 0 1 2 3 4 5 6 7 8 9  10 (Date and Score)   Activity Eval  05/31/24  8i/18/2025 07/20/2024  1. Walk for exercise  4  4 4   2. Sit comfortably 4   5 3   3. steps 2 5 5   4. Standing from sitting 4 4 6   5.     Score 3.5 4.5 avg 4.5    Total score = sum of the activity scores/number of activities Minimum detectable change (90%CI) for average score = 2 points Minimum detectable change (90%CI) for single activity score = 3 points  COGNITION: 05/31/24 Overall cognitive status: WFL    SENSATION: 05/31/24 WFL  EDEMA:  07/25/2024: Lt 19.5 inches  , Rt 19.75 inch (worse compression last night.   05/31/24 Circumferential: Rt:   48.0 centimeters       Left:  51.5 centimeters   LOWER EXTREMITY ROM:   ROM Right Eval 05/31/24 Left Eval 05/31/24 Left 06/20/24 Lt 07/20/2024 Left 07/25/2024  Hip flexion       Hip extension       Hip abduction       Hip adduction       Hip internal rotation       Hip external rotation       Knee flexion 118 95 108 105 105 AROM in supine heel slide  Knee extension -4 0 0 10deg flexion with 8-9/10 pain -4 in supine active quad set  Ankle dorsiflexion       Ankle plantarflexion       Ankle inversion       Ankle eversion        (Blank rows = not tested)  LOWER EXTREMITY MMT:  MMT Right Eval 05/31/24 Sitting HHD Left Eval 05/31/24 Sitting HHD Left 06/23/2024 Lt  07/20/2024 Left 07/25/2024 Right 08/08/2024 Left 08/08/2024  Hip flexion 30.7 20.1  21.9     Hip extension         Hip abduction         Hip adduction         Hip internal rotation         Hip external rotation         Knee flexion 28.5 21.1  24.9, pain in popliteal fossa 2-3/10 4/5    Knee extension 33.3 22.9 30, 31 lbs 17.1, pain in popliteal fossa 8/10 4+/5 29, 31 lbs  38.5 lbs 4+/5 31.9,32.9 lbs  Ankle dorsiflexion         Ankle plantarflexion         Ankle inversion         Ankle eversion          (Blank rows = not tested)  GAIT: 07/25/2024: Independent ambulation with  decreased stance , toe off progression Lt leg.  Maintained knee flexion in stance. Antalgic gait noted.   05/31/24 Distance walked: clinic distance Assistive device utilized: None, cane for community amb and uneven surfaces and stairs Level of assistance: Complete Independence to modified independent Comments: antalgic gait  TODAY'S TREATMENT                                                                          DATE: 08/08/2024 TherEx:  UBE lvl 3.5 seat 13 for ROM - 10 mins   Seated Lt leg LAQ with end range pauses x 15 4 lbs   Discussion pre-hab  intervention typical for possible TKA candidates. Time spent in question and answer.  Also detailed strengthening mechanisms in exercise intervention with foucs on higher reps for strength gains vs. Heavier weight that may be painful.  Emphasis on HEP to include ROM intervention, NWB strengthening (LAQ, seated SLR, quad sets) as well as WB transfers, etc as able.    TherActivity (to improve stairs, squatting, transfers) Leg press double leg x 15 81 lbs  Leg press single leg 37 lbs 2 x 15, performed bilaterally     Took dynamometry data for knee extension bilaterally    TODAY'S TREATMENT                                                                          DATE: 08/01/2024 TherEx:  UBE lvl 3.0 seat 13 for ROM - 10 mins   Continued education on NWB strengthening (quad set, SLR, etc in HEP).  Incline board stretch 30 sec x 3 bilateral   TherActivity (to improve stairs, squatting, transfers) Leg press double leg x 15 81 lbs  Leg press single leg Lt 37 lbs x 15   Neuro Re-ed Tandem stance on foam 1 min x 1 bilateral with occasional HHA SLS with contralateral leg step over and back 9 inch x 10 bilateral with focus on balance control and knee flexion over hurdle.      TODAY'S TREATMENT                                                                          DATE: 07/27/2024 TherEx:  UBE lvl 3.0 seat 13 for ROM - 8.65mins with :10 second interval faster top of each minute.  Incline gastroc stretch 30 sec x 3 bilateral Seated Lt leg LAQ with end range pauses each direction 4 lb weight 2 x 15   TherActivity (to improve stairs, squatting, transfers) Leg press double leg x 20 75 lbs  Leg press single leg Lt 37 lbs x 15  Neuro Re-ed Standing alternating PF/DF ankle strategy x 15 each way with SBA and occasional HHA SLS with contralateral leg step over and back 6 inch hurdle with focus on knee flexion over hurdle x 10 bilateral with finger tip assist on bar.    TODAY'S TREATMENT  DATE: 07/25/24 TherEx:  UBE lvl 3.0 seat 13 for ROM - 10.5 mins Supine bridge 2 x 5 with movement into knee flexion as tolerated.  Supine Lt hip extension into table Lt leg 5 sec hold x 10  Supine Lt leg LAQ in 90 deg hip flexion x 10 AROM  Seated quad set with SLR Lt 2 x 10  Review of HEP in NWB activity to help swelling, mobility and strength.  Seated LAQ Lt leg x 15 with pauses into end range extension and flexion      TODAY'S TREATMENT                                                                          DATE: 07/22/24 TherEx:  Nustep level 3 for 8 minutes  Standing gastroc stretch on slant board 3x45s   TherAct:  Mini squats in parallel bars with bilat UE use 2x10  Calf raise into toe raise using bilat UE use for balance support 2x8  Neuro Re-Ed: Tandem stance on foam pad 3x30s each leg back with intermittent UE use for self-correction of lateral LOB  Standing taps to 2 step within parallel bars for UE use 2x10 tapping with each leg to strengthen single leg stance balance  One instance of Lt knee instability during single leg stance; patient endorses this occurs randomly during longer walks  with husband  Tap downs from 2 step with focus on eccentric control 1x8 with each leg, but increased pain and discomfort with Rt tap down   PATIENT EDUCATION:  07/04/2024 Education details: HEP update  Person educated: Patient Education method: Programmer, Multimedia, Demonstration, Verbal cues, and Handouts Education comprehension: verbalized understanding, returned demonstration, and verbal cues required  HOME EXERCISE PROGRAM: Access Code: [email or web address] URL: [email or web address] Date: 07/04/2024 Prepared by: Ozell Silvan  Exercises - Supine Bridge  - 2 x daily - 7 x weekly - 2 sets - 10 reps - 5 seconds hold - Supine Heel Slide with Strap  - 2 x daily - 7 x weekly - 2 sets - 10 reps - 3 seconds hold - Supine Knee Extension Strengthening  - 2 x daily - 7 x weekly - 2 sets - 10 reps - 5 seconds hold - Sit to Stand  - 2 x daily - 7 x weekly - 2 sets - 10 reps - Seated Quad Set (Mirrored)  - 3-5 x daily - 7 x weekly - 1 sets - 10 reps - 5 hold - Seated Knee Flexion AAROM (Mirrored)  - 2-3 x daily - 7 x weekly - 1 sets - 5 reps - 10-15 hold - Seated SLR  - 1-2 x daily - 7 x weekly - 1-2 sets - 10-15 reps - 2 hold - Lateral Step Down  - 1 x daily - 7 x weekly - 1-2 sets - 10 reps  ASSESSMENT:  CLINICAL IMPRESSION: At this time, Pt continued to present with Lt knee pain with muscle weakness, mobility deficits that impact daily activity, specifically WB loading.  Pt is in process of debating any plan for TKA interventions in future.  At this time, she would like to continue to work towards making most of presentation and functional activity tolerance. Strength analysis   OBJECTIVE  IMPAIRMENTS: decreased mobility, difficulty walking, decreased ROM, decreased strength, increased edema, and pain.   ACTIVITY LIMITATIONS: bending, standing, squatting, and sleeping  PARTICIPATION LIMITATIONS: shopping and community activity  PERSONAL FACTORS: 3+ comorbidities: see PMH are also affecting  patient's functional outcome.   REHAB POTENTIAL: Good  CLINICAL DECISION MAKING: Stable/uncomplicated  EVALUATION COMPLEXITY: Low   GOALS: Goals reviewed with patient? Yes  SHORT TERM GOALS: (target date for Short term goals are 3 weeks 06/21/2024)   1.  Patient will demonstrate independent use of home exercise program to maintain progress from in clinic treatments.  Goal status: Met  LONG TERM GOALS: (target dates for all long term goals are 8 weeks  09/19/2024 )   1. Patient will demonstrate/report pain at worst less than or equal to 2/10 to facilitate minimal limitation in daily activity secondary to pain symptoms.  Goal status: on going 08/08/2024  2. Patient will demonstrate independent use of home exercise program to facilitate ability to maintain/progress functional gains from skilled physical therapy services.  Goal status: on going 08/08/2024   3. Patient will demonstrate Patient specific functional scale avg > or = 5.5  to indicate reduced disability due to condition.   Goal status: on going 08/08/2024   4.  Patient will demonstrate left LE MMT 5/5, within 15% of Rt dynamometry without pain throughout to faciltiate usual transfers, stairs, squatting at PLOF for daily life.   Goal status: on going 08/08/2024   5.  Patient will demonstrate up and down stairs with single hand rail with reciprocal gait pattern.  Goal status: on going 08/08/2024   6.  Pt will be able to walk 1/2 mile with pain in her left knee of </= 2/10 for neighborhood walking with inclines and declines.  Goal status: on going 08/08/2024      PLAN:  PT FREQUENCY: 2x/week  PT DURATION: 8 weeks   (was only given 4 visits from 07/25/2024 - 08/22/2024)  PLANNED INTERVENTIONS: Can include 02853- PT Re-evaluation, 97110-Therapeutic exercises, 97530- Therapeutic activity, 97112- Neuromuscular re-education, 97535- Self Care, 97140- Manual therapy, [phone]- Gait training, [phone]- Orthotic Fit/training, [phone]-  Canalith repositioning, [id number]- Aquatic Therapy, [phone]- Electrical stimulation (unattended), [id number] Physical performance testing, 97016- Vasopneumatic device, [id number]- Ultrasound, [id number]- Traction (mechanical), [id number]- Ionotophoresis 4mg /ml Dexamethasone ,  79439 - Needle insertion w/o injection 1 or 2 muscles, 20561 - Needle insertion w/o injection 3 or more muscles.   Patient/Family education, Balance training, Stair training, Taping, Dry Needling, Joint mobilization, Joint manipulation, Spinal manipulation, Spinal mobilization, Scar mobilization, Vestibular training, Visual/preceptual remediation/compensation, DME instructions, Cryotherapy, and Moist heat.  All performed as medically necessary.  All included unless contraindicated  PLAN FOR NEXT SESSION:  [employer] Medicare recert upon return with updated measurements, LTGs, PSFS   Ozell Silvan, PT, DPT, OCS, ATC 08/08/24  12:24 PM     PHYSICAL THERAPY DISCHARGE SUMMARY  Visits from Start of Care: 15  Current functional level related to goals / functional outcomes: See note   Remaining deficits: See note   Education / Equipment: HEP  Patient goals were partially met. Patient is being discharged due to not returning since the last visit.  Ozell Silvan, PT, DPT, OCS, ATC 09/27/24  1:35 PM       Date of referral: 05/26/24 Referring provider: Harden Jerona GAILS, MD Referring diagnosis?  Z98.890 (ICD-10-CM) - S/P left knee arthroscopy  M95.8 (ICD-10-CM) - Osteochondral defect of femoral condyle  M17.12 (ICD-10-CM) - Primary osteoarthritis of left knee    Treatment diagnosis? (if different than referring  diagnosis) M25.562 , R26.2, M62.81, R60.0   What was this (referring dx) caused by? Surgery (Type: left knee arthroscopy with medial meniscectomy)  Lysle of Condition: Initial Onset (within last 3 months)   Laterality: Lt  Current Functional Measure Score: Patient Specific Functional Scale eval:  05/31/2024 avg 3.5    07/20/2024  update:  4.5 avg  Objective measurements identify impairments when they are compared to normal values, the uninvolved extremity, and prior level of function.  [x]  Yes  []  No  Objective assessment of functional ability: Moderate functional limitations   Briefly describe symptoms: Symptoms Lt knee noted post surgery.  Complaints noted in daily activity and WB activity.  Symptoms has improved but worsened over last few weeks insidiously.  Symptom worsening impacted functional strength, standing, walking and other daily activity. Symptoms reported consistently during the day with some variability in intensity.   How did symptoms start: Surgery   Average pain intensity:  Last 24 hours: 3/10  Past week: up to 7-8/10  How often does the pt experience symptoms? Constantly  How much have the symptoms interfered with usual daily activities? Quite a bit  How has condition changed since care began at this facility? No change  In general, how is the patients overall health? Good   BACK PAIN (STarT Back Screening Tool) No

## 2024-08-11 ENCOUNTER — Encounter

## 2024-08-15 ENCOUNTER — Encounter: Admitting: Rehabilitative and Restorative Service Providers"

## 2024-08-16 ENCOUNTER — Ambulatory Visit (INDEPENDENT_AMBULATORY_CARE_PROVIDER_SITE_OTHER): Admitting: Physician Assistant

## 2024-08-16 DIAGNOSIS — M1712 Unilateral primary osteoarthritis, left knee: Secondary | ICD-10-CM

## 2024-08-16 NOTE — Progress Notes (Unsigned)
 Office Visit Note   Patient: Madison Mosley           Date of Birth: May 01, 1951           MRN: [id number] Visit Date: 08/16/2024              Requested by: Clarice Nottingham, MD [address] [place],  [place] [address] PCP: Clarice Nottingham, MD  Chief Complaint  Patient presents with  . Left Knee - Routine Post Op    05/06/2024 left knee scope and meniscal repair       HPI: 73 y/o female who has had left knee pain and swelling since April of 2025.  She has had left knee steroid injections and PT.  She continues to do strengthening exercises from PT and has a recumbent bike for ROM and strengthening.    MRI was performed on 04/08/24 which showed Moderate radial tear at the root of the medial meniscus with medial displacement of body. There is second likely radial tear at the junction the body anterior horn of the medial meniscus.  As well as Tricompartmental osteoarthrosis. Mild to moderate chondromalacia with moderate reactive joint effusion.  She underwent left knee arthroscopy for medial Menisectomy.  This did not seem to improve her ROM or alleviate her pain.    Assessment & Plan: Visit Diagnoses: No diagnosis found.  Plan: ***  Follow-Up Instructions: No follow-ups on file.   Ortho Exam  Patient is alert, oriented, no adenopathy, well-dressed, normal affect, normal respiratory effort. ***    Imaging: No results found. No images are attached to the encounter.  Labs: No results found for: HGBA1C, ESRSEDRATE, CRP, LABURIC, REPTSTATUS, GRAMSTAIN, CULT, LABORGA   Lab Results  Component Value Date   ALBUMIN 3.2 (L) 05/06/2024    No results found for: MG No results found for: VD25OH  No results found for: PREALBUMIN    Latest Ref Rng & Units 05/06/2024    7:10 AM  CBC EXTENDED  WBC 4.0 - 10.5 K/uL 8.2   RBC 3.87 - 5.11 MIL/uL 4.01   Hemoglobin 12.0 - 15.0 g/dL 87.8   HCT 63.9 - 53.9 % 37.9   Platelets 150 - 400 K/uL 250    NEUT# 1.7 - 7.7 K/uL 5.3   Lymph# 0.7 - 4.0 K/uL 2.2      There is no height or weight on file to calculate BMI.  Orders:  No orders of the defined types were placed in this encounter.  No orders of the defined types were placed in this encounter.    Procedures: No procedures performed  Clinical Data: No additional findings.  ROS:  All other systems negative, except as noted in the HPI. Review of Systems  Objective: Vital Signs: There were no vitals taken for this visit.  Specialty Comments:  No specialty comments available.  PMFS History: Patient Active Problem List   Diagnosis Date Noted  . Osteochondral defect of femoral condyle 05/06/2024  . Right knee meniscal tear 10/02/2020  . Unilateral primary osteoarthritis, right knee 04/25/2020  . Low back pain 03/21/2020  . Statin myopathy 11/16/2019  . Atrophic vaginitis 06/18/2017  . Sprain of calcaneofibular ligament of left ankle 01/16/2017  . Obesity (BMI 30.0-34.9) 01/05/2015  . CAD S/P percutaneous coronary angioplasty 04/21/2013  . Hypercholesterolemia 04/21/2013  . Essential hypertension 04/21/2013   Past Medical History:  Diagnosis Date  . Arthritis   . CAD (coronary artery disease)    stents  . GERD (gastroesophageal reflux disease)   .  Headache   . Hyperlipidemia   . Hypertension     Family History  Problem Relation Age of Onset  . COPD Mother   . Heart failure Mother   . Diabetes Mother   . Valvular heart disease Mother        mitral valve leakage  . Heart attack Father 91       multiple heart attacks  . Heart attack Sister 11  . CAD Brother 42  . Heart attack Maternal Grandfather   . Heart attack Paternal Grandfather        multiple hearts attacks  . Stroke Paternal Grandfather     Past Surgical History:  Procedure Laterality Date  . CARDIAC CATHETERIZATION  11/18/1999   Percutaneous revascularization precedure with angioplasty to the proximal LAD and first diagonal   . CESAREAN  SECTION    . CHOLECYSTECTOMY    . CORONARY STENT PLACEMENT    . KNEE ARTHROSCOPY WITH MEDIAL MENISECTOMY Left 05/06/2024   Procedure: LEFT KNEE ARTHROSCOPY WITH MEDIAL MENISCECTOMY;  Surgeon: Harden Jerona GAILS, MD;  Location: [facility] OR;  Service: Orthopedics;  Laterality: Left;  . PARTIAL HYSTERECTOMY    . TONSILLECTOMY  11/17/1958   Social History   Occupational History  . Not on file  Tobacco Use  . Smoking status: Never  . Smokeless tobacco: Never  Vaping Use  . Vaping status: Never Used  Substance and Sexual Activity  . Alcohol use: No    Alcohol/week: 0.0 standard drinks of alcohol  . Drug use: No  . Sexual activity: Not on file

## 2024-08-17 ENCOUNTER — Encounter: Admitting: Rehabilitative and Restorative Service Providers"

## 2024-08-17 ENCOUNTER — Encounter: Payer: Self-pay | Admitting: Physician Assistant

## 2024-08-18 DIAGNOSIS — M1712 Unilateral primary osteoarthritis, left knee: Secondary | ICD-10-CM | POA: Diagnosis not present

## 2024-08-18 NOTE — Progress Notes (Addendum)
 Post operative total knee replacement   Ice to the knee multiple times a day.  Ice before and after you exercise.  You may be given a continuous ice machine while at the hospital to take home.  First you feel cold, pain, then numbness.  15-20 minuets at a time.    Home health Physical therapy first week to 2 weeks.  You will then go to out patient therapy for increased mobility and strengthening.      81 mg Aspirin  daily for 2 weeks post op  Staples will be removed between 2-3 weeks.      After a total knee arthroplasty (TKA), the goal is to   achieve functional knee range of motion (ROM), which   for most people is 120 of flexion     full extension (0)   to perform daily activities.      Recovery is gradual, with goals   for flexion often set at 90 by the end of week 1,   increasing to 100 by weeks 2-3, and approaching   110-120 within 4-6 weeks. Significant recovery is   generally achieved by 3-4 months, though some   improvement in ROM can continue.   Expected ROM Milestones  Full Extension (Straightening): The knee should be able to straighten fully (0).   Flexion (Bending):  By 1 week: Goal of at least 90.   By weeks 2-3: Goal of at least 100 and full extension.   By 4-6 weeks: Approaching or achieving 110-120.   By 3-4 months: 120 or more of flexion is a common target.   Factors Influencing ROM (range of motion)  Preoperative ROM: The amount of motion before surgery is a key predictor of post-operative range.   Soft Tissues: Tight tissues around the knee, particularly in the back of the knee, can limit bending.   Scar Tissue: Excessive scar tissue after surgery can lead to stiffness. You can massage the scar a week after the staples are removed with Shea butter or cocoa butter lotion to prevent scaring.  Patient Motivation and Adherence to Therapy: Consistent effort in physical therapy is crucial for regaining motion.  Inflammation:  Inflammation after surgery can temporarily reduce range of motion but should improve over time.  Patient-Specific Factors: Overweight individuals, people with diabetes, and those with pre-existing stiffness may have different recovery trajectories.  Important Considerations  Early Movement is Key: Working on bending the knee early is more important than strengthening at first, as it's harder to regain lost motion later.   Functional ROM: The ultimate goal is a ROM that allows comfortable daily activities, not necessarily the maximum possible motion.   Consult with Your Care Team: Follow the specific goals and timelines set by your surgeon and physical therapist, as they can monitor your progress and address any concerns.

## 2024-08-22 ENCOUNTER — Encounter: Admitting: Rehabilitative and Restorative Service Providers"

## 2024-08-24 ENCOUNTER — Encounter: Admitting: Rehabilitative and Restorative Service Providers"

## 2024-08-29 ENCOUNTER — Encounter: Admitting: Rehabilitative and Restorative Service Providers"

## 2024-08-31 ENCOUNTER — Encounter: Admitting: Rehabilitative and Restorative Service Providers"

## 2024-08-31 NOTE — Pre-Procedure Instructions (Signed)
 Surgical Instructions   Your procedure is scheduled on September 07, 2024. Report to [facility] Main Entrance A at 5:30 A.M., then check in with the Admitting office. Any questions or running late day of surgery: call [phone]  Questions prior to your surgery date: call [phone], Monday-Friday, 8am-4pm. If you experience any cold or flu symptoms such as cough, fever, chills, shortness of breath, etc. between now and your scheduled surgery, please notify us  at the above number.     Remember:  Do not eat after midnight the night before your surgery  You may drink clear liquids until 4:30 AM the morning of your surgery.   Clear liquids allowed are: Water, Non-Citrus Juices (without pulp), Carbonated Beverages, Clear Tea (no milk, honey, etc.), Black Coffee Only (NO MILK, CREAM OR POWDERED CREAMER of any kind), and Gatorade.    Take these medicines the morning of surgery with A SIP OF WATER: metoprolol succinate (TOPROL-XL)    May take these medicines IF NEEDED: HYDROcodone -acetaminophen  (NORCO/VICODIN)    Follow your surgeon's instructions on when to stop Aspirin .  If no instructions were given by your surgeon then you will need to call the office to get those instructions.     One week prior to surgery, STOP taking any Aleve, Naproxen, Ibuprofen, Motrin, Advil, Goody's, BC's, all herbal medications, fish oil, and non-prescription vitamins.                     Do NOT Smoke (Tobacco/Vaping) for 24 hours prior to your procedure.  If you use a CPAP at night, you may bring your mask/headgear for your overnight stay.   You will be asked to remove any contacts, glasses, piercing's, hearing aid's, dentures/partials prior to surgery. Please bring cases for these items if needed.    Patients discharged the day of surgery will not be allowed to drive home, and someone needs to stay with them for 24 hours.  SURGICAL WAITING ROOM VISITATION Patients may have no more than 2 support  people in the waiting area - these visitors may rotate.   Pre-op nurse will coordinate an appropriate time for 1 ADULT support person, who may not rotate, to accompany patient in pre-op.  Children under the age of 20 must have an adult with them who is not the patient and must remain in the main waiting area with an adult.  If the patient needs to stay at the hospital during part of their recovery, the visitor guidelines for inpatient rooms apply.  Please refer to the [facility] website for the visitor guidelines for any additional information.   If you received a COVID test during your pre-op visit  it is requested that you wear a mask when out in public, stay away from anyone that may not be feeling well and notify your surgeon if you develop symptoms. If you have been in contact with anyone that has tested positive in the last 10 days please notify you surgeon.      Pre-operative 4 CHG Bathing Instructions   You can play a key role in reducing the risk of infection after surgery. Your skin needs to be as free of germs as possible. You can reduce the number of germs on your skin by washing with CHG (chlorhexidine  gluconate) soap before surgery. CHG is an antiseptic soap that kills germs and continues to kill germs even after washing.   DO NOT use if you have an allergy to chlorhexidine /CHG or antibacterial soaps. If your skin  becomes reddened or irritated, stop using the CHG and notify one of our RNs at [phone].   Please shower with the CHG soap starting 4 days before surgery using the following schedule:     Please keep in mind the following:  DO NOT shave, including legs and underarms, starting the day of your first shower.   You may shave your face at any point before/day of surgery.  Place clean sheets on your bed the day you start using CHG soap. Use a clean washcloth (not used since being washed) for each shower. DO NOT sleep with pets once you start using the CHG.   CHG  Shower Instructions:  Wash your face and private area with normal soap. If you choose to wash your hair, wash first with your normal shampoo.  After you use shampoo/soap, rinse your hair and body thoroughly to remove shampoo/soap residue.  Turn the water OFF and apply  bottle of CHG soap to a CLEAN washcloth.  Apply CHG soap ONLY FROM YOUR NECK DOWN TO YOUR TOES (washing for 3-5 minutes)  DO NOT use CHG soap on face, private areas, open wounds, or sores.  Pay special attention to the area where your surgery is being performed.  If you are having back surgery, having someone wash your back for you may be helpful. Wait 2 minutes after CHG soap is applied, then you may rinse off the CHG soap.  Pat dry with a clean towel  Put on clean clothes/pajamas   If you choose to wear lotion, please use ONLY the CHG-compatible lotions that are listed below.  Additional instructions for the day of surgery:  If you choose, you may shower the morning of surgery with an antibacterial soap.  DO NOT APPLY any lotions, deodorants, cologne, or perfumes.   Do not bring valuables to the hospital. [facility] is not responsible for any belongings/valuables. Do not wear nail polish, gel polish, artificial nails, or any other type of covering on natural nails (fingers and toes) Do not wear jewelry or makeup Put on clean/comfortable clothes.  Please brush your teeth.  Ask your nurse before applying any prescription medications to the skin.     CHG Compatible Lotions   Aveeno Moisturizing lotion  Cetaphil Moisturizing Cream  Cetaphil Moisturizing Lotion  Clairol Herbal Essence Moisturizing Lotion, Dry Skin  Clairol Herbal Essence Moisturizing Lotion, Extra Dry Skin  Clairol Herbal Essence Moisturizing Lotion, Normal Skin  Curel Age Defying Therapeutic Moisturizing Lotion with Alpha Hydroxy  Curel Extreme Care Body Lotion  Curel Soothing Hands Moisturizing Hand Lotion  Curel Therapeutic Moisturizing Cream,  Fragrance-Free  Curel Therapeutic Moisturizing Lotion, Fragrance-Free  Curel Therapeutic Moisturizing Lotion, Original Formula  Eucerin Daily Replenishing Lotion  Eucerin Dry Skin Therapy Plus Alpha Hydroxy Crme  Eucerin Dry Skin Therapy Plus Alpha Hydroxy Lotion  Eucerin Original Crme  Eucerin Original Lotion  Eucerin Plus Crme Eucerin Plus Lotion  Eucerin TriLipid Replenishing Lotion  Keri Anti-Bacterial Hand Lotion  Keri Deep Conditioning Original Lotion Dry Skin Formula Softly Scented  Keri Deep Conditioning Original Lotion, Fragrance Free Sensitive Skin Formula  Keri Lotion Fast Absorbing Fragrance Free Sensitive Skin Formula  Keri Lotion Fast Absorbing Softly Scented Dry Skin Formula  Keri Original Lotion  Keri Skin Renewal Lotion Keri Silky Smooth Lotion  Keri Silky Smooth Sensitive Skin Lotion  Nivea Body Creamy Conditioning Oil  Nivea Body Extra Enriched Teacher, adult education Moisturizing Lotion Nivea Crme  Nivea Skin Firming Lotion  NutraDerm 30 Skin Lotion  NutraDerm Skin Lotion  NutraDerm Therapeutic Skin Cream  NutraDerm Therapeutic Skin Lotion  ProShield Protective Hand Cream  Provon moisturizing lotion  Please read over the following fact sheets that you were given.

## 2024-09-01 ENCOUNTER — Encounter (HOSPITAL_COMMUNITY)
Admission: RE | Admit: 2024-09-01 | Discharge: 2024-09-01 | Disposition: A | Discharge status: Home / Self Care | Source: Ambulatory Visit | Attending: Orthopedic Surgery | Admitting: Orthopedic Surgery

## 2024-09-01 ENCOUNTER — Encounter (HOSPITAL_COMMUNITY): Payer: Self-pay

## 2024-09-01 ENCOUNTER — Other Ambulatory Visit: Payer: Self-pay

## 2024-09-01 VITALS — BP 154/74 | HR 68 | Temp 98.3°F | Resp 18 | Ht 68.0 in | Wt 221.2 lb

## 2024-09-01 DIAGNOSIS — Z01818 Encounter for other preprocedural examination: Secondary | ICD-10-CM

## 2024-09-01 DIAGNOSIS — D649 Anemia, unspecified: Secondary | ICD-10-CM | POA: Diagnosis not present

## 2024-09-01 DIAGNOSIS — I10 Essential (primary) hypertension: Secondary | ICD-10-CM | POA: Diagnosis not present

## 2024-09-01 DIAGNOSIS — I2510 Atherosclerotic heart disease of native coronary artery without angina pectoris: Secondary | ICD-10-CM | POA: Diagnosis not present

## 2024-09-01 DIAGNOSIS — Z01812 Encounter for preprocedural laboratory examination: Secondary | ICD-10-CM | POA: Insufficient documentation

## 2024-09-01 DIAGNOSIS — E785 Hyperlipidemia, unspecified: Secondary | ICD-10-CM | POA: Insufficient documentation

## 2024-09-01 DIAGNOSIS — Z79899 Other long term (current) drug therapy: Secondary | ICD-10-CM | POA: Diagnosis not present

## 2024-09-01 LAB — BASIC METABOLIC PANEL WITH GFR
Anion gap: 11 (ref 5–15)
BUN: 9 mg/dL (ref 8–23)
CO2: 27 mmol/L (ref 22–32)
Calcium: 8.9 mg/dL (ref 8.9–10.3)
Chloride: 98 mmol/L (ref 98–111)
Creatinine, Ser: 0.74 mg/dL (ref 0.44–1.00)
GFR, Estimated: >60 mL/min (ref >60)
Glucose, Bld: 103 mg/dL — ABNORMAL HIGH (ref 70–99)
Potassium: 3.3 mmol/L — ABNORMAL LOW (ref 3.5–5.1)
Sodium: 136 mmol/L (ref 135–145)

## 2024-09-01 LAB — CBC
HCT: 37.4 % (ref 36.0–46.0)
Hemoglobin: 11.9 g/dL — ABNORMAL LOW (ref 12.0–15.0)
MCH: 29 pg (ref 26.0–34.0)
MCHC: 31.8 g/dL (ref 30.0–36.0)
MCV: 91.2 fL (ref 80.0–100.0)
Platelets: 246 K/uL (ref 150–400)
RBC: 4.1 MIL/uL (ref 3.87–5.11)
RDW: 15.4 % (ref 11.5–15.5)
WBC: 8.2 K/uL (ref 4.0–10.5)
nRBC: 0 % (ref 0.0–0.2)

## 2024-09-01 LAB — SURGICAL PCR SCREEN
MRSA, PCR: NEGATIVE
Staphylococcus aureus: NEGATIVE

## 2024-09-01 NOTE — Progress Notes (Signed)
 PCP - Dr. Ryan Hives MD Cardiologist - Dr. Jerel Croitoru LOV 05-08-22 f/u 1 yr; not seen.  Patient is calling office to move her appt to be prior to surgery.   PPM/ICD - Denies Device Orders - n/a Rep Notified - n/a  Chest x-ray - n/a EKG - 05-06-24 Stress Test - 01-07-18 ECHO - 2001 Cardiac Cath - 03-17-2002   Sleep Study - Denies CPAP - n/a  NON-diabetic  Last dose of GLP1 agonist-  Denies GLP1 instructions: n/a  Blood Thinner Instructions: Denies Aspirin  Instructions: Per patient 09/02/24 is last day.   ERAS Protcol - clears until 0430 PRE-SURGERY Ensure or G2- none  COVID TEST- n/a   Anesthesia review: Yes, THN, CAD, 3 stents   Patient denies shortness of breath, fever, cough and chest pain at PAT appointment. Patient denies any respiratory issues at this time.    All instructions explained to the patient, with a verbal understanding of the material. Patient agrees to go over the instructions while at home for a better understanding. Patient also instructed to self quarantine after being tested for COVID-19. The opportunity to ask questions was provided.

## 2024-09-02 NOTE — Progress Notes (Signed)
 Anesthesia Chart Review:  73 year old female with history of early onset CAD (3 separate stent procedures from 2001-2002), HTN, HLD.  Last ischemic evaluation in 2019 was low risk.  Last seen in cardiology follow-up on 05/08/2022 and noted to be stable at that time, no anginal symptoms, no indication for ischemic evaluation.  She was continued on aspirin , metoprolol, Maxzide, pravastatin.   Seen by cardiology APP Cadence Franchester, PA-C on 09/05/24 for preop eval. Per note, Patient is scheduled for knee replacement in 2 days.  Patient had a meniscus surgery in June and has not improved since this time.  Plan for total knee replacement.  Patient is overall stable from a cardiac perspective.  Function has mildly decreased due to knee pain.  She reports weight gain as she has not been able to walk as much the last 6 months.  Patient denies chest pain, shortness of breath, leg edema, palpitations, lightheadedness, dizziness.  Patient is euvolemic on exam.  EKG shows normal sinus rhythm with 1 PVC.  Blood pressure is mildly elevated which may be due to to weight gain.  Preop labs showed mild hypokalemia (3.3)and mild anemia.  Recommend rechecking BMET prior to surgery.  Appears potassium has been low in the past-may need to reconsider Maxide.  Continue Toprol 25 mg daily and Maxide 37.5-25 mg daily-she sees Dr. JAYSON in December.  Can address lipid management at that time as well as it appears she is no longer on any medication.  Patient stopped aspirin  last Thursday in preparation for surgery.  No further cardiac workup prior to surgery.  Restart aspirin  postop per surgeon instructions. METs greater than 4. RCRI = 1.1% risk of Mace.  Preop labs reviewed, mild hypokalemia potassium 3.3, mild anemia with hemoglobin 11.9, otherwise unremarkable.  EKG 05/06/2024: Normal sinus rhythm.  Rate 71. Nonspecific ST abnormality   Nuclear stress 01/07/2018: Nuclear stress EF: 63%. Upsloping ST segment depression ST segment  depression was noted during stress in the II and III leads, and returning to baseline after less than 1 minute of recovery. Clinically and electrically negative for ischemia Normal perfusion Low risk study.    Lynwood Geofm RIGGERS [facility] Short Stay Center/Anesthesiology Phone [phone] 09/05/2024 2:23 PM

## 2024-09-05 ENCOUNTER — Ambulatory Visit: Attending: Medical | Admitting: Medical

## 2024-09-05 ENCOUNTER — Encounter: Payer: Self-pay | Admitting: Medical

## 2024-09-05 VITALS — BP 142/68 | HR 74 | Ht 68.0 in | Wt 222.2 lb

## 2024-09-05 DIAGNOSIS — E876 Hypokalemia: Secondary | ICD-10-CM

## 2024-09-05 DIAGNOSIS — Z01818 Encounter for other preprocedural examination: Secondary | ICD-10-CM

## 2024-09-05 DIAGNOSIS — E782 Mixed hyperlipidemia: Secondary | ICD-10-CM | POA: Diagnosis not present

## 2024-09-05 DIAGNOSIS — I2510 Atherosclerotic heart disease of native coronary artery without angina pectoris: Secondary | ICD-10-CM

## 2024-09-05 DIAGNOSIS — I10 Essential (primary) hypertension: Secondary | ICD-10-CM | POA: Diagnosis not present

## 2024-09-05 NOTE — Anesthesia Preprocedure Evaluation (Signed)
 Anesthesia Evaluation  Patient identified by MRN, date of birth, ID band Patient awake    Reviewed: Allergy & Precautions, NPO status , Patient's Chart, lab work & pertinent test results, reviewed documented beta blocker date and time   Airway Mallampati: III  TM Distance: >3 FB Neck ROM: Full    Dental no notable dental hx. (+) Teeth Intact, Dental Advisory Given   Pulmonary neg pulmonary ROS   Pulmonary exam normal breath sounds clear to auscultation       Cardiovascular hypertension, Pt. on home beta blockers and Pt. on medications + CAD and + Cardiac Stents  Normal cardiovascular exam Rhythm:Regular Rate:Normal   history of early onset CAD (3 separate stent procedures from 2001-2002), HTN, HLD.  Last ischemic evaluation in 2019 was low risk.  Last seen in cardiology follow-up on 05/08/2022 and noted to be stable at that time, no anginal symptoms, no indication for ischemic evaluation.  She was continued on aspirin , metoprolol, Maxzide, pravastatin  Last cardiac eval 08/2024- one PVC on EKG, chronically mild hypokalemia thought to be 2/2 diuretic (last K 3.3)  Nuclear stress 01/07/2018:  Nuclear stress EF: 63%.  Upsloping ST segment depression ST segment depression was noted during stress in the II and III leads, and returning to baseline after less than 1 minute of recovery.  Clinically and electrically negative for ischemia  Normal perfusion  Low risk study.      Neuro/Psych  Headaches  negative psych ROS   GI/Hepatic Neg liver ROS,GERD  Controlled,,  Endo/Other  Obesity BMI 34  Renal/GU negative Renal ROS  negative genitourinary   Musculoskeletal  (+) Arthritis , Osteoarthritis,    Abdominal  (+) + obese  Peds  Hematology negative hematology ROS (+) Hb 11.9, plt 246   Anesthesia Other Findings   Reproductive/Obstetrics negative OB ROS                              Anesthesia  Physical Anesthesia Plan  ASA: 3  Anesthesia Plan: Spinal, Regional and MAC   Post-op Pain Management: Regional block* and Tylenol  PO (pre-op)*   Induction:   PONV Risk Score and Plan: 2 and Propofol  infusion and TIVA  Airway Management Planned: Natural Airway and Nasal Cannula  Additional Equipment: None  Intra-op Plan:   Post-operative Plan:   Informed Consent: I have reviewed the patients History and Physical, chart, labs and discussed the procedure including the risks, benefits and alternatives for the proposed anesthesia with the patient or authorized representative who has indicated his/her understanding and acceptance.     Dental advisory given  Plan Discussed with: CRNA  Anesthesia Plan Comments:          Anesthesia Quick Evaluation

## 2024-09-05 NOTE — Progress Notes (Signed)
 Cardiology Office Note   Date:  09/05/2024  ID:  Tahiri, Shareef [birth year], MRN [id number] PCP: Clarice Nottingham, MD  [facility] Providers Cardiologist:  Jerel Balding, MD   History of Present Illness Madison Mosley is a 73 y.o. female with a h/o CAD (3 stent procedures 2001-2002) without need for revascularization in the last 20 years, HLD, hypertension with statin intolerance who presents for pre-operative visit.   The patient underwent a nuclear stress test in 12/2017 that was normal.   The patient was last seen 02/2021 and was stable from a cardiac perspective.   Today, the patient presents for a pre-op exam for a knee replacement. The patient reports meniscus surgery on June 20th and since then, knee has gotten worse.  She will get a knee replacement due to bone on bone. Patient can walk 1-2 blocks, up 1 flight of stairs, can do normal housework. She does have pain with this. She stopped ASA last Thursday.   From a cardiac perspective she has been doing well. No chest pain, SOB, LLE, orthopnea, pnd, lightheadedness, dizziness, palpitations.   Pre-op labs showed mild hypokalemia and anemia.  Studies Reviewed EKG Interpretation Date/Time:  Monday September 05 2024 08:20:04 EDT Ventricular Rate:  74 PR Interval:  154 QRS Duration:  84 QT Interval:  400 QTC Calculation: 444 R Axis:   10  Text Interpretation: Sinus rhythm with occasional Premature ventricular complexes Nonspecific ST abnormality When compared with ECG of 06-May-2024 06:55, Premature ventricular complexes are now Present Confirmed by Franchester, Jalexa Pifer ([id number]) on 09/05/2024 8:24:30 AM    MPI 12/2017 Nuclear stress EF: 63%. Upsloping ST segment depression ST segment depression was noted during stress in the II and III leads, and returning to baseline after less than 1 minute of recovery. Clinically and electrically negative for ischemia Normal perfusion Low risk study.       Physical Exam VS:  BP (!)  142/68 (BP Location: Left Arm, Patient Position: Sitting, Cuff Size: Large)   Pulse 74   Ht 5' 8 (1.727 m)   Wt 222 lb 3.2 oz (100.8 kg)   SpO2 98%   BMI 33.79 kg/m        Wt Readings from Last 3 Encounters:  09/05/24 222 lb 3.2 oz (100.8 kg)  09/01/24 221 lb 3.2 oz (100.3 kg)  05/06/24 210 lb (95.3 kg)    GEN: Well nourished, well developed in no acute distress NECK: No JVD; No carotid bruits CARDIAC: RRR, no murmurs, rubs, gallops RESPIRATORY:  Clear to auscultation without rales, wheezing or rhonchi  ABDOMEN: Soft, non-tender, non-distended EXTREMITIES:  No edema; No deformity   ASSESSMENT AND PLAN  Preoperative cardiac evaluation for knee replacement CAD with remote stenting Hypertension Hyperlipidemia with statin intolerance Mild hypokalemia Patient is scheduled for knee replacement in 2 days.  Patient had a meniscus surgery in June and has not improved since this time.  Plan for total knee replacement.  Patient is overall stable from a cardiac perspective.  Function has mildly decreased due to knee pain.  She reports weight gain as she has not been able to walk as much the last 6 months.  Patient denies chest pain, shortness of breath, leg edema, palpitations, lightheadedness, dizziness.  Patient is euvolemic on exam.  EKG shows normal sinus rhythm with 1 PVC.  Blood pressure is mildly elevated which may be due to to weight gain.  Preop labs showed mild hypokalemia (3.3)and mild anemia.  Recommend rechecking BMET prior to surgery.  Appears potassium has been low in the past-may need to reconsider Maxide.  Continue Toprol 25 mg daily and Maxide 37.5-25 mg daily-she sees Dr. JAYSON in December.  Can address lipid management at that time as well as it appears she is no longer on any medication.  Patient stopped aspirin  last Thursday in preparation for surgery.  No further cardiac workup prior to surgery.  Restart aspirin  postop per surgeon instructions. METs greater than 4 RCRI = 1.1%  risk of Mace.       Dispo: Follow-up in 2 months as scheduled  Signed, Gunther Zawadzki VEAR Fishman, PA-C

## 2024-09-05 NOTE — Patient Instructions (Addendum)
 Medication Instructions:  Your physician recommends that you continue on your current medications as directed. Please refer to the Current Medication list given to you today.   *If you need a refill on your cardiac medications before your next appointment, please call your pharmacy*  Lab Work: No labs ordered today  If you have labs (blood work) drawn today and your tests are completely normal, you will receive your results only by: MyChart Message (if you have MyChart) OR A paper copy in the mail If you have any lab test that is abnormal or we need to change your treatment, we will call you to review the results.  Testing/Procedures: No test ordered today   Follow-Up: At [facility], you and your health needs are our priority.  As part of our continuing mission to provide you with exceptional heart care, our providers are all part of one team.  This team includes your primary Cardiologist (physician) and Advanced Practice Providers or APPs (Physician Assistants and Nurse Practitioners) who all work together to provide you with the care you need, when you need it.  Your next appointment:    October 21, 2024 at 8:40 AM   Provider:   Jerel Balding, MD    We recommend signing up for the patient portal called MyChart.  Sign up information is provided on this After Visit Summary.  MyChart is used to connect with patients for Virtual Visits (Telemedicine).  Patients are able to view lab/test results, encounter notes, upcoming appointments, etc.  Non-urgent messages can be sent to your provider as well.   To learn more about what you can do with MyChart, go to [email or web address].

## 2024-09-06 NOTE — H&P (Signed)
 TOTAL KNEE ADMISSION H&P  Patient is being admitted for left total knee arthroplasty.  Subjective:  Chief Complaint:left knee pain.  HPI: Madison Mosley, 73 y.o. female, has a history of pain and functional disability in the left knee due to arthritis and has failed non-surgical conservative treatments for greater than 12 weeks to includeNSAID's and/or analgesics, corticosteriod injections, flexibility and strengthening excercises, and use of assistive devices.  Onset of symptoms was gradual, starting 2 years ago with gradually worsening course since that time. The patient noted prior procedures on the knee to include  arthroscopy and menisectomy on the left knee(s).  Patient currently rates pain in the left knee(s) at 8 out of 10 with activity. Patient has night pain, worsening of pain with activity and weight bearing, crepitus, and joint swelling.  Patient has evidence of subchondral cysts and joint space narrowing by imaging studies.  There is no active infection.  Patient Active Problem List   Diagnosis Date Noted   Osteochondral defect of femoral condyle 05/06/2024   Right knee meniscal tear 10/02/2020   Unilateral primary osteoarthritis, right knee 04/25/2020   Low back pain 03/21/2020   Statin myopathy 11/16/2019   Atrophic vaginitis 06/18/2017   Sprain of calcaneofibular ligament of left ankle 01/16/2017   Obesity (BMI 30.0-34.9) 01/05/2015   CAD S/P percutaneous coronary angioplasty 04/21/2013   Hypercholesterolemia 04/21/2013   Essential hypertension 04/21/2013   Past Medical History:  Diagnosis Date   Arthritis    CAD (coronary artery disease)    stents   GERD (gastroesophageal reflux disease)    Headache    Hyperlipidemia    Hypertension     Past Surgical History:  Procedure Laterality Date   CARDIAC CATHETERIZATION  11/18/1999   Percutaneous revascularization precedure with angioplasty to the proximal LAD and first diagonal    CESAREAN SECTION     CHOLECYSTECTOMY      CORONARY STENT PLACEMENT     KNEE ARTHROSCOPY WITH MEDIAL MENISECTOMY Left 05/06/2024   Procedure: LEFT KNEE ARTHROSCOPY WITH MEDIAL MENISCECTOMY;  Surgeon: Harden Jerona GAILS, MD;  Location: [facility] OR;  Service: Orthopedics;  Laterality: Left;   PARTIAL HYSTERECTOMY     TONSILLECTOMY  11/17/1958    No current facility-administered medications for this encounter.   Current Outpatient Medications  Medication Sig Dispense Refill Last Dose/Taking   Ascorbic Acid (VITAMIN C PO) Take 500 mg by mouth 2 (two) times a week.   Taking   aspirin  EC 81 MG tablet Take 1 tablet (81 mg total) by mouth daily. 90 tablet 3 Taking   ELDERBERRY PO Take 1 capsule by mouth daily.   Taking   HYDROcodone -acetaminophen  (NORCO/VICODIN) 5-325 MG tablet Take 1 tablet by mouth every 6 (six) hours as needed for moderate pain (pain score 4-6). 30 tablet 0 Taking As Needed   Magnesium 250 MG TABS Take 250 mg by mouth 4 (four) times a week.   Taking   metoprolol succinate (TOPROL-XL) 50 MG 24 hr tablet Take 25 mg by mouth daily.   Taking   Multiple Vitamin (MULTIVITAMIN WITH MINERALS) TABS Take 1 tablet by mouth 4 (four) times a week.   Taking   Probiotic Product (PROBIOTIC PO) Take 1 capsule by mouth daily.   Taking   QUERCETIN PO Take 1 tablet by mouth daily. With bromelain   Taking   SPIRULINA PO Take 1 tablet by mouth daily.   Taking   triamcinolone  cream (KENALOG) 0.1 % Apply 1 Application topically as needed (itch).   Taking As  Needed   triamterene-hydrochlorothiazide (MAXZIDE-25) 37.5-25 MG per tablet Take 1 tablet by mouth daily.   Taking   zinc gluconate 50 MG tablet Take 50 mg by mouth 2 (two) times a week.   Taking   Allergies  Allergen Reactions   Crestor  [Rosuvastatin Calcium]     Arthralgia (Joint Pain)   Ezetimibe     Arthralgia (Joint Pain)   Pitavastatin     Arthralgia (Joint Pain)   Porcine (Pork) Protein-Containing Drug Products     Migraines    Welchol  [Colesevelam Hcl]     Arthralgia (Joint  Pain)   Zocor  [Simvastatin]     Arthralgia (Joint Pain)   Naproxen Itching and Rash    Palm of Hand    Social History   Tobacco Use   Smoking status: Never   Smokeless tobacco: Never  Substance Use Topics   Alcohol use: No    Alcohol/week: 0.0 standard drinks of alcohol    Family History  Problem Relation Age of Onset   COPD Mother    Heart failure Mother    Diabetes Mother    Valvular heart disease Mother        mitral valve leakage   Heart attack Father 28       multiple heart attacks   Heart attack Sister 44   CAD Brother 97   Heart attack Maternal Grandfather    Heart attack Paternal Grandfather        multiple hearts attacks   Stroke Paternal Grandfather      Review of Systems  All other systems reviewed and are negative.   Objective:  Physical Exam Patient is alert, oriented, no adenopathy, well-dressed, normal affect, normal respiratory effort. General no acute distress Antalgic gait. Palpable DP pulse, multiple telangectasia veins surrounding her ankle. -10 degrees of full extension and 85 degrees of flexion on the left knee. Medial joint line pain. Patella crepitus with active flexion and extension to palpation.  Left knee edema without flutuance.  Vital signs in last 24 hours:    Labs:   Estimated body mass index is 33.79 kg/m as calculated from the following:   Height as of 09/05/24: 5' 8 (1.727 m).   Weight as of 09/05/24: 100.8 kg.   Imaging Review Plain radiographs demonstrate severe degenerative joint disease of the left knee(s). The overall alignment ismild varus. The bone quality appears to be good for age and reported activity level.      Assessment/Plan:  End stage arthritis, left knee   The patient history, physical examination, clinical judgment of the provider and imaging studies are consistent with end stage degenerative joint disease of the left knee(s) and total knee arthroplasty is deemed medically necessary. The treatment  options including medical management, injection therapy arthroscopy and arthroplasty were discussed at length. The risks and benefits of total knee arthroplasty were presented and reviewed. The risks due to aseptic loosening, infection, stiffness, patella tracking problems, thromboembolic complications and other imponderables were discussed. The patient acknowledged the explanation, agreed to proceed with the plan and consent was signed. Patient is being admitted for inpatient treatment for surgery, pain control, PT, OT, prophylactic antibiotics, VTE prophylaxis, progressive ambulation and ADL's and discharge planning. The patient is planning to be discharged home with home health services     Anticipated LOS equal to or greater than 2 midnights due to - Age 38 and older with one or more of the following:  - Obesity  - Expected need for hospital  services (PT, OT, Nursing) required for safe  discharge  - Anticipated need for postoperative skilled nursing care or inpatient rehab  - Active co-morbidities: Coronary Artery Disease OR   - Unanticipated findings during/Post Surgery: None  - Patient is a high risk of re-admission due to: None

## 2024-09-07 ENCOUNTER — Ambulatory Visit (HOSPITAL_COMMUNITY): Admitting: Physician Assistant

## 2024-09-07 ENCOUNTER — Other Ambulatory Visit: Payer: Self-pay

## 2024-09-07 ENCOUNTER — Ambulatory Visit (HOSPITAL_COMMUNITY)
Admission: RE | Admit: 2024-09-07 | Discharge: 2024-09-08 | Disposition: A | Discharge status: Home / Self Care | Attending: Orthopedic Surgery | Admitting: Orthopedic Surgery

## 2024-09-07 ENCOUNTER — Telehealth: Payer: Self-pay | Admitting: *Deleted

## 2024-09-07 ENCOUNTER — Encounter (HOSPITAL_COMMUNITY)
Admission: RE | Disposition: A | Discharge status: Home / Self Care | Payer: Self-pay | Source: Home / Self Care | Attending: Orthopedic Surgery

## 2024-09-07 ENCOUNTER — Encounter (HOSPITAL_COMMUNITY): Payer: Self-pay | Admitting: Orthopedic Surgery

## 2024-09-07 DIAGNOSIS — Z6833 Body mass index (BMI) 33.0-33.9, adult: Secondary | ICD-10-CM | POA: Insufficient documentation

## 2024-09-07 DIAGNOSIS — M1712 Unilateral primary osteoarthritis, left knee: Secondary | ICD-10-CM | POA: Diagnosis present

## 2024-09-07 DIAGNOSIS — I2510 Atherosclerotic heart disease of native coronary artery without angina pectoris: Secondary | ICD-10-CM | POA: Diagnosis not present

## 2024-09-07 DIAGNOSIS — E7800 Pure hypercholesterolemia, unspecified: Secondary | ICD-10-CM

## 2024-09-07 DIAGNOSIS — E669 Obesity, unspecified: Secondary | ICD-10-CM | POA: Diagnosis not present

## 2024-09-07 DIAGNOSIS — R519 Headache, unspecified: Secondary | ICD-10-CM | POA: Insufficient documentation

## 2024-09-07 DIAGNOSIS — Z79899 Other long term (current) drug therapy: Secondary | ICD-10-CM | POA: Insufficient documentation

## 2024-09-07 DIAGNOSIS — G8918 Other acute postprocedural pain: Secondary | ICD-10-CM | POA: Diagnosis not present

## 2024-09-07 DIAGNOSIS — I10 Essential (primary) hypertension: Secondary | ICD-10-CM

## 2024-09-07 DIAGNOSIS — Z7982 Long term (current) use of aspirin: Secondary | ICD-10-CM | POA: Diagnosis not present

## 2024-09-07 DIAGNOSIS — K219 Gastro-esophageal reflux disease without esophagitis: Secondary | ICD-10-CM | POA: Diagnosis not present

## 2024-09-07 DIAGNOSIS — Z955 Presence of coronary angioplasty implant and graft: Secondary | ICD-10-CM | POA: Diagnosis not present

## 2024-09-07 HISTORY — PX: TOTAL KNEE ARTHROPLASTY: SHX125

## 2024-09-07 SURGERY — ARTHROPLASTY, KNEE, TOTAL
Anesthesia: Monitor Anesthesia Care | Site: Knee | Laterality: Left

## 2024-09-07 MED ORDER — MAGNESIUM OXIDE -MG SUPPLEMENT 400 (240 MG) MG PO TABS
200.0000 mg | ORAL_TABLET | ORAL | Status: DC
  Administered 2024-09-08: 200 mg via ORAL
  Filled 2024-09-07: qty 1

## 2024-09-07 MED ORDER — PROPOFOL 10 MG/ML IV BOLUS
INTRAVENOUS | Status: DC | PRN
  Administered 2024-09-07 (×2): 20 mg via INTRAVENOUS

## 2024-09-07 MED ORDER — ONDANSETRON HCL 4 MG/2ML IJ SOLN: 4.0000 mg | Freq: Once | INTRAMUSCULAR | Status: DC | PRN

## 2024-09-07 MED ORDER — ASPIRIN 81 MG PO TBEC: 81.0000 mg | DELAYED_RELEASE_TABLET | Freq: Every day | ORAL | Status: DC

## 2024-09-07 MED ORDER — OXYCODONE HCL 5 MG PO TABS
ORAL_TABLET | ORAL | Status: AC
  Filled 2024-09-07: qty 1

## 2024-09-07 MED ORDER — ONDANSETRON HCL 4 MG/2ML IJ SOLN: 4.0000 mg | Freq: Four times a day (QID) | INTRAMUSCULAR | Status: DC | PRN

## 2024-09-07 MED ORDER — MIDAZOLAM HCL (PF) 2 MG/2ML IJ SOLN
INTRAMUSCULAR | Status: DC | PRN
  Administered 2024-09-07 (×2): 1 mg via INTRAVENOUS

## 2024-09-07 MED ORDER — DEXAMETHASONE SOD PHOSPHATE PF 10 MG/ML IJ SOLN
INTRAMUSCULAR | Status: DC | PRN
Start: 2024-09-07 — End: 2024-09-07
  Administered 2024-09-07: 10 mg via INTRAVENOUS

## 2024-09-07 MED ORDER — BUPIVACAINE IN DEXTROSE 0.75-8.25 % IT SOLN
INTRATHECAL | Status: DC | PRN
  Administered 2024-09-07: 1.6 mL via INTRATHECAL

## 2024-09-07 MED ORDER — HYDROMORPHONE HCL 1 MG/ML IJ SOLN
0.2500 mg | INTRAMUSCULAR | Status: DC | PRN
  Administered 2024-09-07 (×2): 0.5 mg via INTRAVENOUS

## 2024-09-07 MED ORDER — LACTATED RINGERS IV SOLN: INTRAVENOUS | Status: DC

## 2024-09-07 MED ORDER — ASPIRIN 81 MG PO CHEW
81.0000 mg | CHEWABLE_TABLET | Freq: Two times a day (BID) | ORAL | Status: DC
  Administered 2024-09-07 – 2024-09-08 (×2): 81 mg via ORAL
  Filled 2024-09-07 (×2): qty 1

## 2024-09-07 MED ORDER — DOCUSATE SODIUM 100 MG PO CAPS
100.0000 mg | ORAL_CAPSULE | Freq: Two times a day (BID) | ORAL | Status: DC
Start: 2024-09-07 — End: 2024-09-08
  Administered 2024-09-07 – 2024-09-08 (×2): 100 mg via ORAL
  Filled 2024-09-07 (×2): qty 1

## 2024-09-07 MED ORDER — DEXAMETHASONE SOD PHOSPHATE PF 10 MG/ML IJ SOLN
INTRAMUSCULAR | Status: DC | PRN
  Administered 2024-09-07: 10 mg via PERINEURAL

## 2024-09-07 MED ORDER — ONDANSETRON HCL 4 MG PO TABS: 4.0000 mg | ORAL_TABLET | Freq: Four times a day (QID) | ORAL | Status: DC | PRN

## 2024-09-07 MED ORDER — ONDANSETRON HCL 4 MG/2ML IJ SOLN
INTRAMUSCULAR | Status: DC | PRN
  Administered 2024-09-07: 4 mg via INTRAVENOUS

## 2024-09-07 MED ORDER — SODIUM CHLORIDE 0.9 % IV SOLN
INTRAVENOUS | Status: AC
Start: 2024-09-07 — End: 2024-09-08

## 2024-09-07 MED ORDER — TRIAMTERENE-HCTZ 37.5-25 MG PO TABS
1.0000 | ORAL_TABLET | Freq: Every day | ORAL | Status: DC
  Administered 2024-09-08: 1 via ORAL
  Filled 2024-09-07: qty 1

## 2024-09-07 MED ORDER — ROPIVACAINE HCL 5 MG/ML IJ SOLN
INTRAMUSCULAR | Status: DC | PRN
  Administered 2024-09-07: 30 mL via PERINEURAL

## 2024-09-07 MED ORDER — OXYCODONE HCL 5 MG PO TABS
5.0000 mg | ORAL_TABLET | Freq: Once | ORAL | Status: AC | PRN
  Administered 2024-09-07: 5 mg via ORAL

## 2024-09-07 MED ORDER — HYDROMORPHONE HCL 1 MG/ML IJ SOLN
0.5000 mg | INTRAMUSCULAR | Status: DC | PRN
  Administered 2024-09-07: 0.5 mg via INTRAVENOUS
  Filled 2024-09-07: qty 1

## 2024-09-07 MED ORDER — VITAMIN C 500 MG PO TABS
500.0000 mg | ORAL_TABLET | ORAL | Status: DC
  Administered 2024-09-08: 500 mg via ORAL
  Filled 2024-09-07: qty 1

## 2024-09-07 MED ORDER — PHENYLEPHRINE 80 MCG/ML (10ML) SYRINGE FOR IV PUSH (FOR BLOOD PRESSURE SUPPORT)
PREFILLED_SYRINGE | INTRAVENOUS | Status: DC | PRN
  Administered 2024-09-07 (×3): 80 ug via INTRAVENOUS

## 2024-09-07 MED ORDER — CHLORHEXIDINE GLUCONATE 0.12 % MT SOLN
15.0000 mL | Freq: Once | OROMUCOSAL | Status: AC
  Administered 2024-09-07: 15 mL via OROMUCOSAL
  Filled 2024-09-07: qty 15

## 2024-09-07 MED ORDER — ORAL CARE MOUTH RINSE: 15.0000 mL | Freq: Once | OROMUCOSAL | Status: AC

## 2024-09-07 MED ORDER — ONDANSETRON HCL 4 MG/2ML IJ SOLN
INTRAMUSCULAR | Status: AC
Start: 2024-09-07 — End: 2024-09-07
  Filled 2024-09-07: qty 2

## 2024-09-07 MED ORDER — METOCLOPRAMIDE HCL 5 MG/ML IJ SOLN: 5.0000 mg | Freq: Three times a day (TID) | INTRAMUSCULAR | Status: DC | PRN

## 2024-09-07 MED ORDER — AMISULPRIDE (ANTIEMETIC) 5 MG/2ML IV SOLN: 10.0000 mg | Freq: Once | INTRAVENOUS | Status: DC | PRN

## 2024-09-07 MED ORDER — HYDROMORPHONE HCL 1 MG/ML IJ SOLN
INTRAMUSCULAR | Status: AC
  Filled 2024-09-07: qty 1

## 2024-09-07 MED ORDER — MIDAZOLAM HCL 2 MG/2ML IJ SOLN
INTRAMUSCULAR | Status: AC
  Filled 2024-09-07: qty 2

## 2024-09-07 MED ORDER — VASHE WOUND IRRIGATION OPTIME
TOPICAL | Status: DC | PRN
  Administered 2024-09-07: 34 [oz_av]

## 2024-09-07 MED ORDER — OXYCODONE HCL 5 MG/5ML PO SOLN: 5.0000 mg | Freq: Once | ORAL | Status: AC | PRN

## 2024-09-07 MED ORDER — ACETAMINOPHEN 500 MG PO TABS
1000.0000 mg | ORAL_TABLET | Freq: Once | ORAL | Status: DC
  Filled 2024-09-07: qty 2

## 2024-09-07 MED ORDER — ACETAMINOPHEN 325 MG PO TABS: 325.0000 mg | ORAL_TABLET | Freq: Four times a day (QID) | ORAL | Status: DC | PRN

## 2024-09-07 MED ORDER — STERILE WATER FOR IRRIGATION IR SOLN
Status: DC | PRN
  Administered 2024-09-07 (×2): 1000 mL

## 2024-09-07 MED ORDER — OXYCODONE HCL 5 MG PO TABS
5.0000 mg | ORAL_TABLET | ORAL | Status: DC | PRN
  Filled 2024-09-07: qty 2

## 2024-09-07 MED ORDER — ACETAMINOPHEN 325 MG PO TABS
650.0000 mg | ORAL_TABLET | Freq: Once | ORAL | Status: AC
  Administered 2024-09-07: 650 mg via ORAL
  Filled 2024-09-07: qty 2

## 2024-09-07 MED ORDER — METOPROLOL SUCCINATE ER 25 MG PO TB24
25.0000 mg | ORAL_TABLET | Freq: Every day | ORAL | Status: DC
Start: 2024-09-08 — End: 2024-09-08
  Administered 2024-09-08: 25 mg via ORAL
  Filled 2024-09-07: qty 1

## 2024-09-07 MED ORDER — TRANEXAMIC ACID 1000 MG/10ML IV SOLN
2000.0000 mg | Freq: Once | INTRAVENOUS | Status: AC
  Administered 2024-09-07: 2000 mg via TOPICAL
  Filled 2024-09-07: qty 20

## 2024-09-07 MED ORDER — MENTHOL 3 MG MT LOZG: 1.0000 | LOZENGE | OROMUCOSAL | Status: DC | PRN

## 2024-09-07 MED ORDER — FENTANYL CITRATE (PF) 250 MCG/5ML IJ SOLN
INTRAMUSCULAR | Status: DC | PRN
Start: 2024-09-07 — End: 2024-09-07
  Administered 2024-09-07 (×2): 50 ug via INTRAVENOUS

## 2024-09-07 MED ORDER — PROPOFOL 500 MG/50ML IV EMUL
INTRAVENOUS | Status: DC | PRN
  Administered 2024-09-07: 50 ug/kg/min via INTRAVENOUS

## 2024-09-07 MED ORDER — FENTANYL CITRATE (PF) 100 MCG/2ML IJ SOLN
INTRAMUSCULAR | Status: AC
  Filled 2024-09-07: qty 2

## 2024-09-07 MED ORDER — METOCLOPRAMIDE HCL 5 MG PO TABS
5.0000 mg | ORAL_TABLET | Freq: Three times a day (TID) | ORAL | Status: DC | PRN
  Filled 2024-09-07: qty 2

## 2024-09-07 MED ORDER — ACETAMINOPHEN 500 MG PO TABS
1000.0000 mg | ORAL_TABLET | Freq: Four times a day (QID) | ORAL | Status: AC
  Administered 2024-09-07 – 2024-09-08 (×3): 1000 mg via ORAL
  Filled 2024-09-07 (×4): qty 2

## 2024-09-07 MED ORDER — CEFAZOLIN SODIUM-DEXTROSE 2-4 GM/100ML-% IV SOLN
2.0000 g | INTRAVENOUS | Status: AC
  Administered 2024-09-07: 2 g via INTRAVENOUS
  Filled 2024-09-07: qty 100

## 2024-09-07 MED ORDER — OXYCODONE HCL 5 MG PO TABS
10.0000 mg | ORAL_TABLET | ORAL | Status: DC | PRN
  Administered 2024-09-07 (×2): 10 mg via ORAL
  Administered 2024-09-08 (×3): 15 mg via ORAL
  Filled 2024-09-07: qty 2
  Filled 2024-09-07 (×3): qty 3

## 2024-09-07 MED ORDER — ZINC SULFATE 220 (50 ZN) MG PO CAPS
220.0000 mg | ORAL_CAPSULE | ORAL | Status: DC
  Administered 2024-09-08: 220 mg via ORAL
  Filled 2024-09-07: qty 1

## 2024-09-07 MED ORDER — PHENOL 1.4 % MT LIQD: 1.0000 | OROMUCOSAL | Status: DC | PRN

## 2024-09-07 SURGICAL SUPPLY — 44 items
BAG COUNTER SPONGE SURGICOUNT (BAG) ×1 IMPLANT
BLADE SAGITTAL 25.0X1.19X90 (BLADE) ×1 IMPLANT
BLADE SAW SGTL 13X75X1.27 (BLADE) ×1 IMPLANT
BLADE SURG 21 STRL SS (BLADE) ×2 IMPLANT
BNDG COHESIVE 6X5 TAN ST LF (GAUZE/BANDAGES/DRESSINGS) ×2 IMPLANT
BNDG GAUZE DERMACEA FLUFF 4 (GAUZE/BANDAGES/DRESSINGS) ×1 IMPLANT
BOWL SMART MIX CTS (DISPOSABLE) ×1 IMPLANT
COMPONENT FEM KNEE PS NRW 8 LT (Joint) IMPLANT
COMPONENT PATELLA 3 PEG 29X9 (Joint) IMPLANT
COMPONENT TIB 2 PEG SZ E LT KN (Knees) IMPLANT
COOLER ICEMAN CLASSIC (MISCELLANEOUS) ×1 IMPLANT
COVER SURGICAL LIGHT HANDLE (MISCELLANEOUS) ×1 IMPLANT
CUFF TOURN SGL QUICK 42 (TOURNIQUET CUFF) IMPLANT
CUFF TRNQT CYL 34X4.125X (TOURNIQUET CUFF) ×1 IMPLANT
DRAPE EXTREMITY T 121X128X90 (DISPOSABLE) ×1 IMPLANT
DRAPE HALF SHEET 40X57 (DRAPES) ×2 IMPLANT
DRAPE U-SHAPE 47X51 STRL (DRAPES) ×1 IMPLANT
DRSG ADAPTIC 3X8 NADH LF (GAUZE/BANDAGES/DRESSINGS) ×1 IMPLANT
DRSG AQUACEL AG ADV 3.5X14 (GAUZE/BANDAGES/DRESSINGS) IMPLANT
DURAPREP 26ML APPLICATOR (WOUND CARE) ×1 IMPLANT
ELECTRODE REM PT RTRN 9FT ADLT (ELECTROSURGICAL) ×1 IMPLANT
FACESHIELD WRAPAROUND OR TEAM (MASK) ×1 IMPLANT
GAUZE PAD ABD 8X10 STRL (GAUZE/BANDAGES/DRESSINGS) ×1 IMPLANT
GAUZE SPONGE 4X4 12PLY STRL (GAUZE/BANDAGES/DRESSINGS) ×1 IMPLANT
GLOVE BIOGEL PI IND STRL 9 (GLOVE) ×1 IMPLANT
GLOVE SURG ORTHO 9.0 STRL STRW (GLOVE) ×1 IMPLANT
GOWN STRL REUS W/ TWL XL LVL3 (GOWN DISPOSABLE) ×2 IMPLANT
KIT BASIN OR (CUSTOM PROCEDURE TRAY) ×1 IMPLANT
KIT TURNOVER KIT B (KITS) ×1 IMPLANT
MANIFOLD NEPTUNE II (INSTRUMENTS) ×1 IMPLANT
PACK TOTAL JOINT (CUSTOM PROCEDURE TRAY) ×1 IMPLANT
PAD ARMBOARD POSITIONER FOAM (MISCELLANEOUS) ×1 IMPLANT
PAD COLD SHLDR WRAP-ON (PAD) ×1 IMPLANT
PIN DRILL HDLS TROCAR 75 4PK (PIN) IMPLANT
SCREW FEMALE HEX FIX 25X2.5 (ORTHOPEDIC DISPOSABLE SUPPLIES) IMPLANT
SET HNDPC FAN SPRY TIP SCT (DISPOSABLE) ×1 IMPLANT
SOLN 0.9% NACL POUR BTL 1000ML (IV SOLUTION) ×1 IMPLANT
STAPLER SKIN PROX 35W (STAPLE) ×1 IMPLANT
STEM TIBIAL 10 8-11 EF POLY LT (Joint) IMPLANT
SUCTION TUBE FRAZIER 10FR DISP (SUCTIONS) IMPLANT
SUT VIC AB 0 CT1 27XBRD ANBCTR (SUTURE) ×1 IMPLANT
SUT VIC AB 1 CTX36XBRD ANBCTR (SUTURE) IMPLANT
TOWEL GREEN STERILE (TOWEL DISPOSABLE) ×1 IMPLANT
TOWEL GREEN STERILE FF (TOWEL DISPOSABLE) ×1 IMPLANT

## 2024-09-07 NOTE — Care Plan (Signed)
 [facility] RNCM call to patient to discuss her upcoming Left total knee arthroplasty with Dr. Harden at [facility] on 09/07/24. She is agreeable to case management. She lives with her spouse, who will be able to assist her at discharge to home. She will need a RW. Anticipate HHPT will be needed after a short hospital stay. Referral made to [facility] and after discussing with patient, she requested [facility] after choice provided. Referral sent and they accepted referral Reviewed all post op care instructions and questions answered. Will continue to follow for needs.

## 2024-09-07 NOTE — Anesthesia Postprocedure Evaluation (Signed)
 Anesthesia Post Note  Patient: Madison Mosley  Procedure(s) Performed: ARTHROPLASTY, KNEE, TOTAL LEFT (Left: Knee)     Patient location during evaluation: PACU Anesthesia Type: Regional and Spinal Level of consciousness: oriented and awake and alert Pain management: pain level controlled Vital Signs Assessment: post-procedure vital signs reviewed and stable Respiratory status: spontaneous breathing, respiratory function stable and patient connected to nasal cannula oxygen Cardiovascular status: blood pressure returned to baseline and stable Postop Assessment: no headache, no backache and no apparent nausea or vomiting Anesthetic complications: no   There were no known notable events for this encounter.  Last Vitals:  Vitals:   09/07/24 1200 09/07/24 1215  BP: (!) 143/64 96/61  Pulse: 62 67  Resp: 13 16  Temp:  36.7 C  SpO2: 99% 100%    Last Pain:  Vitals:   09/07/24 1218  TempSrc:   PainSc: 9                  Tal Neer L Sura Canul

## 2024-09-07 NOTE — Anesthesia Procedure Notes (Signed)
 Spinal  Patient location during procedure: OR Start time: 09/07/2024 7:34 AM End time: 09/07/2024 7:36 AM Reason for block: surgical anesthesia Staffing Performed: anesthesiologist  Anesthesiologist: Niels Marien CROME, MD Performed by: Niels Marien CROME, MD Authorized by: Niels Marien CROME, MD   Preanesthetic Checklist Completed: patient identified, IV checked, risks and benefits discussed, surgical consent, monitors and equipment checked, pre-op evaluation and timeout performed Spinal Block Patient position: sitting Prep: DuraPrep and site prepped and draped Patient monitoring: cardiac monitor, continuous pulse ox and blood pressure Approach: midline Location: L3-4 Injection technique: single-shot Needle Needle type: Pencan  Needle gauge: 24 G Needle length: 9 cm Assessment Sensory level: T6 Events: CSF return Additional Notes Functioning IV was confirmed and monitors were applied. Sterile prep and drape, including hand hygiene and sterile gloves were used. The patient was positioned and the spine was prepped. The skin was anesthetized with lidocaine .  Free flow of clear CSF was obtained prior to injecting local anesthetic into the CSF.  The spinal needle aspirated freely following injection.  The needle was carefully withdrawn.  The patient tolerated the procedure well.

## 2024-09-07 NOTE — Telephone Encounter (Signed)
 [facility] pre-op call completed.

## 2024-09-07 NOTE — Evaluation (Signed)
 Physical Therapy Evaluation Patient Details Name: Madison Mosley MRN: [id number] DOB: [birth year] Today's Date: 09/07/2024  History of Present Illness  73 y.o. female presents to [facility] hospital on 09/07/2024 for elective L TKA. PMH includes CAD, HTN, R meniscal tear.  Clinical Impression  Pt presents to PT with deficits in functional mobility, gait, balance, strength, ROM. Pt is able to ambulate for household distances with support of the RW. PT provides education on the TKR exercise packet and encourages frequent mobilization with staff assistance. PT will follow up tomorrow for a progression of gait and to initiate stair training.        If plan is discharge home, recommend the following: A little help with walking and/or transfers;A little help with bathing/dressing/bathroom;Assistance with cooking/housework;Assist for transportation;Help with stairs or ramp for entrance   Can travel by private vehicle        Equipment Recommendations Rolling walker (2 wheels);BSC/3in1  Recommendations for Other Services       Functional Status Assessment Patient has had a recent decline in their functional status and demonstrates the ability to make significant improvements in function in a reasonable and predictable amount of time.     Precautions / Restrictions Precautions Precautions: Fall;Knee Precaution Booklet Issued: Yes (comment) Recall of Precautions/Restrictions: Intact Restrictions Weight Bearing Restrictions Per Provider Order: Yes LLE Weight Bearing Per Provider Order: Weight bearing as tolerated      Mobility  Bed Mobility Overal bed mobility: Needs Assistance Bed Mobility: Supine to Sit, Sit to Supine     Supine to sit: Min assist Sit to supine: Min assist   General bed mobility comments: spouse assisting    Transfers Overall transfer level: Needs assistance Equipment used: Rolling walker (2 wheels) Transfers: Sit to/from Stand Sit to Stand: Min assist, From  elevated surface                Ambulation/Gait Ambulation/Gait assistance: Contact guard assist Gait Distance (Feet): 120 Feet Assistive device: Rolling walker (2 wheels) Gait Pattern/deviations: Step-to pattern Gait velocity: reduced Gait velocity interpretation: <1.31 ft/sec, indicative of household ambulator   General Gait Details: slowed step-to gait, reduced stance time on LLE, improving knee extension with progressive ambulation  Stairs            Wheelchair Mobility     Tilt Bed    Modified Rankin (Stroke Patients Only)       Balance Overall balance assessment: Needs assistance Sitting-balance support: No upper extremity supported, Feet supported Sitting balance-Leahy Scale: Good     Standing balance support: Single extremity supported, Reliant on assistive device for balance Standing balance-Leahy Scale: Poor                               Pertinent Vitals/Pain Pain Assessment Pain Assessment: Faces Faces Pain Scale: Hurts whole lot Pain Location: L knee Pain Descriptors / Indicators: Sore Pain Intervention(s): Monitored during session    Home Living Family/patient expects to be discharged to:: Private residence Living Arrangements: Spouse/significant other Available Help at Discharge: Family;Available 24 hours/day Type of Home: House Home Access: Stairs to enter Entrance Stairs-Rails: Right Entrance Stairs-Number of Steps: 3   Home Layout: One level Home Equipment: Cane - single point;Crutches;Shower seat - built in;Grab bars - tub/shower      Prior Function Prior Level of Function : Independent/Modified Independent;Driving             Mobility Comments: ambulatory with SPC  Extremity/Trunk Assessment   Upper Extremity Assessment Upper Extremity Assessment: Overall WFL for tasks assessed    Lower Extremity Assessment Lower Extremity Assessment: LLE deficits/detail LLE Deficits / Details: generalized  post-op ROM and strength deficits as anticipated on POD 0    Cervical / Trunk Assessment Cervical / Trunk Assessment: Normal  Communication   Communication Communication: No apparent difficulties    Cognition Arousal: Alert Behavior During Therapy: WFL for tasks assessed/performed   PT - Cognitive impairments: No apparent impairments                         Following commands: Intact       Cueing Cueing Techniques: Verbal cues     General Comments General comments (skin integrity, edema, etc.): VSS on RA    Exercises Other Exercises Other Exercises: PT provides education on TKR exercise packet   Assessment/Plan    PT Assessment Patient needs continued PT services  PT Problem List Decreased strength;Decreased activity tolerance;Decreased balance;Decreased mobility;Decreased knowledge of use of DME;Pain       PT Treatment Interventions DME instruction;Gait training;Stair training;Therapeutic activities;Functional mobility training;Therapeutic exercise;Balance training;Neuromuscular re-education;Patient/family education    PT Goals (Current goals can be found in the Care Plan section)  Acute Rehab PT Goals Patient Stated Goal: to return to independence PT Goal Formulation: With patient Time For Goal Achievement: 09/11/24 Potential to Achieve Goals: Good    Frequency 7X/week     Co-evaluation               AM-PAC PT 6 Clicks Mobility  Outcome Measure Help needed turning from your back to your side while in a flat bed without using bedrails?: A Little Help needed moving from lying on your back to sitting on the side of a flat bed without using bedrails?: A Little Help needed moving to and from a bed to a chair (including a wheelchair)?: A Little Help needed standing up from a chair using your arms (e.g., wheelchair or bedside chair)?: A Little Help needed to walk in hospital room?: A Little Help needed climbing 3-5 steps with a railing? : A  Lot 6 Click Score: 17    End of Session Equipment Utilized During Treatment: Gait belt Activity Tolerance: Patient tolerated treatment well Patient left: in bed;with call bell/phone within reach;with bed alarm set Nurse Communication: Mobility status PT Visit Diagnosis: Other abnormalities of gait and mobility (R26.89);Muscle weakness (generalized) (M62.81);Pain Pain - Right/Left: Left Pain - part of body: Knee    Time: 1456-1535 PT Time Calculation (min) (ACUTE ONLY): 39 min   Charges:   PT Evaluation $PT Eval Low Complexity: 1 Low   PT General Charges $$ ACUTE PT VISIT: 1 Visit         Bernardino JINNY Ruth, PT, DPT Acute Rehabilitation Office [phone]   Bernardino JINNY Ruth 09/07/2024, 3:51 PM

## 2024-09-07 NOTE — Transfer of Care (Signed)
 Immediate Anesthesia Transfer of Care Note  Patient: Madison Mosley  Procedure(s) Performed: ARTHROPLASTY, KNEE, TOTAL LEFT (Left: Knee)  Patient Location: PACU  Anesthesia Type:MAC combined with regional for post-op pain  Level of Consciousness: awake, alert , and oriented  Airway & Oxygen Therapy: Patient Spontanous Breathing  Post-op Assessment: Report given to RN and Post -op Vital signs reviewed and stable  Post vital signs: Reviewed and stable  Last Vitals:  Vitals Value Taken Time  BP 144/68 09/07/24 09:15  Temp    Pulse 61 09/07/24 09:16  Resp 15 09/07/24 09:16  SpO2 100 % 09/07/24 09:16  Vitals shown include unfiled device data.  Last Pain:  Vitals:   09/07/24 0625  TempSrc:   PainSc: 3       Patients Stated Pain Goal: 3 (09/07/24 9374)  Complications: There were no known notable events for this encounter.

## 2024-09-07 NOTE — Anesthesia Procedure Notes (Signed)
 Anesthesia Regional Block: Adductor canal block   Pre-Anesthetic Checklist: , timeout performed,  Correct Patient, Correct Site, Correct Laterality,  Correct Procedure, Correct Position, site marked,  Risks and benefits discussed,  Pre-op evaluation,  At surgeon's request and post-op pain management  Laterality: Left  Prep: Maximum Sterile Barrier Precautions used, chloraprep       Needles:  Injection technique: Single-shot  Needle Type: Echogenic Stimulator Needle     Needle Length: 9cm  Needle Gauge: 21     Additional Needles:   Procedures:,,,, ultrasound used (permanent image in chart),,    Narrative:  Start time: 09/07/2024 7:10 AM End time: 09/07/2024 7:12 AM Injection made incrementally with aspirations every 5 mL. Anesthesiologist: Niels Marien CROME, MD

## 2024-09-07 NOTE — Interval H&P Note (Signed)
 History and Physical Interval Note:  09/07/2024 6:34 AM  Madison Mosley  has presented today for surgery, with the diagnosis of Osteoarthritis Left Knee.  The various methods of treatment have been discussed with the patient and family. After consideration of risks, benefits and other options for treatment, the patient has consented to  Procedure(s): ARTHROPLASTY, KNEE, TOTAL (Left) as a surgical intervention.  The patient's history has been reviewed, patient examined, no change in status, stable for surgery.  I have reviewed the patient's chart and labs.  Questions were answered to the patient's satisfaction.     [name]

## 2024-09-07 NOTE — Op Note (Signed)
 DATE OF SURGERY:  09/07/2024  TIME: 9:00 AM  PATIENT NAME:  Madison Mosley    AGE: 73 y.o.    PRE-OPERATIVE DIAGNOSIS:  Osteoarthritis Left Knee  POST-OPERATIVE DIAGNOSIS:  Osteoarthritis Left Knee  PROCEDURE:  Procedure(s): ARTHROPLASTY, KNEE, TOTAL LEFT  SURGEON: Jerona Sage  ASSISTANT: Joey Dixon  OPERATIVE IMPLANTS: Zimmer Persona  Femur size 8, Tibia size E  2- Peg fixed bearing, Patella size 29 1-peg oval button, with a 10 mm polyethylene insert medial congruent.  @ENCIMAGES @  Implant Name Type Inv. Item Serial No. Manufacturer Lot No. LRB No. Used Action  COMPONENT FEM KNEE PS NRW 8 LT - [id number] Joint COMPONENT FEM KNEE PS NRW 8 LT  [employer]MER [employer](ORTH,TRAU,BIO,SG) [id number] Left 1 Implanted  COMPONENT TIB 2 PEG SZ E LT KN - [id number] Knees COMPONENT TIB 2 PEG SZ E LT KN  [employer](ORTH,TRAU,BIO,SG) [id number] Left 1 Implanted  COMPONENT PATELLA 3 PEG 29X9 - [id number] Joint COMPONENT PATELLA 3 PEG 29X9  [employer](ORTH,TRAU,BIO,SG) [id number] Left 1 Implanted  STEM TIBIAL 10 8-11 EF POLY LT - [id number] Joint STEM TIBIAL 10 8-11 EF POLY LT  [employer](ORTH,TRAU,BIO,SG) [id number] Left 1 Implanted      PREOPERATIVE INDICATIONS:   Madison Mosley is a 73 y.o. year old female with end stage degenerative arthritis of the knee who failed conservative treatment and elected for Total Knee Arthroplasty.   The risks, benefits, and alternatives were discussed at length including but not limited to the risks of infection, bleeding, nerve injury, stiffness, blood clots, the need for revision surgery, cardiopulmonary complications, among others, and they were willing to proceed.  OPERATIVE DESCRIPTION:  The patient was brought to the operative room and placed in a supine position.  Anesthesia was administered.  IV antibiotics were given.  IV TXA was initiated.  The lower extremity was prepped and draped in the usual sterile fashion.  Ioban was used to cover all exposed skin.  Time out was performed.    Anterior quadriceps tendon splitting approach was performed.  The patella was everted and osteophytes were removed.  The anterior horn of the medial and lateral meniscus was removed.   The distal femur was opened with the drill and the intramedullary distal femoral cutting jig was utilized, set at 5 degrees valgus resecting 9 mm off the distal femur.  Care was taken to protect the collateral ligaments.  Then the extramedullary tibial cutting jig was utilized set for 3 degree posterior slope.  Care was taken during the cut to protect the medial and collateral ligaments.  The proximal tibia was removed along with the posterior horns of the menisci.  The PCL was sacrificed.    The extensor gap was measured and was approximately 10 mm.    The distal femoral sizing jig was applied, taking care to avoid notching.  Then the 4-in-1 cutting jig was applied and the anterior and posterior femur was cut, along with the chamfer cuts.  All posterior osteophytes were removed.  The flexion gap was then measured and was symmetric with the extension gap.  The distal femoral preparation using the appropriate jig to prepare the box.  The patella was then measured, and cut with the saw.    The proximal tibia sized and prepared accordingly with the reamer and the punch, and then all components were trialed with the poly insert.  The knee was found to have stable balance and full motion.  The knee was irrigated with normal saline, topical 2 g TXA was  used to soak the wound.  The above named components were then press fit into place.  The final polyethylene component was placed.  The knee was then taken through a range of motion and the patella tracked well and the knee irrigated copiously and the parapatellar and subcutaneous tissue closed with vicryl, and skin closed with staples..  A sterile dressing was applied and patient  was taken to the PACU in stable  condition.  There were no  complications.  Total tourniquet time was 30 minutes.

## 2024-09-08 ENCOUNTER — Encounter (HOSPITAL_COMMUNITY): Payer: Self-pay | Admitting: Orthopedic Surgery

## 2024-09-08 ENCOUNTER — Telehealth: Payer: Self-pay | Admitting: Orthopedic Surgery

## 2024-09-08 ENCOUNTER — Other Ambulatory Visit: Payer: Self-pay | Admitting: *Deleted

## 2024-09-08 DIAGNOSIS — Z79899 Other long term (current) drug therapy: Secondary | ICD-10-CM | POA: Diagnosis not present

## 2024-09-08 DIAGNOSIS — M1712 Unilateral primary osteoarthritis, left knee: Secondary | ICD-10-CM

## 2024-09-08 DIAGNOSIS — Z955 Presence of coronary angioplasty implant and graft: Secondary | ICD-10-CM | POA: Diagnosis not present

## 2024-09-08 DIAGNOSIS — I10 Essential (primary) hypertension: Secondary | ICD-10-CM | POA: Diagnosis not present

## 2024-09-08 DIAGNOSIS — Z96652 Presence of left artificial knee joint: Secondary | ICD-10-CM

## 2024-09-08 DIAGNOSIS — K219 Gastro-esophageal reflux disease without esophagitis: Secondary | ICD-10-CM | POA: Diagnosis not present

## 2024-09-08 DIAGNOSIS — R519 Headache, unspecified: Secondary | ICD-10-CM | POA: Diagnosis not present

## 2024-09-08 DIAGNOSIS — E7800 Pure hypercholesterolemia, unspecified: Secondary | ICD-10-CM | POA: Diagnosis not present

## 2024-09-08 DIAGNOSIS — I2510 Atherosclerotic heart disease of native coronary artery without angina pectoris: Secondary | ICD-10-CM | POA: Diagnosis not present

## 2024-09-08 DIAGNOSIS — Z7982 Long term (current) use of aspirin: Secondary | ICD-10-CM | POA: Diagnosis not present

## 2024-09-08 LAB — CBC
HCT: 31.6 % — ABNORMAL LOW (ref 36.0–46.0)
Hemoglobin: 10.1 g/dL — ABNORMAL LOW (ref 12.0–15.0)
MCH: 29.3 pg (ref 26.0–34.0)
MCHC: 32 g/dL (ref 30.0–36.0)
MCV: 91.6 fL (ref 80.0–100.0)
Platelets: 278 K/uL (ref 150–400)
RBC: 3.45 MIL/uL — ABNORMAL LOW (ref 3.87–5.11)
RDW: 15.4 % (ref 11.5–15.5)
WBC: 14.1 K/uL — ABNORMAL HIGH (ref 4.0–10.5)
nRBC: 0 % (ref 0.0–0.2)

## 2024-09-08 LAB — BASIC METABOLIC PANEL WITH GFR
Anion gap: 11 (ref 5–15)
BUN: 12 mg/dL (ref 8–23)
CO2: 26 mmol/L (ref 22–32)
Calcium: 8.6 mg/dL — ABNORMAL LOW (ref 8.9–10.3)
Chloride: 99 mmol/L (ref 98–111)
Creatinine, Ser: 0.92 mg/dL (ref 0.44–1.00)
GFR, Estimated: >60 mL/min (ref >60)
Glucose, Bld: 120 mg/dL — ABNORMAL HIGH (ref 70–99)
Potassium: 3.5 mmol/L (ref 3.5–5.1)
Sodium: 136 mmol/L (ref 135–145)

## 2024-09-08 MED ORDER — OXYCODONE HCL 10 MG PO TABS: 5.0000 mg | ORAL_TABLET | ORAL | 0 refills | Status: DC | PRN

## 2024-09-08 MED ORDER — ONDANSETRON HCL 4 MG PO TABS: 4.0000 mg | ORAL_TABLET | Freq: Three times a day (TID) | ORAL | 0 refills | Status: DC | PRN

## 2024-09-08 NOTE — Telephone Encounter (Signed)
 Called and left patient detailed message that she can pick up her handicap application form at the office. It has been signed today

## 2024-09-08 NOTE — Telephone Encounter (Signed)
 Evan from [facility] called stating they received a referral for pt but due to over capacity they have no opens and apologies. Evan number [phone].

## 2024-09-08 NOTE — Discharge Instructions (Addendum)
 Post operative total knee replacement     Ice to the knee multiple times a day.  Ice before and after you exercise.  You may be given a continuous ice machine while at the hospital to take home.  First you feel cold, pain, then numbness.  15-20 minuets at a time.     Home health Physical therapy first week to 2 weeks.  You will then go to out patient therapy for increased mobility and strengthening.       81 mg Aspirin  daily for 2 weeks post op   Staples will be removed between 2-3 weeks.         After a total knee arthroplasty (TKA), the goal is to    achieve functional knee range of motion (ROM), which    for most people is 120 of flexion       full extension (0)    to perform daily activities.         Recovery is gradual, with goals    for flexion often set at 90 by the end of week 1,    increasing to 100 by weeks 2-3, and approaching    110-120 within 4-6 weeks. Significant recovery is    generally achieved by 3-4 months, though some    improvement in ROM can continue.    Expected ROM Milestones   Full Extension (Straightening): The knee should be able to straighten fully (0).    Flexion (Bending):   By 1 week: Goal of at least 90.    By weeks 2-3: Goal of at least 100 and full extension.    By 4-6 weeks: Approaching or achieving 110-120.    By 3-4 months: 120 or more of flexion is a common target.    Factors Influencing ROM (range of motion)   Preoperative ROM: The amount of motion before surgery is a key predictor of post-operative range.    Soft Tissues: Tight tissues around the knee, particularly in the back of the knee, can limit bending.    Scar Tissue: Excessive scar tissue after surgery can lead to stiffness. You can massage the scar a week after the staples are removed with Shea butter or cocoa butter lotion to prevent scaring.   Patient Motivation and Adherence to Therapy: Consistent effort in physical therapy is crucial for  regaining motion.  Inflammation: Inflammation after surgery can temporarily reduce range of motion but should improve over time.  Patient-Specific Factors: Overweight individuals, people with diabetes, and those with pre-existing stiffness may have different recovery trajectories.  Important Considerations   Early Movement is Key: Working on bending the knee early is more important than strengthening at first, as it's harder to regain lost motion later.    Functional ROM: The ultimate goal is a ROM that allows comfortable daily activities, not necessarily the maximum possible motion.    Consult with Your Care Team: Follow the specific goals and timelines set by your surgeon and physical therapist, as they can monitor your progress and address any concerns.               INSTRUCTIONS AFTER JOINT REPLACEMENT   Remove items at home which could result in a fall. This includes throw rugs or furniture in walking pathways ICE to the affected joint every three hours while awake for 30 minutes at a time, for at least the first 3-5 days, and then as needed for pain and swelling.  Continue to use ice for pain and swelling. You may  notice swelling that will progress down to the foot and ankle.  This is normal after surgery.  Elevate your leg when you are not up walking on it.   Continue to use the breathing machine you got in the hospital (incentive spirometer) which will help keep your temperature down.  It is common for your temperature to cycle up and down following surgery, especially at night when you are not up moving around and exerting yourself.  The breathing machine keeps your lungs expanded and your temperature down.   DIET:  As you were doing prior to hospitalization, we recommend a well-balanced diet.  DRESSING / WOUND CARE / SHOWERING  You may change your dressing 3-5 days after surgery.  Then change the dressing every day with sterile gauze.  Please use good hand washing techniques  before changing the dressing.  Do not use any lotions or creams on the incision until instructed by your surgeon.  ACTIVITY  Increase activity slowly as tolerated, but follow the weight bearing instructions below.   No driving for 6 weeks or until further direction given by your physician.  You cannot drive while taking narcotics.  No lifting or carrying greater than 10 lbs. until further directed by your surgeon. Avoid periods of inactivity such as sitting longer than an hour when not asleep. This helps prevent blood clots.  You may return to work once you are authorized by your doctor.     WEIGHT BEARING   Weight bearing as tolerated with assist device (walker, cane, etc) as directed, use it as long as suggested by your surgeon or therapist, typically at least 4-6 weeks.   EXERCISES  Results after joint replacement surgery are often greatly improved when you follow the exercise, range of motion and muscle strengthening exercises prescribed by your doctor. Safety measures are also important to protect the joint from further injury. Any time any of these exercises cause you to have increased pain or swelling, decrease what you are doing until you are comfortable again and then slowly increase them. If you have problems or questions, call your caregiver or physical therapist for advice.   Rehabilitation is important following a joint replacement. After just a few days of immobilization, the muscles of the leg can become weakened and shrink (atrophy).  These exercises are designed to build up the tone and strength of the thigh and leg muscles and to improve motion. Often times heat used for twenty to thirty minutes before working out will loosen up your tissues and help with improving the range of motion but do not use heat for the first two weeks following surgery (sometimes heat can increase post-operative swelling).   These exercises can be done on a training (exercise) mat, on the floor, on  a table or on a bed. Use whatever works the best and is most comfortable for you.    Use music or television while you are exercising so that the exercises are a pleasant break in your day. This will make your life better with the exercises acting as a break in your routine that you can look forward to.   Perform all exercises about fifteen times, three times per day or as directed.  You should exercise both the operative leg and the other leg as well.  Exercises include:   Quad Sets - Tighten up the muscle on the front of the thigh (Quad) and hold for 5-10 seconds.   Straight Leg Raises - With your knee straight (if you  were given a brace, keep it on), lift the leg to 60 degrees, hold for 3 seconds, and slowly lower the leg.  Perform this exercise against resistance later as your leg gets stronger.  Leg Slides: Lying on your back, slowly slide your foot toward your buttocks, bending your knee up off the floor (only go as far as is comfortable). Then slowly slide your foot back down until your leg is flat on the floor again.  Angel Wings: Lying on your back spread your legs to the side as far apart as you can without causing discomfort.  Hamstring Strength:  Lying on your back, push your heel against the floor with your leg straight by tightening up the muscles of your buttocks.  Repeat, but this time bend your knee to a comfortable angle, and push your heel against the floor.  You may put a pillow under the heel to make it more comfortable if necessary.   A rehabilitation program following joint replacement surgery can speed recovery and prevent re-injury in the future due to weakened muscles. Contact your doctor or a physical therapist for more information on knee rehabilitation.    CONSTIPATION  Constipation is defined medically as fewer than three stools per week and severe constipation as less than one stool per week.  Even if you have a regular bowel pattern at home, your normal regimen is  likely to be disrupted due to multiple reasons following surgery.  Combination of anesthesia, postoperative narcotics, change in appetite and fluid intake all can affect your bowels.   YOU MUST use at least one of the following options; they are listed in order of increasing strength to get the job done.  They are all available over the counter, and you may need to use some, POSSIBLY even all of these options:    Drink plenty of fluids (prune juice may be helpful) and high fiber foods Colace 100 mg by mouth twice a day  Senokot for constipation as directed and as needed Dulcolax (bisacodyl), take with full glass of water  Miralax (polyethylene glycol) once or twice a day as needed.  If you have tried all these things and are unable to have a bowel movement in the first 3-4 days after surgery call either your surgeon or your primary doctor.    If you experience loose stools or diarrhea, hold the medications until you stool forms back up.  If your symptoms do not get better within 1 week or if they get worse, check with your doctor.  If you experience the worst abdominal pain ever or develop nausea or vomiting, please contact the office immediately for further recommendations for treatment.   ITCHING:  If you experience itching with your medications, try taking only a single pain pill, or even half a pain pill at a time.  You can also use Benadryl over the counter for itching or also to help with sleep.   TED HOSE STOCKINGS:  Use stockings on both legs until for at least 2 weeks or as directed by physician office. They may be removed at night for sleeping.  MEDICATIONS:  See your medication summary on the "After Visit Summary" that nursing will review with you.  You may have some home medications which will be placed on hold until you complete the course of blood thinner medication.  It is important for you to complete the blood thinner medication as prescribed.  PRECAUTIONS:  If you experience  chest pain or shortness of breath -  call 911 immediately for transfer to the hospital emergency department.   If you develop a fever greater that 101 F, purulent drainage from wound, increased redness or drainage from wound, foul odor from the wound/dressing, or calf pain - CONTACT YOUR SURGEON.                                                   FOLLOW-UP APPOINTMENTS:  If you do not already have a post-op appointment, please call the office for an appointment to be seen by your surgeon.  Guidelines for how soon to be seen are listed in your "After Visit Summary", but are typically between 1-4 weeks after surgery.  OTHER INSTRUCTIONS:   Knee Replacement:  Do not place pillow under knee, focus on keeping the knee straight while resting. CPM instructions: 0-90 degrees, 2 hours in the morning, 2 hours in the afternoon, and 2 hours in the evening. Place foam block, curve side up under heel at all times except when in CPM or when walking.  DO NOT modify, tear, cut, or change the foam block in any way.  POST-OPERATIVE OPIOID TAPER INSTRUCTIONS: It is important to wean off of your opioid medication as soon as possible. If you do not need pain medication after your surgery it is ok to stop day one. Opioids include: Codeine, Hydrocodone (Norco, Vicodin), Oxycodone (Percocet, oxycontin ) and hydromorphone amongst others.  Long term and even short term use of opiods can cause: Increased pain response Dependence Constipation Depression Respiratory depression And more.  Withdrawal symptoms can include Flu like symptoms Nausea, vomiting And more Techniques to manage these symptoms Hydrate well Eat regular healthy meals Stay active Use relaxation techniques(deep breathing, meditating, yoga) Do Not substitute Alcohol to help with tapering If you have been on opioids for less than two weeks and do not have pain than it is ok to stop all together.  Plan to wean off of opioids This plan should start within  one week post op of your joint replacement. Maintain the same interval or time between taking each dose and first decrease the dose.  Cut the total daily intake of opioids by one tablet each day Next start to increase the time between doses. The last dose that should be eliminated is the evening dose.   MAKE SURE YOU:  Understand these instructions.  Get help right away if you are not doing well or get worse.    Thank you for letting us  be a part of your medical care team.  It is a privilege we respect greatly.  We hope these instructions will help you stay on track for a fast and full recovery!

## 2024-09-08 NOTE — Telephone Encounter (Signed)
 Pt called stating waiting for call to pick up handicap placard she dropped off last week. Please call pt at [phone].

## 2024-09-08 NOTE — TOC Transition Note (Addendum)
 Transition of Care [facility]) - Discharge Note   Patient Details  Name: Madison Mosley MRN: [id number] Date of Birth: February 14, 1951  Transition of [facility]) CM/SW Contact:  Rosalva Jon Bloch, RN Phone Number: 09/08/2024, 11:18 AM   Clinical Narrative:    Patient will DC to: home Anticipated DC date: 09/08/2024 Family notified: yes Transport by: car  Per MD patient ready for DC today. RN, patient, patient's family, and Melissa with  [employer] ( home health services pre-arranged by provider) notified of DC. Husband to assist with care once d/c. Referral made with [employer] [phone]) for DME: RW, pt without provider preference. Equipment will be delivered to bedside prior to d/c. Pt without RX med concerns, pt to pick up meds from local pharmacy. Post hospital f/u noted on AVS.  Husband to provide transportation to home.  RNCM will sign off for now as intervention is no longer needed. Please consult us  again if new needs arise.    Final next level of care: Home w Home Health Services Barriers to Discharge: No Barriers Identified   Patient Goals and CMS Choice            Discharge Placement                       Discharge Plan and Services Additional resources added to the After Visit Summary for                            [facility] Arranged: PT HH Agency: Other - See comment Photographer)        Social Drivers of Health (SDOH) Interventions SDOH Screenings   Food Insecurity: No Food Insecurity (09/08/2024)  Housing: Low Risk  (09/08/2024)  Transportation Needs: No Transportation Needs (09/08/2024)  Utilities: Not At Risk (09/08/2024)  Social Connections: Unknown (09/08/2024)  Tobacco Use: Low Risk  (09/07/2024)     Readmission Risk Interventions     No data to display

## 2024-09-08 NOTE — Progress Notes (Signed)
 Patient ID: Madison Mosley, female   DOB: 1951/11/14, 73 y.o.   MRN: [id number] Patient is postoperative day 1 total knee arthroplasty.  Patient was given instructions to work on knee extension.  Recommend she take the ice machine at home with her.  Will discharge her with Percocet, aspirin , Zofran .  Plan for discharge today after therapy.

## 2024-09-08 NOTE — Discharge Summary (Signed)
 Physician Discharge Summary  Patient ID: Madison Mosley MRN: [id number] DOB/AGE: 1951/06/10 73 y.o.  Admit date: 09/07/2024 Discharge date: 09/08/2024  Admission Diagnoses:  Principal Problem:   Osteoarthritis of left knee, unspecified osteoarthritis type Active Problems:   Arthritis of left knee   Discharge Diagnoses:  Same  Past Medical History:  Diagnosis Date   Arthritis    CAD (coronary artery disease)    stents   GERD (gastroesophageal reflux disease)    Headache    Hyperlipidemia    Hypertension     Surgeries: Procedure(s): ARTHROPLASTY, KNEE, TOTAL LEFT on 09/07/2024   Consultants:   Discharged Condition: Improved  Hospital Course: Madison Mosley is an 73 y.o. female who was admitted 09/07/2024 with a chief complaint of No chief complaint on file. , and found to have a diagnosis of Osteoarthritis of left knee, unspecified osteoarthritis type.  They were brought to the operating room on 09/07/2024 and underwent the above named procedures.    They were given perioperative antibiotics:  Anti-infectives (From admission, onward)    Start     Dose/Rate Route Frequency Ordered Stop   09/07/24 0600  ceFAZolin  (ANCEF ) IVPB 2g/100 mL premix        2 g 200 mL/hr over 30 Minutes Intravenous On call to O.R. 09/07/24 9444 09/07/24 0743     .  They were given compression stockings, early ambulation, and chemoprophylaxis for DVT prophylaxis.  They benefited maximally from their hospital stay and there were no complications.    Recent vital signs:  Vitals:   09/08/24 0327 09/08/24 0731  BP: (!) 132/57 (!) 157/131  Pulse: 72 82  Resp: 18   Temp: 98.1 F (36.7 C) 97.7 F (36.5 C)  SpO2: 93% 97%    Recent laboratory studies:  Results for orders placed or performed during the hospital encounter of 09/07/24  Basic metabolic panel   Collection Time: 09/08/24  3:49 AM  Result Value Ref Range   Sodium 136 135 - 145 mmol/L   Potassium 3.5 3.5 - 5.1 mmol/L    Chloride 99 98 - 111 mmol/L   CO2 26 22 - 32 mmol/L   Glucose, Bld 120 (H) 70 - 99 mg/dL   BUN 12 8 - 23 mg/dL   Creatinine, Ser 9.07 0.44 - 1.00 mg/dL   Calcium 8.6 (L) 8.9 - 10.3 mg/dL   GFR, Estimated >39 >39 mL/min   Anion gap 11 5 - 15  CBC   Collection Time: 09/08/24  3:49 AM  Result Value Ref Range   WBC 14.1 (H) 4.0 - 10.5 K/uL   RBC 3.45 (L) 3.87 - 5.11 MIL/uL   Hemoglobin 10.1 (L) 12.0 - 15.0 g/dL   HCT 68.3 (L) 63.9 - 53.9 %   MCV 91.6 80.0 - 100.0 fL   MCH 29.3 26.0 - 34.0 pg   MCHC 32.0 30.0 - 36.0 g/dL   RDW 84.5 88.4 - 84.4 %   Platelets 278 150 - 400 K/uL   nRBC 0.0 0.0 - 0.2 %    Discharge Medications:   Allergies as of 09/08/2024       Reactions   Crestor  [rosuvastatin Calcium]    Arthralgia (Joint Pain)   Ezetimibe    Arthralgia (Joint Pain)   Pitavastatin    Arthralgia (Joint Pain)   Porcine (pork) Protein-containing Drug Products    Migraines    Welchol  [colesevelam Hcl]    Arthralgia (Joint Pain)   Zocor  [simvastatin]    Arthralgia (Joint  Pain)   Naproxen Itching, Rash   Palm of Hand        Medication List     STOP taking these medications    HYDROcodone -acetaminophen  5-325 MG tablet Commonly known as: NORCO/VICODIN       TAKE these medications    aspirin  EC 81 MG tablet Take 1 tablet (81 mg total) by mouth daily.   ELDERBERRY PO Take 1 capsule by mouth daily.   Magnesium 250 MG Tabs Take 250 mg by mouth 4 (four) times a week.   metoprolol succinate 50 MG 24 hr tablet Commonly known as: TOPROL-XL Take 25 mg by mouth daily.   multivitamin with minerals Tabs tablet Take 1 tablet by mouth 4 (four) times a week.   ondansetron  4 MG tablet Commonly known as: Zofran  Take 1 tablet (4 mg total) by mouth every 8 (eight) hours as needed for nausea or vomiting.   Oxycodone  HCl 10 MG Tabs Take 0.5 tablets (5 mg total) by mouth every 4 (four) hours as needed for severe pain (pain score 7-10) (pain score 7-10).   PROBIOTIC  PO Take 1 capsule by mouth daily.   QUERCETIN PO Take 1 tablet by mouth daily. With bromelain   SPIRULINA PO Take 1 tablet by mouth daily.   triamcinolone  cream 0.1 % Commonly known as: KENALOG Apply 1 Application topically as needed (itch).   triamterene-hydrochlorothiazide 37.5-25 MG tablet Commonly known as: MAXZIDE-25 Take 1 tablet by mouth daily.   VITAMIN C PO Take 500 mg by mouth 2 (two) times a week.   zinc gluconate 50 MG tablet Take 50 mg by mouth 2 (two) times a week.               Durable Medical Equipment  (From admission, onward)           Start     Ordered   09/08/24 0906  For home use only DME Walker rolling  Once       Question Answer Comment  Walker: With 5 Inch Wheels   Patient needs a walker to treat with the following condition Post-operative state      09/08/24 0905   09/08/24 0906  For home use only DME 3 n 1  Once        09/08/24 0905            Diagnostic Studies: No results found.  Disposition: Discharge disposition: 01-Home or Self Care       Discharge Instructions     Call MD / Call 911   Complete by: As directed    If you experience chest pain or shortness of breath, CALL 911 and be transported to the hospital emergency room.  If you develope a fever above 101 F, pus (white drainage) or increased drainage or redness at the wound, or calf pain, call your surgeon's office.   Constipation Prevention   Complete by: As directed    Drink plenty of fluids.  Prune juice may be helpful.  You may use a stool softener, such as Colace (over the counter) 100 mg twice a day.  Use MiraLax (over the counter) for constipation as needed.   Diet - low sodium heart healthy   Complete by: As directed    Discharge instructions   Complete by: As directed    Weight bearing as tolerates left LE   Increase activity slowly as tolerated   Complete by: As directed    Post-operative opioid taper instructions:   Complete by: As  directed     POST-OPERATIVE OPIOID TAPER INSTRUCTIONS: It is important to wean off of your opioid medication as soon as possible. If you do not need pain medication after your surgery it is ok to stop day one. Opioids include: Codeine, Hydrocodone (Norco, Vicodin), Oxycodone (Percocet, oxycontin ) and hydromorphone amongst others.  Long term and even short term use of opiods can cause: Increased pain response Dependence Constipation Depression Respiratory depression And more.  Withdrawal symptoms can include Flu like symptoms Nausea, vomiting And more Techniques to manage these symptoms Hydrate well Eat regular healthy meals Stay active Use relaxation techniques(deep breathing, meditating, yoga) Do Not substitute Alcohol to help with tapering If you have been on opioids for less than two weeks and do not have pain than it is ok to stop all together.  Plan to wean off of opioids This plan should start within one week post op of your joint replacement. Maintain the same interval or time between taking each dose and first decrease the dose.  Cut the total daily intake of opioids by one tablet each day Next start to increase the time between doses. The last dose that should be eliminated is the evening dose.           Contact information for follow-up providers     Harden Jerona GAILS, MD Follow up in 1 week(s).   Specialty: Orthopedic Surgery Contact information: [address] [place] [address] [phone]         Clarice Nottingham, MD Follow up.   Specialty: Internal Medicine Contact information: [address] [place] [address] [phone]         [facility] Follow up.   Why: home health services will be provided by [employer] information: [address] [place], [place] [address] Phone: [phone]             Contact information for after-discharge care     Durable Medical Equipment     [facility]  (DME) .   Service: Durable Medical Equipment Contact information: [address] [place]  [address] [phone]                      Signed: Jerona GAILS Harden 09/08/2024, 11:40 AM

## 2024-09-08 NOTE — Progress Notes (Signed)
 Physical Therapy Treatment  Patient Details Name: Madison Mosley MRN: [id number] DOB: [birth year] Today's Date: 09/08/2024   History of Present Illness 73 y.o. female presents to [facility] hospital on 09/07/2024 for elective L TKA. PMH includes CAD, HTN, R meniscal tear.    PT Comments  Pt progressing towards physical therapy goals. Was able to perform transfers and ambulation with gross CGA and RW for support. Pt was able to negotiate stairs with up to mod assist for safe entry into home. Husband present for education and pt provided a gait belt to take home. Reviewed HEP, positioning recommendations, and appropriate activity progression. Will continue to follow and progress as able per POC.     If plan is discharge home, recommend the following: A little help with walking and/or transfers;A little help with bathing/dressing/bathroom;Assistance with cooking/housework;Assist for transportation;Help with stairs or ramp for entrance   Can travel by private vehicle        Equipment Recommendations  Rolling walker (2 wheels);BSC/3in1    Recommendations for Other Services       Precautions / Restrictions Precautions Precautions: Fall;Knee Precaution Booklet Issued: Yes (comment) Recall of Precautions/Restrictions: Intact Restrictions Weight Bearing Restrictions Per Provider Order: Yes LLE Weight Bearing Per Provider Order: Weight bearing as tolerated     Mobility  Bed Mobility Overal bed mobility: Needs Assistance Bed Mobility: Supine to Sit, Sit to Supine     Supine to sit: Contact guard     General bed mobility comments: HOB elevated. Pt was able to use hands to assist LLE advancement towards EOB.    Transfers Overall transfer level: Needs assistance Equipment used: Rolling walker (2 wheels) Transfers: Sit to/from Stand Sit to Stand: Min assist, From elevated surface           General transfer comment: VC's for hand placement on seated surface for safety.     Ambulation/Gait Ambulation/Gait assistance: Contact guard assist Gait Distance (Feet): 120 Feet Assistive device: Rolling walker (2 wheels) Gait Pattern/deviations: Step-to pattern, Step-through pattern, Decreased stride length, Decreased weight shift to left, Decreased dorsiflexion - left, Trunk flexed Gait velocity: Decreased Gait velocity interpretation: <1.31 ft/sec, indicative of household ambulator   General Gait Details: VC's for improved posture, increased heel strike and sequencing with the RW. Pt was able to progress to step-through gait pattern.   Stairs Stairs: Yes Stairs assistance: Mod assist Stair Management: One rail Right, Step to pattern, Forwards Number of Stairs: 2 General stair comments: VC's for sequencing and general safety. Husband present for education and assisted pt to advance through 2 steps in rehab gym. PT guarding both pt and husband for safety.   Wheelchair Mobility     Tilt Bed    Modified Rankin (Stroke Patients Only)       Balance Overall balance assessment: Needs assistance Sitting-balance support: No upper extremity supported, Feet supported Sitting balance-Leahy Scale: Good     Standing balance support: Single extremity supported, Reliant on assistive device for balance Standing balance-Leahy Scale: Poor                              Communication Communication Communication: No apparent difficulties  Cognition Arousal: Alert Behavior During Therapy: WFL for tasks assessed/performed   PT - Cognitive impairments: No apparent impairments                         Following commands: Intact      Cueing  Cueing Techniques: Verbal cues  Exercises Total Joint Exercises Ankle Circles/Pumps: 10 reps Quad Sets: 10 reps Towel Squeeze: 10 reps Short Arc Quad: 10 reps Heel Slides: 10 reps Hip ABduction/ADduction: 10 reps Long Arc Quad: 10 reps Knee Flexion: 10 reps Goniometric ROM: 10-77    General  Comments        Pertinent Vitals/Pain Pain Assessment Pain Assessment: 0-10 Pain Score: 8  Pain Location: L knee Pain Descriptors / Indicators: Sore Pain Intervention(s): Limited activity within patient's tolerance, Monitored during session, Repositioned    Home Living                          Prior Function            PT Goals (current goals can now be found in the care plan section) Acute Rehab PT Goals Patient Stated Goal: to return to independence PT Goal Formulation: With patient Time For Goal Achievement: 09/11/24 Potential to Achieve Goals: Good Progress towards PT goals: Progressing toward goals    Frequency    7X/week      PT Plan      Co-evaluation              AM-PAC PT 6 Clicks Mobility   Outcome Measure  Help needed turning from your back to your side while in a flat bed without using bedrails?: A Little Help needed moving from lying on your back to sitting on the side of a flat bed without using bedrails?: A Little Help needed moving to and from a bed to a chair (including a wheelchair)?: A Little Help needed standing up from a chair using your arms (e.g., wheelchair or bedside chair)?: A Little Help needed to walk in hospital room?: A Little Help needed climbing 3-5 steps with a railing? : A Little 6 Click Score: 18    End of Session Equipment Utilized During Treatment: Gait belt Activity Tolerance: Patient tolerated treatment well Patient left: in bed;with call bell/phone within reach;with bed alarm set Nurse Communication: Mobility status PT Visit Diagnosis: Other abnormalities of gait and mobility (R26.89);Muscle weakness (generalized) (M62.81);Pain Pain - Right/Left: Left Pain - part of body: Knee     Time: 9040-8940 PT Time Calculation (min) (ACUTE ONLY): 60 min  Charges:    $Gait Training: 23-37 mins $Therapeutic Exercise: 23-37 mins PT General Charges $$ ACUTE PT VISIT: 1 Visit                     Leita Sable, PT, DPT Acute Rehabilitation Services Secure Chat Preferred Office: [phone]    [name] 09/08/2024, 11:13 AM

## 2024-09-08 NOTE — Telephone Encounter (Signed)
 They ended up taking her after all. They didn't realize she had requested them and her daughter works for them as well. Thanks.

## 2024-09-08 NOTE — Progress Notes (Signed)
 RN went over DC instructions with patient and her husband she stated understanding IV has been removed, medications have been escribed to home pharmacy. RN waiting for Aquacel dressing to be delivered so I can change patients dx. Waiting for RW to delivered

## 2024-09-08 NOTE — Discharge Summary (Signed)
 Physician Discharge Summary  Patient ID: Madison Mosley MRN: [id number] DOB/AGE: 74-Dec-1952 74 y.o.  Admit date: 09/07/2024 Discharge date: 09/08/2024  Admission Diagnoses:  Principal Problem:   Osteoarthritis of left knee, unspecified osteoarthritis type Active Problems:   Arthritis of left knee   Discharge Diagnoses:  Same  Past Medical History:  Diagnosis Date   Arthritis    CAD (coronary artery disease)    stents   GERD (gastroesophageal reflux disease)    Headache    Hyperlipidemia    Hypertension     Surgeries: Procedure(s): ARTHROPLASTY, KNEE, TOTAL LEFT on 09/07/2024   Consultants:   Discharged Condition: Improved  Hospital Course: Madison Mosley is an 73 y.o. female who was admitted 09/07/2024 with a chief complaint of No chief complaint on file. , and found to have a diagnosis of Osteoarthritis of left knee, unspecified osteoarthritis type.  They were brought to the operating room on 09/07/2024 and underwent the above named procedures.    They were given perioperative antibiotics:  Anti-infectives (From admission, onward)    Start     Dose/Rate Route Frequency Ordered Stop   09/07/24 0600  ceFAZolin  (ANCEF ) IVPB 2g/100 mL premix        2 g 200 mL/hr over 30 Minutes Intravenous On call to O.R. 09/07/24 [id number] 09/07/24 0743     .  They were given compression stockings, early ambulation, and chemoprophylaxis for DVT prophylaxis.  They benefited maximally from their hospital stay and there were no complications.    Recent vital signs:  Vitals:   09/08/24 0327 09/08/24 0731  BP: (!) 132/57 (!) 157/131  Pulse: 72 82  Resp: 18   Temp: 98.1 F (36.7 C) 97.7 F (36.5 C)  SpO2: 93% 97%    Recent laboratory studies:  Results for orders placed or performed during the hospital encounter of 09/07/24  Basic metabolic panel   Collection Time: 09/08/24  3:49 AM  Result Value Ref Range   Sodium 136 135 - 145 mmol/L   Potassium 3.5 3.5 - 5.1 mmol/L    Chloride 99 98 - 111 mmol/L   CO2 26 22 - 32 mmol/L   Glucose, Bld 120 (H) 70 - 99 mg/dL   BUN 12 8 - 23 mg/dL   Creatinine, Ser 9.07 0.44 - 1.00 mg/dL   Calcium 8.6 (L) 8.9 - 10.3 mg/dL   GFR, Estimated >39 >39 mL/min   Anion gap 11 5 - 15  CBC   Collection Time: 09/08/24  3:49 AM  Result Value Ref Range   WBC 14.1 (H) 4.0 - 10.5 K/uL   RBC 3.45 (L) 3.87 - 5.11 MIL/uL   Hemoglobin 10.1 (L) 12.0 - 15.0 g/dL   HCT 68.3 (L) 63.9 - 53.9 %   MCV 91.6 80.0 - 100.0 fL   MCH 29.3 26.0 - 34.0 pg   MCHC 32.0 30.0 - 36.0 g/dL   RDW 84.5 88.4 - 84.4 %   Platelets 278 150 - 400 K/uL   nRBC 0.0 0.0 - 0.2 %    Discharge Medications:   Allergies as of 09/08/2024       Reactions   Crestor  [rosuvastatin Calcium]    Arthralgia (Joint Pain)   Ezetimibe    Arthralgia (Joint Pain)   Pitavastatin    Arthralgia (Joint Pain)   Porcine (pork) Protein-containing Drug Products    Migraines    Welchol  [colesevelam Hcl]    Arthralgia (Joint Pain)   Zocor  [simvastatin]    Arthralgia (Joint  Pain)   Naproxen Itching, Rash   Palm of Hand        Medication List     STOP taking these medications    HYDROcodone -acetaminophen  5-325 MG tablet Commonly known as: NORCO/VICODIN       TAKE these medications    aspirin  EC 81 MG tablet Take 1 tablet (81 mg total) by mouth daily.   ELDERBERRY PO Take 1 capsule by mouth daily.   Magnesium 250 MG Tabs Take 250 mg by mouth 4 (four) times a week.   metoprolol succinate 50 MG 24 hr tablet Commonly known as: TOPROL-XL Take 25 mg by mouth daily.   multivitamin with minerals Tabs tablet Take 1 tablet by mouth 4 (four) times a week.   Oxycodone  HCl 10 MG Tabs Take 0.5 tablets (5 mg total) by mouth every 4 (four) hours as needed for severe pain (pain score 7-10) (pain score 7-10).   PROBIOTIC PO Take 1 capsule by mouth daily.   QUERCETIN PO Take 1 tablet by mouth daily. With bromelain   SPIRULINA PO Take 1 tablet by mouth daily.    triamcinolone  cream 0.1 % Commonly known as: KENALOG Apply 1 Application topically as needed (itch).   triamterene-hydrochlorothiazide 37.5-25 MG tablet Commonly known as: MAXZIDE-25 Take 1 tablet by mouth daily.   VITAMIN C PO Take 500 mg by mouth 2 (two) times a week.   zinc gluconate 50 MG tablet Take 50 mg by mouth 2 (two) times a week.        Diagnostic Studies: No results found.  Disposition: Discharge disposition: 01-Home or Self Care       Discharge Instructions     Call MD / Call 911   Complete by: As directed    If you experience chest pain or shortness of breath, CALL 911 and be transported to the hospital emergency room.  If you develope a fever above 101 F, pus (white drainage) or increased drainage or redness at the wound, or calf pain, call your surgeon's office.   Constipation Prevention   Complete by: As directed    Drink plenty of fluids.  Prune juice may be helpful.  You may use a stool softener, such as Colace (over the counter) 100 mg twice a day.  Use MiraLax (over the counter) for constipation as needed.   Diet - low sodium heart healthy   Complete by: As directed    Discharge instructions   Complete by: As directed    Weight bearing as tolerates left LE   Increase activity slowly as tolerated   Complete by: As directed    Post-operative opioid taper instructions:   Complete by: As directed    POST-OPERATIVE OPIOID TAPER INSTRUCTIONS: It is important to wean off of your opioid medication as soon as possible. If you do not need pain medication after your surgery it is ok to stop day one. Opioids include: Codeine, Hydrocodone (Norco, Vicodin), Oxycodone (Percocet, oxycontin ) and hydromorphone amongst others.  Long term and even short term use of opiods can cause: Increased pain response Dependence Constipation Depression Respiratory depression And more.  Withdrawal symptoms can include Flu like symptoms Nausea, vomiting And  more Techniques to manage these symptoms Hydrate well Eat regular healthy meals Stay active Use relaxation techniques(deep breathing, meditating, yoga) Do Not substitute Alcohol to help with tapering If you have been on opioids for less than two weeks and do not have pain than it is ok to stop all together.  Plan to wean off of opioids  This plan should start within one week post op of your joint replacement. Maintain the same interval or time between taking each dose and first decrease the dose.  Cut the total daily intake of opioids by one tablet each day Next start to increase the time between doses. The last dose that should be eliminated is the evening dose.           Follow-up Information     Harden Jerona GAILS, MD Follow up in 1 week(s).   Specialty: Orthopedic Surgery Contact information: [address] [place]  [place] [address] [phone]                  Signed: Maurilio Deland Collet 09/08/2024, 8:19 AM

## 2024-09-14 ENCOUNTER — Telehealth: Payer: Self-pay | Admitting: Orthopedic Surgery

## 2024-09-14 ENCOUNTER — Ambulatory Visit: Admitting: Rehabilitative and Restorative Service Providers"

## 2024-09-14 ENCOUNTER — Other Ambulatory Visit: Payer: Self-pay | Admitting: Orthopedic Surgery

## 2024-09-14 MED ORDER — SULFAMETHOXAZOLE-TRIMETHOPRIM 800-160 MG PO TABS: 1.0000 | ORAL_TABLET | Freq: Two times a day (BID) | ORAL | 0 refills | Status: AC

## 2024-09-14 NOTE — Telephone Encounter (Signed)
 Pt called stating that she has been in pain since her surgery and doing more with her PT and she needs something else or something stronger for her pain so she can continue with therapy. Pharmacy is [facility] on [address]. Pt call back number is [phone]

## 2024-09-14 NOTE — Telephone Encounter (Signed)
 This pt is s/p a total knee replacement on 09/07/2024. Already given rx for Oxycodone  10 mg and is requesting to take something else or stronger for pain please advise.

## 2024-09-15 ENCOUNTER — Other Ambulatory Visit: Payer: Self-pay | Admitting: Orthopedic Surgery

## 2024-09-15 MED ORDER — KETOROLAC TROMETHAMINE 10 MG PO TABS: 10.0000 mg | ORAL_TABLET | Freq: Four times a day (QID) | ORAL | 0 refills | Status: DC | PRN

## 2024-09-15 NOTE — Telephone Encounter (Signed)
 I called and se pt to advise of message below. She also states that her dressing is looking a little soiled and advised ok to remove and cover with gauze and ace bandage. She has a follow up appt sch for Tuesday 09/20/2024 and will call with any other questions or concerns.

## 2024-09-19 ENCOUNTER — Encounter: Payer: Self-pay | Admitting: Radiology

## 2024-09-20 ENCOUNTER — Other Ambulatory Visit

## 2024-09-20 ENCOUNTER — Encounter: Payer: Self-pay | Admitting: Family

## 2024-09-20 ENCOUNTER — Ambulatory Visit: Admitting: Family

## 2024-09-20 DIAGNOSIS — Z96652 Presence of left artificial knee joint: Secondary | ICD-10-CM

## 2024-09-20 DIAGNOSIS — M1712 Unilateral primary osteoarthritis, left knee: Secondary | ICD-10-CM

## 2024-09-20 MED ORDER — HYDROCODONE-ACETAMINOPHEN 5-325 MG PO TABS: 1.0000 | ORAL_TABLET | ORAL | 0 refills | Status: DC | PRN

## 2024-09-20 NOTE — Progress Notes (Unsigned)
   Post-Op Visit Note   Patient: Madison Mosley           Date of Birth: 1951-08-22           MRN: [id number] Visit Date: 09/20/2024 PCP: Clarice Nottingham, MD  Chief Complaint: No chief complaint on file.   HPI:  HPI  Ortho Exam ***  Visit Diagnoses:  1. S/P total knee arthroplasty, left   2. Osteoarthritis of left knee, unspecified osteoarthritis type     Plan: ***  Follow-Up Instructions: No follow-ups on file.   Imaging: No results found.  Orders:  Orders Placed This Encounter  Procedures   XR Knee 1-2 Views Left   No orders of the defined types were placed in this encounter.    PMFS History: Patient Active Problem List   Diagnosis Date Noted   Arthritis of left knee 09/07/2024   Osteoarthritis of left knee, unspecified osteoarthritis type 09/07/2024   Osteochondral defect of femoral condyle 05/06/2024   Right knee meniscal tear 10/02/2020   Unilateral primary osteoarthritis, right knee 04/25/2020   Low back pain 03/21/2020   Statin myopathy 11/16/2019   Atrophic vaginitis 06/18/2017   Sprain of calcaneofibular ligament of left ankle 01/16/2017   Obesity (BMI 30.0-34.9) 01/05/2015   CAD S/P percutaneous coronary angioplasty 04/21/2013   Hypercholesterolemia 04/21/2013   Essential hypertension 04/21/2013   Past Medical History:  Diagnosis Date   Arthritis    CAD (coronary artery disease)    stents   GERD (gastroesophageal reflux disease)    Headache    Hyperlipidemia    Hypertension     Family History  Problem Relation Age of Onset   COPD Mother    Heart failure Mother    Diabetes Mother    Valvular heart disease Mother        mitral valve leakage   Heart attack Father 25       multiple heart attacks   Heart attack Sister 23   CAD Brother 56   Heart attack Maternal Grandfather    Heart attack Paternal Grandfather        multiple hearts attacks   Stroke Paternal Grandfather     Past Surgical History:  Procedure Laterality Date    CARDIAC CATHETERIZATION  11/18/1999   Percutaneous revascularization precedure with angioplasty to the proximal LAD and first diagonal    CESAREAN SECTION     CHOLECYSTECTOMY     CORONARY STENT PLACEMENT     KNEE ARTHROSCOPY WITH MEDIAL MENISECTOMY Left 05/06/2024   Procedure: LEFT KNEE ARTHROSCOPY WITH MEDIAL MENISCECTOMY;  Surgeon: Harden Jerona GAILS, MD;  Location: [facility] OR;  Service: Orthopedics;  Laterality: Left;   PARTIAL HYSTERECTOMY     TONSILLECTOMY  11/17/1958   TOTAL KNEE ARTHROPLASTY Left 09/07/2024   Procedure: ARTHROPLASTY, KNEE, TOTAL LEFT;  Surgeon: Harden Jerona GAILS, MD;  Location: [facility] OR;  Service: Orthopedics;  Laterality: Left;   Social History   Occupational History   Not on file  Tobacco Use   Smoking status: Never   Smokeless tobacco: Never  Vaping Use   Vaping status: Never Used  Substance and Sexual Activity   Alcohol use: No    Alcohol/week: 0.0 standard drinks of alcohol   Drug use: No   Sexual activity: Not Currently

## 2024-09-21 NOTE — Therapy (Signed)
 OUTPATIENT PHYSICAL THERAPY LOWER EXTREMITY EVALUATION   Patient Name: Madison Mosley MRN: [id number] DOB:12-18-1950, 73 y.o., female Today's Date: 09/22/2024  END OF SESSION:  PT End of Session - 09/22/24 1429     Visit Number 1    Number of Visits 25    Date for Recertification  12/15/24    Authorization Type [employer] Medicare    PT Start Time 1345    PT Stop Time 1420    PT Time Calculation (min) 35 min    Activity Tolerance Patient tolerated treatment well    Behavior During Therapy WFL for tasks assessed/performed          Past Medical History:  Diagnosis Date   Arthritis    CAD (coronary artery disease)    stents   GERD (gastroesophageal reflux disease)    Headache    Hyperlipidemia    Hypertension    Past Surgical History:  Procedure Laterality Date   CARDIAC CATHETERIZATION  11/18/1999   Percutaneous revascularization precedure with angioplasty to the proximal LAD and first diagonal    CESAREAN SECTION     CHOLECYSTECTOMY     CORONARY STENT PLACEMENT     KNEE ARTHROSCOPY WITH MEDIAL MENISECTOMY Left 05/06/2024   Procedure: LEFT KNEE ARTHROSCOPY WITH MEDIAL MENISCECTOMY;  Surgeon: Harden Jerona GAILS, MD;  Location: [facility] OR;  Service: Orthopedics;  Laterality: Left;   PARTIAL HYSTERECTOMY     TONSILLECTOMY  11/17/1958   TOTAL KNEE ARTHROPLASTY Left 09/07/2024   Procedure: ARTHROPLASTY, KNEE, TOTAL LEFT;  Surgeon: Harden Jerona GAILS, MD;  Location: [facility] OR;  Service: Orthopedics;  Laterality: Left;   Patient Active Problem List   Diagnosis Date Noted   Arthritis of left knee 09/07/2024   Osteoarthritis of left knee, unspecified osteoarthritis type 09/07/2024   Osteochondral defect of femoral condyle 05/06/2024   Right knee meniscal tear 10/02/2020   Unilateral primary osteoarthritis, right knee 04/25/2020   Low back pain 03/21/2020   Statin myopathy 11/16/2019   Atrophic vaginitis 06/18/2017   Sprain of calcaneofibular ligament of left ankle 01/16/2017   Obesity (BMI  30.0-34.9) 01/05/2015   CAD S/P percutaneous coronary angioplasty 04/21/2013   Hypercholesterolemia 04/21/2013   Essential hypertension 04/21/2013    PCP: Clarice Nottingham, MD   REFERRING PROVIDER: Harden Jerona GAILS, MD   REFERRING DIAG:  [phone] (ICD-10-CM) - Osteoarthritis of left knee, unspecified osteoarthritis type  Z96.652 (ICD-10-CM) - S/P total knee arthroplasty, left    THERAPY DIAG:  Acute pain of left knee  Localized edema  Difficulty in walking, not elsewhere classified  Muscle weakness (generalized)  Other abnormalities of gait and mobility  Rationale for Evaluation and Treatment: Rehabilitation  ONSET DATE: 09/07/24 L TKA  SUBJECTIVE:   SUBJECTIVE STATEMENT: L knee pain started in April.  She had a meniscus surgery in June but pain continued.  Now s/p TKA.  No CPM.  Had 6 HHPT visits.  PERTINENT HISTORY: -- PAIN:  Are you having pain? Yes: NPRS scale: 6/10 with medicine, 9/10 without medication Pain location: L all over Pain description: dull Aggravating factors: motion, bending, extending  Relieving factors: medication, ice  PRECAUTIONS: None  RED FLAGS: None   WEIGHT BEARING RESTRICTIONS: No  FALLS:  Has patient fallen in last 6 months? No  LIVING ENVIRONMENT: Lives with: lives with their spouse Lives in: House/apartment Stairs: Yes: External: 2.5 steps; on right going up Has following equipment at home: Vannie - 2 wheeled  OCCUPATION: retired   PLOF: Independent  PATIENT GOALS: decrease pain  NEXT MD VISIT: not listed  OBJECTIVE:  Note: Objective measures were completed at Evaluation unless otherwise noted.  DIAGNOSTIC FINDINGS: MRI 04/12/24  IMPRESSION: Tricompartmental osteoarthrosis. Mild to moderate chondromalacia with moderate reactive joint effusion.   Moderate radial tear at the root of the medial meniscus with medial displacement of body. There is second likely radial tear at the junction the body anterior horn of the  medial meniscus. See above for more detail.  PATIENT SURVEYS:  PSFS: THE PATIENT SPECIFIC FUNCTIONAL SCALE  Place score of 0-10 (0 = unable to perform activity and 10 = able to perform activity at the same level as before injury or problem)  Activity Date: 09/22/24    2 Steps no railing 0    2.driving 0    3.Shower independently 0    4.      Total Score 0      Total Score = Sum of activity scores/number of activities  Minimally Detectable Change: 3 points (for single activity); 2 points (for average score)  [facility] [facility] (nd). The Patient Specific Functional Scale . Retrieved from [email or web address]   COGNITION: Overall cognitive status: Within functional limits for tasks assessed     SENSATION: WFL  EDEMA:  Circumferential: 55 cm  MUSCLE LENGTH: Hamstrings:  Left mod restriction    POSTURE: No Significant postural limitations  PALPATION: 2+ tenderness at anterior knee   LOWER EXTREMITY ROM:  Active/Passive ROM Right eval Left eval  Hip flexion    Hip extension    Hip abduction    Hip adduction    Hip internal rotation    Hip external rotation    Knee flexion  80/84  Knee extension  +9/+5  Ankle dorsiflexion    Ankle plantarflexion    Ankle inversion    Ankle eversion     (Blank rows = not tested)  LOWER EXTREMITY MMT:  MMT Right eval Left eval  Hip flexion  4  Hip extension    Hip abduction  3+  Hip adduction    Hip internal rotation    Hip external rotation    Knee flexion  3  Knee extension  3  Ankle dorsiflexion    Ankle plantarflexion    Ankle inversion    Ankle eversion     (Blank rows = not tested)  LOWER EXTREMITY SPECIAL TESTS:  S/P no special tests completed  FUNCTIONAL TESTS:  5 times sit to stand: 32 sec  GAIT: Distance walked: household Assistive device utilized: Environmental Consultant - 2 wheeled Level of assistance: Complete Independence Comments: good form                                                                                                                                 TREATMENT DATE:  09/22/24 Initial evaluation completed of L knee followed by instruction and trial set of HEP.    PATIENT EDUCATION:  Education details: HEP Person educated: Patient Education method:  Explanation, Demonstration, Tactile cues, and Verbal cues Education comprehension: verbalized understanding  HOME EXERCISE PROGRAM: Access Code: [email or web address] URL: [email or web address] Date: 09/22/2024 Prepared by: Burnard Meth  Exercises - Supine Quad Set  - 2 x daily - 7 x weekly - 2 sets - 10 reps - 3 hold - Supine Active Straight Leg Raise  - 2 x daily - 7 x weekly - 2 sets - 10 reps - Supine Heel Slides  - 2 x daily - 7 x weekly - 2 sets - 10 reps - Heel Raises with Counter Support  - 2 x daily - 7 x weekly - 2 sets - 10 reps - Mini Squat with Counter Support  - 2 x daily - 7 x weekly - 2 sets - 10 reps - Standing March with Counter Support  - 2 x daily - 7 x weekly - 2 sets - 10 reps  ASSESSMENT:  CLINICAL IMPRESSION: Patient is a 73 y.o. female who was seen today for physical therapy evaluation and treatment for s/p TKA Left.   She presents with altered gait, decreased ROM, decreased strength and pain.  She would benefit from skilled PT to address these deficits and return to previous LOA.  OBJECTIVE IMPAIRMENTS: decreased balance, decreased endurance, difficulty walking, decreased ROM, decreased strength, hypomobility, increased edema, impaired flexibility, and pain.   ACTIVITY LIMITATIONS: bending, sitting, standing, squatting, stairs, transfers, bed mobility, bathing, and toileting  PARTICIPATION LIMITATIONS: meal prep, driving, and community activity  PERSONAL FACTORS: --are also affecting patient's functional outcome.   REHAB POTENTIAL: Good  CLINICAL DECISION MAKING: Stable/uncomplicated  EVALUATION COMPLEXITY: Moderate   GOALS: Goals  reviewed with patient? Yes  SHORT TERM GOALS: Target date: 10/20/2024 4 weeks    Pt to be independent with HEP. Baseline: Goal status: INITIAL  2.  Decrease pain by 1 level. Baseline:  Goal status: INITIAL  3.  Pt to be able to ambulate short community distances with SPC. Baseline: RW Goal status: INITIAL  LONG TERM GOALS: Target date: 12/15/2024  12 weeks    Pt to be independent with self progressive HEP at discharge.   Baseline:  Goal status: INITIAL  2.  Decrease pain to max 2/10 with all activities. Baseline:  Goal status: INITIAL  3.  Increase AROM to 0> 105+L knee. Baseline: 84 degrees Goal status: INITIAL  4.  Increase strength to at least 4>4+/5 in L LE. Baseline: 3 Goal status: INITIAL  5.  Able to ambulate community distances without an AD. Baseline:  Goal status: INITIAL  6.  Increase score of PSFS to > 5. Baseline:  Goal status: INITIAL   PLAN:  PT FREQUENCY: 2x/week  PT DURATION: 10 weeks  PLANNED INTERVENTIONS: 97164- PT Re-evaluation, 97110-Therapeutic exercises, 97530- Therapeutic activity, 97112- Neuromuscular re-education, 97535- Self Care, 02859- Manual therapy, [phone]- Gait training, [phone]- Aquatic Therapy, [phone]- Electrical stimulation (unattended), 97016- Vasopneumatic device, Patient/Family education, Balance training, Stair training, Joint mobilization, and Scar mobilization  PLAN FOR NEXT SESSION: Start on Nustep and review HEP.   Burnard CHRISTELLA Meth, PT 09/22/2024, 4:28 PM    Date of referral: 09/08/24 Referring provider: Jerona Sage MD Referring diagnosis?  M17.12 (ICD-10-CM) - Osteoarthritis of left knee, unspecified osteoarthritis type  Z96.652 (ICD-10-CM) - S/P total knee arthroplasty, left   Treatment diagnosis? (if different than referring diagnosis) M25.562  R60.0  R26.2  F37.18M73.10  What was this (referring dx) caused by? Arthritis  Nature of Condition: Chronic (continuous duration > 3 months)   Laterality:  Lt  Current Functional Measure  Score: Patient Specific Functional Scale 0  Objective measurements identify impairments when they are compared to normal values, the uninvolved extremity, and prior level of function.  [x]  Yes  []  No  Objective assessment of functional ability: Severe functional limitations   Briefly describe symptoms: pain, decreased ROM, decreased strength  How did symptoms start: Pain in knee started in the spring  Average pain intensity:  Last 24 hours: 6/10  Past week: 9/10  How often does the pt experience symptoms? Constantly  How much have the symptoms interfered with usual daily activities? Extremely  How has condition changed since care began at this facility? NA - initial visit  In general, how is the patients overall health? Good   BACK PAIN (STarT Back Screening Tool) No

## 2024-09-22 ENCOUNTER — Telehealth: Payer: Self-pay

## 2024-09-22 ENCOUNTER — Other Ambulatory Visit: Payer: Self-pay

## 2024-09-22 ENCOUNTER — Ambulatory Visit

## 2024-09-22 DIAGNOSIS — R262 Difficulty in walking, not elsewhere classified: Secondary | ICD-10-CM | POA: Diagnosis not present

## 2024-09-22 DIAGNOSIS — R2689 Other abnormalities of gait and mobility: Secondary | ICD-10-CM

## 2024-09-22 DIAGNOSIS — R600 Localized edema: Secondary | ICD-10-CM

## 2024-09-22 DIAGNOSIS — M25562 Pain in left knee: Secondary | ICD-10-CM

## 2024-09-22 DIAGNOSIS — M6281 Muscle weakness (generalized): Secondary | ICD-10-CM

## 2024-09-22 NOTE — Telephone Encounter (Signed)
 Patient would like to know if she could wear some compression stocking or socks.  Stated that she has some swelling around her left ankle.  Cb#  [phone].   Please advise. Thank you.

## 2024-09-22 NOTE — Telephone Encounter (Signed)
 Called patient back to let her know that compression stockings (knee high) would be fine to wear.

## 2024-09-23 NOTE — Therapy (Signed)
 OUTPATIENT PHYSICAL THERAPY LOWER EXTREMITY TREATMENT   Patient Name: Madison Mosley MRN: [id number] DOB:February 14, 1951, 73 y.o., female Today's Date: 09/26/2024  END OF SESSION:  PT End of Session - 09/26/24 1447     Visit Number 2    Number of Visits 25    Date for Recertification  12/15/24    Authorization Type [employer]HC Medicare    PT Start Time 1347    PT Stop Time 1435    PT Time Calculation (min) 48 min    Activity Tolerance Patient tolerated treatment well    Behavior During Therapy WFL for tasks assessed/performed           Past Medical History:  Diagnosis Date   Arthritis    CAD (coronary artery disease)    stents   GERD (gastroesophageal reflux disease)    Headache    Hyperlipidemia    Hypertension    Past Surgical History:  Procedure Laterality Date   CARDIAC CATHETERIZATION  11/18/1999   Percutaneous revascularization precedure with angioplasty to the proximal LAD and first diagonal    CESAREAN SECTION     CHOLECYSTECTOMY     CORONARY STENT PLACEMENT     KNEE ARTHROSCOPY WITH MEDIAL MENISECTOMY Left 05/06/2024   Procedure: LEFT KNEE ARTHROSCOPY WITH MEDIAL MENISCECTOMY;  Surgeon: Harden Jerona GAILS, MD;  Location: [facility] OR;  Service: Orthopedics;  Laterality: Left;   PARTIAL HYSTERECTOMY     TONSILLECTOMY  11/17/1958   TOTAL KNEE ARTHROPLASTY Left 09/07/2024   Procedure: ARTHROPLASTY, KNEE, TOTAL LEFT;  Surgeon: Harden Jerona GAILS, MD;  Location: [facility] OR;  Service: Orthopedics;  Laterality: Left;   Patient Active Problem List   Diagnosis Date Noted   Arthritis of left knee 09/07/2024   Osteoarthritis of left knee, unspecified osteoarthritis type 09/07/2024   Osteochondral defect of femoral condyle 05/06/2024   Right knee meniscal tear 10/02/2020   Unilateral primary osteoarthritis, right knee 04/25/2020   Low back pain 03/21/2020   Statin myopathy 11/16/2019   Atrophic vaginitis 06/18/2017   Sprain of calcaneofibular ligament of left ankle 01/16/2017   Obesity (BMI  30.0-34.9) 01/05/2015   CAD S/P percutaneous coronary angioplasty 04/21/2013   Hypercholesterolemia 04/21/2013   Essential hypertension 04/21/2013    PCP: Clarice Nottingham, MD   REFERRING PROVIDER: Harden Jerona GAILS, MD   REFERRING DIAG:  [phone] (ICD-10-CM) - Osteoarthritis of left knee, unspecified osteoarthritis type  Z96.652 (ICD-10-CM) - S/P total knee arthroplasty, left    THERAPY DIAG:  Acute pain of left knee  Localized edema  Difficulty in walking, not elsewhere classified  Muscle weakness (generalized)  Other abnormalities of gait and mobility  Rationale for Evaluation and Treatment: Rehabilitation  ONSET DATE: 09/07/24 L TKA  SUBJECTIVE:   SUBJECTIVE STATEMENT: Patient reports being active yesterday and staying on leg a lot.    PERTINENT HISTORY: -- L knee pain started in April.  She had a meniscus surgery in June but pain continued.  Now s/p TKA.  No CPM.  Had 6 HHPT visits.  PAIN:  Are you having pain? Yes: NPRS scale: 9/10 when woke up this morning, 7/10 currently  Pain location: L all over Pain description: dull Aggravating factors: motion, bending, extending  Relieving factors: medication, ice  PRECAUTIONS: None  RED FLAGS: None   WEIGHT BEARING RESTRICTIONS: No  FALLS:  Has patient fallen in last 6 months? No  LIVING ENVIRONMENT: Lives with: lives with their spouse Lives in: House/apartment Stairs: Yes: External: 2.5 steps; on right going up Has following equipment at  home: Vannie - 2 wheeled  OCCUPATION: retired   PLOF: Independent  PATIENT GOALS: decrease pain   NEXT MD VISIT: not listed  OBJECTIVE:  Note: Objective measures were completed at Evaluation unless otherwise noted.  DIAGNOSTIC FINDINGS: MRI 04/12/24  IMPRESSION: Tricompartmental osteoarthrosis. Mild to moderate chondromalacia with moderate reactive joint effusion.   Moderate radial tear at the root of the medial meniscus with medial displacement of body. There is  second likely radial tear at the junction the body anterior horn of the medial meniscus. See above for more detail.  PATIENT SURVEYS:  PSFS: THE PATIENT SPECIFIC FUNCTIONAL SCALE  Place score of 0-10 (0 = unable to perform activity and 10 = able to perform activity at the same level as before injury or problem)  Activity Date: 09/22/24    2 Steps no railing 0    2.driving 0    3.Shower independently 0    4.      Total Score 0      Total Score = Sum of activity scores/number of activities  Minimally Detectable Change: 3 points (for single activity); 2 points (for average score)  [employer] [facility] [facility] (nd). The Patient Specific Functional Scale . Retrieved from [email or web address]   COGNITION: Overall cognitive status: Within functional limits for tasks assessed     SENSATION: WFL  EDEMA:  Circumferential: 55 cm  MUSCLE LENGTH: Hamstrings:  Left mod restriction    POSTURE: No Significant postural limitations  PALPATION: 2+ tenderness at anterior knee   LOWER EXTREMITY ROM:  Active/Passive ROM Right eval Left eval  Hip flexion    Hip extension    Hip abduction    Hip adduction    Hip internal rotation    Hip external rotation    Knee flexion  80/84  Knee extension  +9/+5  Ankle dorsiflexion    Ankle plantarflexion    Ankle inversion    Ankle eversion     (Blank rows = not tested)  LOWER EXTREMITY MMT:  MMT Right eval Left eval  Hip flexion  4  Hip extension    Hip abduction  3+  Hip adduction    Hip internal rotation    Hip external rotation    Knee flexion  3  Knee extension  3  Ankle dorsiflexion    Ankle plantarflexion    Ankle inversion    Ankle eversion     (Blank rows = not tested)  LOWER EXTREMITY SPECIAL TESTS:  S/P no special tests completed  FUNCTIONAL TESTS:  5 times sit to stand: 32 sec  GAIT: Distance walked: household Assistive device utilized: Environmental Consultant - 2  wheeled Level of assistance: Complete Independence Comments: good form                                                                                                                                TREATMENT DATE:  09/26/2024 TherEx:  Nustep level 3 with bilat UE and  LE for 8 minutes  Slant board gastroc stretch 3x30s  Calf raises with bilat UE support 2x10 Seated knee extension/flexion x10  Supine quad sets with towel under ankle and slight PT overpressure 1x10 with 3s holds  Supine straight leg raise 2x10  Supine heel slides 1x10   Manual:  Seated IR/distraction with knee flexion with PT overpressure into max flexion and PROM into knee extension   Vaso:  Lt LE elevated on wedge with medium compression for 10 minutes at 34 deg  09/22/24 Initial evaluation completed of L knee followed by instruction and trial set of HEP.    PATIENT EDUCATION:  Education details: HEP Person educated: Patient Education method: Programmer, Multimedia, Demonstration, Actor cues, and Verbal cues Education comprehension: verbalized understanding  HOME EXERCISE PROGRAM: Access Code: [email or web address] URL: [email or web address] Date: 09/22/2024 Prepared by: Burnard Meth  Exercises - Supine Quad Set  - 2 x daily - 7 x weekly - 2 sets - 10 reps - 3 hold - Supine Active Straight Leg Raise  - 2 x daily - 7 x weekly - 2 sets - 10 reps - Supine Heel Slides  - 2 x daily - 7 x weekly - 2 sets - 10 reps - Heel Raises with Counter Support  - 2 x daily - 7 x weekly - 2 sets - 10 reps - Mini Squat with Counter Support  - 2 x daily - 7 x weekly - 2 sets - 10 reps - Standing March with Counter Support  - 2 x daily - 7 x weekly - 2 sets - 10 reps  ASSESSMENT:  CLINICAL IMPRESSION: Patient arrived to session noting continued higher pain levels, though feels that she is slowly improving. Patient tolerated all activities this date with slight increase in pain levels especially with supine heel slides. Patient will  continue to benefit from skilled PT.   OBJECTIVE IMPAIRMENTS: decreased balance, decreased endurance, difficulty walking, decreased ROM, decreased strength, hypomobility, increased edema, impaired flexibility, and pain.   ACTIVITY LIMITATIONS: bending, sitting, standing, squatting, stairs, transfers, bed mobility, bathing, and toileting  PARTICIPATION LIMITATIONS: meal prep, driving, and community activity  PERSONAL FACTORS: --are also affecting patient's functional outcome.   REHAB POTENTIAL: Good  CLINICAL DECISION MAKING: Stable/uncomplicated  EVALUATION COMPLEXITY: Moderate   GOALS: Goals reviewed with patient? Yes  SHORT TERM GOALS: Target date: 10/20/2024 4 weeks    Pt to be independent with HEP. Baseline: Goal status: INITIAL  2.  Decrease pain by 1 level. Baseline:  Goal status: INITIAL  3.  Pt to be able to ambulate short community distances with SPC. Baseline: RW Goal status: INITIAL  LONG TERM GOALS: Target date: 12/15/2024  12 weeks    Pt to be independent with self progressive HEP at discharge.   Baseline:  Goal status: INITIAL  2.  Decrease pain to max 2/10 with all activities. Baseline:  Goal status: INITIAL  3.  Increase AROM to 0> 105+L knee. Baseline: 84 degrees Goal status: INITIAL  4.  Increase strength to at least 4>4+/5 in L LE. Baseline: 3 Goal status: INITIAL  5.  Able to ambulate community distances without an AD. Baseline:  Goal status: INITIAL  6.  Increase score of PSFS to > 5. Baseline:  Goal status: INITIAL   PLAN:  PT FREQUENCY: 2x/week  PT DURATION: 10 weeks  PLANNED INTERVENTIONS: 97164- PT Re-evaluation, 97110-Therapeutic exercises, 97530- Therapeutic activity, [id number]- Neuromuscular re-education, 97535- Self Care, 02859- Manual therapy, [id number]- Gait training, [phone]- Aquatic Therapy, [phone]- Electrical stimulation (unattended), 97016- Vasopneumatic  device, Patient/Family education, Balance training, Stair training, Joint  mobilization, and Scar mobilization  PLAN FOR NEXT SESSION: Start on [place], [place] strengthening, knee flexion/extension ROM   Susannah Daring, PT, DPT 09/26/24 2:48 PM     Date of referral: 09/08/24 Referring provider: Jerona Sage MD Referring diagnosis?  M17.12 (ICD-10-CM) - Osteoarthritis of left knee, unspecified osteoarthritis type  Z96.652 (ICD-10-CM) - S/P total knee arthroplasty, left   Treatment diagnosis? (if different than referring diagnosis) M25.562  R60.0  R26.2  F37.18M73.10  What was this (referring dx) caused by? Arthritis  Nature of Condition: Chronic (continuous duration > 3 months)   Laterality: Lt  Current Functional Measure Score: Patient Specific Functional Scale 0  Objective measurements identify impairments when they are compared to normal values, the uninvolved extremity, and prior level of function.  [x]  Yes  []  No  Objective assessment of functional ability: Severe functional limitations   Briefly describe symptoms: pain, decreased ROM, decreased strength  How did symptoms start: Pain in knee started in the spring  Average pain intensity:  Last 24 hours: 6/10  Past week: 9/10  How often does the pt experience symptoms? Constantly  How much have the symptoms interfered with usual daily activities? Extremely  How has condition changed since care began at this facility? NA - initial visit  In general, how is the patients overall health? Good   BACK PAIN (STarT Back Screening Tool) No

## 2024-09-26 ENCOUNTER — Ambulatory Visit

## 2024-09-26 DIAGNOSIS — R262 Difficulty in walking, not elsewhere classified: Secondary | ICD-10-CM

## 2024-09-26 DIAGNOSIS — M6281 Muscle weakness (generalized): Secondary | ICD-10-CM | POA: Diagnosis not present

## 2024-09-26 DIAGNOSIS — M25562 Pain in left knee: Secondary | ICD-10-CM

## 2024-09-26 DIAGNOSIS — R600 Localized edema: Secondary | ICD-10-CM

## 2024-09-26 DIAGNOSIS — R2689 Other abnormalities of gait and mobility: Secondary | ICD-10-CM

## 2024-09-28 NOTE — Therapy (Signed)
 OUTPATIENT PHYSICAL THERAPY LOWER EXTREMITY TREATMENT   Patient Name: Madison Mosley MRN: [id number] DOB:Apr 02, 1951, 73 y.o., female Today's Date: 09/29/2024  END OF SESSION:  PT End of Session - 09/29/24 1427     Visit Number 3    Number of Visits 25    Date for Recertification  12/15/24    Authorization Type [employer]    Progress Note Due on Visit 10    PT Start Time 1345    PT Stop Time 1433    PT Time Calculation (min) 48 min    Activity Tolerance Patient tolerated treatment well    Behavior During Therapy WFL for tasks assessed/performed            Past Medical History:  Diagnosis Date   Arthritis    CAD (coronary artery disease)    stents   GERD (gastroesophageal reflux disease)    Headache    Hyperlipidemia    Hypertension    Past Surgical History:  Procedure Laterality Date   CARDIAC CATHETERIZATION  11/18/1999   Percutaneous revascularization precedure with angioplasty to the proximal LAD and first diagonal    CESAREAN SECTION     CHOLECYSTECTOMY     CORONARY STENT PLACEMENT     KNEE ARTHROSCOPY WITH MEDIAL MENISECTOMY Left 05/06/2024   Procedure: LEFT KNEE ARTHROSCOPY WITH MEDIAL MENISCECTOMY;  Surgeon: Harden Jerona GAILS, MD;  Location: [facility] OR;  Service: Orthopedics;  Laterality: Left;   PARTIAL HYSTERECTOMY     TONSILLECTOMY  11/17/1958   TOTAL KNEE ARTHROPLASTY Left 09/07/2024   Procedure: ARTHROPLASTY, KNEE, TOTAL LEFT;  Surgeon: Harden Jerona GAILS, MD;  Location: [facility] OR;  Service: Orthopedics;  Laterality: Left;   Patient Active Problem List   Diagnosis Date Noted   Arthritis of left knee 09/07/2024   Osteoarthritis of left knee, unspecified osteoarthritis type 09/07/2024   Osteochondral defect of femoral condyle 05/06/2024   Right knee meniscal tear 10/02/2020   Unilateral primary osteoarthritis, right knee 04/25/2020   Low back pain 03/21/2020   Statin myopathy 11/16/2019   Atrophic vaginitis 06/18/2017   Sprain of calcaneofibular ligament of  left ankle 01/16/2017   Obesity (BMI 30.0-34.9) 01/05/2015   CAD S/P percutaneous coronary angioplasty 04/21/2013   Hypercholesterolemia 04/21/2013   Essential hypertension 04/21/2013    PCP: Clarice Nottingham, MD   REFERRING PROVIDER: Harden Jerona GAILS, MD   REFERRING DIAG:  [phone] (ICD-10-CM) - Osteoarthritis of left knee, unspecified osteoarthritis type  Z96.652 (ICD-10-CM) - S/P total knee arthroplasty, left    THERAPY DIAG:  Acute pain of left knee  Localized edema  Difficulty in walking, not elsewhere classified  Muscle weakness (generalized)  Other abnormalities of gait and mobility  Rationale for Evaluation and Treatment: Rehabilitation  ONSET DATE: 09/07/24 L TKA  SUBJECTIVE:   SUBJECTIVE STATEMENT: Patient reports pain and stiffness in the knee.  Reports stretching out the time between pain meds.  PERTINENT HISTORY: -- L knee pain started in April.  She had a meniscus surgery in June but pain continued.  Now s/p TKA.  No CPM.  Had 6 HHPT visits.  PAIN:  Are you having pain? Yes: NPRS scale: 5/10 currently  Pain location: L all over Pain description: dull Aggravating factors: motion, bending, extending  Relieving factors: medication, ice  PRECAUTIONS: None  RED FLAGS: None   WEIGHT BEARING RESTRICTIONS: No  FALLS:  Has patient fallen in last 6 months? No  LIVING ENVIRONMENT: Lives with: lives with their spouse Lives in: House/apartment Stairs: Yes: External: 2.5 steps;  on right going up Has following equipment at home: Vannie - 2 wheeled  OCCUPATION: retired   PLOF: Independent  PATIENT GOALS: decrease pain   NEXT MD VISIT: not listed  OBJECTIVE:  Note: Objective measures were completed at Evaluation unless otherwise noted.  DIAGNOSTIC FINDINGS: MRI 04/12/24  IMPRESSION: Tricompartmental osteoarthrosis. Mild to moderate chondromalacia with moderate reactive joint effusion.   Moderate radial tear at the root of the medial meniscus with  medial displacement of body. There is second likely radial tear at the junction the body anterior horn of the medial meniscus. See above for more detail.  PATIENT SURVEYS:  PSFS: THE PATIENT SPECIFIC FUNCTIONAL SCALE  Place score of 0-10 (0 = unable to perform activity and 10 = able to perform activity at the same level as before injury or problem)  Activity Date: 09/22/24    2 Steps no railing 0    2.driving 0    3.Shower independently 0    4.      Total Score 0      Total Score = Sum of activity scores/number of activities  Minimally Detectable Change: 3 points (for single activity); 2 points (for average score)  [employer] [employer] [facility] (nd). The Patient Specific Functional Scale . Retrieved from [email or web address]   COGNITION: Overall cognitive status: Within functional limits for tasks assessed     SENSATION: WFL  EDEMA:  Circumferential: 55 cm  MUSCLE LENGTH: Hamstrings:  Left mod restriction    POSTURE: No Significant postural limitations  PALPATION: 2+ tenderness at anterior knee   LOWER EXTREMITY ROM:  Active/Passive ROM Right eval Left eval Left  09/29/24  Hip flexion     Hip extension     Hip abduction     Hip adduction     Hip internal rotation     Hip external rotation     Knee flexion  80/84 90/92  Knee extension  +9/+5 +8AA  Ankle dorsiflexion     Ankle plantarflexion     Ankle inversion     Ankle eversion      (Blank rows = not tested)  LOWER EXTREMITY MMT:  MMT Right eval Left eval  Hip flexion  4  Hip extension    Hip abduction  3+  Hip adduction    Hip internal rotation    Hip external rotation    Knee flexion  3  Knee extension  3  Ankle dorsiflexion    Ankle plantarflexion    Ankle inversion    Ankle eversion     (Blank rows = not tested)  LOWER EXTREMITY SPECIAL TESTS:  S/P no special tests completed  FUNCTIONAL TESTS:  5 times sit to stand: 32  sec  GAIT: Distance walked: household Assistive device utilized: Environmental Consultant - 2 wheeled Level of assistance: Complete Independence Comments: good form  TREATMENT DATE: L Knee 09/29/24/ TherEx:  Nustep level 5 with bilat UE and LE for 8 minutes  Slant board gastroc stretch 3x30s  Supine quad sets with towel under ankle and slight PT overpressure 15x Supine straight leg raise 2x10  Mini Squats 2x10 Heel Raises 2x10 Prone extension stretch for 3 minutes  Manual:  Seated IR/distraction with knee flexion with PT overpressure into max flexion and PROM into knee extension   Vaso:  Lt LE elevated on wedge with medium compression for 10 minutes at 34 deg   09/26/2024 TherEx:  Nustep level 3 with bilat UE and LE for 8 minutes  Slant board gastroc stretch 3x30s  Calf raises with bilat UE support 2x10 Seated knee extension/flexion x10  Supine quad sets with towel under ankle and slight PT overpressure 1x10 with 3s holds  Supine straight leg raise 2x10  Supine heel slides 1x10   Manual:  Seated IR/distraction with knee flexion with PT overpressure into max flexion and PROM into knee extension   Vaso:  Lt LE elevated on wedge with medium compression for 10 minutes at 34 deg  09/22/24 Initial evaluation completed of L knee followed by instruction and trial set of HEP.    PATIENT EDUCATION:  Education details: HEP Person educated: Patient Education method: Programmer, Multimedia, Demonstration, Actor cues, and Verbal cues Education comprehension: verbalized understanding  HOME EXERCISE PROGRAM: Access Code: [email or web address] URL: [email or web address] Date: 09/22/2024 Prepared by: Burnard Meth  Exercises - Supine Quad Set  - 2 x daily - 7 x weekly - 2 sets - 10 reps - 3  - Supine Active Straight Leg Raise  - 2 x daily - 7 x weekly - 2 sets - 10 reps -  Supine Heel Slides  - 2 x daily - 7 x weekly - 2 sets - 10 reps - Heel Raises with Counter Support  - 2 x daily - 7 x weekly - 2 sets - 10 reps - Mini Squat with Counter Support  - 2 x daily - 7 x weekly - 2 sets - 10 reps - Standing March with Counter Support  - 2 x daily - 7 x weekly - 2 sets - 10 reps  ASSESSMENT:  CLINICAL IMPRESSION: Patient has started using cane for ambulation.  Needs VC for heel strike and knee extension on the L .  Difficulty with knee extension in stride.   OBJECTIVE IMPAIRMENTS: decreased balance, decreased endurance, difficulty walking, decreased ROM, decreased strength, hypomobility, increased edema, impaired flexibility, and pain.   ACTIVITY LIMITATIONS: bending, sitting, standing, squatting, stairs, transfers, bed mobility, bathing, and toileting  PARTICIPATION LIMITATIONS: meal prep, driving, and community activity  PERSONAL FACTORS: --are also affecting patient's functional outcome.   REHAB POTENTIAL: Good  CLINICAL DECISION MAKING: Stable/uncomplicated  EVALUATION COMPLEXITY: Moderate   GOALS: Goals reviewed with patient? Yes  SHORT TERM GOALS: Target date: 10/20/2024 4 weeks    Pt to be independent with HEP. Baseline: Goal status: INITIAL  2.  Decrease pain by 1 level. Baseline:  Goal status: INITIAL  3.  Pt to be able to ambulate short community distances with SPC. Baseline: RW Goal status: INITIAL  LONG TERM GOALS: Target date: 12/15/2024  12 weeks    Pt to be independent with self progressive HEP at discharge.   Baseline:  Goal status: INITIAL  2.  Decrease pain to max 2/10 with all activities. Baseline:  Goal status: INITIAL  3.  Increase AROM to 0> 105+L knee. Baseline: 84 degrees Goal status: INITIAL  4.  Increase strength to at least 4>4+/5 in L LE. Baseline: 3 Goal status: INITIAL  5.  Able to ambulate community distances without an AD. Baseline:  Goal status: INITIAL  6.  Increase score of PSFS to >  5. Baseline:  Goal status: INITIAL   PLAN:  PT FREQUENCY: 2x/week  PT DURATION: 10 weeks  PLANNED INTERVENTIONS: 97164- PT Re-evaluation, 97110-Therapeutic exercises, 97530- Therapeutic activity, 97112- Neuromuscular re-education, 97535- Self Care, [id number]- Manual therapy, [phone]- Gait training, [phone]- Aquatic Therapy, [phone]- Electrical stimulation (unattended), 97016- Vasopneumatic device, Patient/Family education, Balance training, Stair training, Joint mobilization, and Scar mobilization  PLAN FOR NEXT SESSION: LE strengthening, knee flexion/extension ROM  Burnard Meth, PT 09/29/24  2:29 PM     Date of referral: 09/08/24 Referring provider: Jerona Sage MD Referring diagnosis?  M17.12 (ICD-10-CM) - Osteoarthritis of left knee, unspecified osteoarthritis type  Z96.652 (ICD-10-CM) - S/P total knee arthroplasty, left   Treatment diagnosis? (if different than referring diagnosis) M25.562  R60.0  R26.2  F37.18M73.10  What was this (referring dx) caused by? Arthritis  Nature of Condition: Chronic (continuous duration > 3 months)   Laterality: Lt  Current Functional Measure Score: Patient Specific Functional Scale 0  Objective measurements identify impairments when they are compared to normal values, the uninvolved extremity, and prior level of function.  [x]  Yes  []  No  Objective assessment of functional ability: Severe functional limitations   Briefly describe symptoms: pain, decreased ROM, decreased strength  How did symptoms start: Pain in knee started in the spring  Average pain intensity:  Last 24 hours: 6/10  Past week: 9/10  How often does the pt experience symptoms? Constantly  How much have the symptoms interfered with usual daily activities? Extremely  How has condition changed since care began at this facility? NA - initial visit  In general, how is the patients overall health? Good   BACK PAIN (STarT Back Screening Tool) No

## 2024-09-29 ENCOUNTER — Other Ambulatory Visit: Payer: Self-pay | Admitting: Orthopedic Surgery

## 2024-09-29 ENCOUNTER — Ambulatory Visit (INDEPENDENT_AMBULATORY_CARE_PROVIDER_SITE_OTHER)

## 2024-09-29 ENCOUNTER — Telehealth: Payer: Self-pay | Admitting: Family

## 2024-09-29 ENCOUNTER — Telehealth: Payer: Self-pay | Admitting: Orthopedic Surgery

## 2024-09-29 DIAGNOSIS — M25562 Pain in left knee: Secondary | ICD-10-CM | POA: Diagnosis not present

## 2024-09-29 DIAGNOSIS — R262 Difficulty in walking, not elsewhere classified: Secondary | ICD-10-CM

## 2024-09-29 DIAGNOSIS — R2689 Other abnormalities of gait and mobility: Secondary | ICD-10-CM

## 2024-09-29 DIAGNOSIS — M6281 Muscle weakness (generalized): Secondary | ICD-10-CM | POA: Diagnosis not present

## 2024-09-29 DIAGNOSIS — R600 Localized edema: Secondary | ICD-10-CM

## 2024-09-29 MED ORDER — HYDROCODONE-ACETAMINOPHEN 5-325 MG PO TABS: 1.0000 | ORAL_TABLET | ORAL | 0 refills | Status: DC | PRN

## 2024-09-29 NOTE — Telephone Encounter (Signed)
 Pt came into office asking for hydrocodone . Pt states pharmacy wont release her pain medication. Pt asking for every four hours due to her pain. She has 3 pills left. Can you resubmit to pharmacy that she can take 1 every four hours but need more quantity. Please call pt about this at [phone].

## 2024-09-29 NOTE — Telephone Encounter (Signed)
 Please see newest note on this matter. I sent to Dr. Harden for review.

## 2024-09-29 NOTE — Telephone Encounter (Signed)
 Patient called and said she needs authorization for the hydrocodone  at [facility]. She also have dental appointment and needs the antibiotics. CB#[phone]

## 2024-09-29 NOTE — Telephone Encounter (Signed)
 Called pt back to let her know that Dr. Harden does not require any antibiotics for dental work after a total joint replacement. Also asked her to specify on the hydrocodone . I don't see any request for prior authorization from her insurance and the rx was last filled on 09/20/24 that looks like it was picked up. Need to know if this is supposed to be a refill request. Waiting for call back. I had to leave a VM okay per her DPR.

## 2024-09-30 NOTE — Telephone Encounter (Signed)
Pt informed of the below. °

## 2024-10-03 ENCOUNTER — Telehealth: Payer: Self-pay | Admitting: *Deleted

## 2024-10-03 NOTE — Telephone Encounter (Signed)
 Patient called this morning and states she was looking at her X-ray from 09/20/24 done here in office with her first post op appointment and noticed what looked like a fracture of her fibula. She did call the on call and spoke to Dr. Georgina she says, who confirmed. She has had no one mention this to her and wants to know what to do or what happened. Thanks.

## 2024-10-03 NOTE — Therapy (Signed)
 OUTPATIENT PHYSICAL THERAPY LOWER EXTREMITY TREATMENT   Patient Name: Madison Mosley MRN: [id number] DOB:1951-05-11, 73 y.o., female Today's Date: 10/04/2024  END OF SESSION:  PT End of Session - 10/04/24 0932     Visit Number 4    Number of Visits 25    Date for Recertification  12/15/24    Authorization Type [employer]    Progress Note Due on Visit 10    PT Start Time 0933    PT Stop Time 1013    PT Time Calculation (min) 40 min    Activity Tolerance Patient tolerated treatment well    Behavior During Therapy WFL for tasks assessed/performed             Past Medical History:  Diagnosis Date   Arthritis    CAD (coronary artery disease)    stents   GERD (gastroesophageal reflux disease)    Headache    Hyperlipidemia    Hypertension    Past Surgical History:  Procedure Laterality Date   CARDIAC CATHETERIZATION  11/18/1999   Percutaneous revascularization precedure with angioplasty to the proximal LAD and first diagonal    CESAREAN SECTION     CHOLECYSTECTOMY     CORONARY STENT PLACEMENT     KNEE ARTHROSCOPY WITH MEDIAL MENISECTOMY Left 05/06/2024   Procedure: LEFT KNEE ARTHROSCOPY WITH MEDIAL MENISCECTOMY;  Surgeon: Harden Jerona GAILS, MD;  Location: [facility] OR;  Service: Orthopedics;  Laterality: Left;   PARTIAL HYSTERECTOMY     TONSILLECTOMY  11/17/1958   TOTAL KNEE ARTHROPLASTY Left 09/07/2024   Procedure: ARTHROPLASTY, KNEE, TOTAL LEFT;  Surgeon: Harden Jerona GAILS, MD;  Location: [facility];  Service: Orthopedics;  Laterality: Left;   Patient Active Problem List   Diagnosis Date Noted   Arthritis of left knee 09/07/2024   Osteoarthritis of left knee, unspecified osteoarthritis type 09/07/2024   Osteochondral defect of femoral condyle 05/06/2024   Right knee meniscal tear 10/02/2020   Unilateral primary osteoarthritis, right knee 04/25/2020   Low back pain 03/21/2020   Statin myopathy 11/16/2019   Atrophic vaginitis 06/18/2017   Sprain of calcaneofibular ligament of  left ankle 01/16/2017   Obesity (BMI 30.0-34.9) 01/05/2015   CAD S/P percutaneous coronary angioplasty 04/21/2013   Hypercholesterolemia 04/21/2013   Essential hypertension 04/21/2013    PCP: Clarice Nottingham, MD   REFERRING PROVIDER: Harden Jerona GAILS, MD   REFERRING DIAG:  [phone] (ICD-10-CM) - Osteoarthritis of left knee, unspecified osteoarthritis type  Z96.652 (ICD-10-CM) - S/P total knee arthroplasty, left    THERAPY DIAG:  Acute pain of left knee  Localized edema  Difficulty in walking, not elsewhere classified  Muscle weakness (generalized)  Other abnormalities of gait and mobility  Rationale for Evaluation and Treatment: Rehabilitation  ONSET DATE: 09/07/24 L TKA  SUBJECTIVE:   SUBJECTIVE STATEMENT: Patient reports an incidental fibular fracture on Lt LE found after TKA and is unable to complete recumbent bike for 2 weeks or have strenuous manual performed.   PERTINENT HISTORY: -- L knee pain started in April.  She had a meniscus surgery in June but pain continued.  Now s/p TKA.  No CPM.  Had 6 HHPT visits.  PAIN:  Are you having pain? Yes: NPRS scale: 7-8/10 this session  Pain location: L all over Pain description: dull Aggravating factors: motion, bending, extending  Relieving factors: medication, ice  PRECAUTIONS: None  RED FLAGS: None   WEIGHT BEARING RESTRICTIONS: No  FALLS:  Has patient fallen in last 6 months? No  LIVING ENVIRONMENT: Lives  with: lives with their spouse Lives in: House/apartment Stairs: Yes: External: 2.5 steps; on right going up Has following equipment at home: Vannie - 2 wheeled  OCCUPATION: retired   PLOF: Independent  PATIENT GOALS: decrease pain   NEXT MD VISIT: not listed  OBJECTIVE:  Note: Objective measures were completed at Evaluation unless otherwise noted.  DIAGNOSTIC FINDINGS: MRI 04/12/24  IMPRESSION: Tricompartmental osteoarthrosis. Mild to moderate chondromalacia with moderate reactive joint  effusion.   Moderate radial tear at the root of the medial meniscus with medial displacement of body. There is second likely radial tear at the junction the body anterior horn of the medial meniscus. See above for more detail.  PATIENT SURVEYS:  PSFS: THE PATIENT SPECIFIC FUNCTIONAL SCALE  Place score of 0-10 (0 = unable to perform activity and 10 = able to perform activity at the same level as before injury or problem)  Activity Date: 09/22/24    2 Steps no railing 0    2.driving 0    3.Shower independently 0    4.      Total Score 0      Total Score = Sum of activity scores/number of activities  Minimally Detectable Change: 3 points (for single activity); 2 points (for average score)  [employer] [facility] (nd). The Patient Specific Functional Scale . Retrieved from [email or web address]   COGNITION: Overall cognitive status: Within functional limits for tasks assessed     SENSATION: WFL  EDEMA:  Circumferential: 55 cm  MUSCLE LENGTH: Hamstrings:  Left mod restriction    POSTURE: No Significant postural limitations  PALPATION: 2+ tenderness at anterior knee   LOWER EXTREMITY ROM:  Active/Passive ROM Right eval Left eval Left  09/29/24  Hip flexion     Hip extension     Hip abduction     Hip adduction     Hip internal rotation     Hip external rotation     Knee flexion  80/84 90/92  Knee extension  +9/+5 +8AA  Ankle dorsiflexion     Ankle plantarflexion     Ankle inversion     Ankle eversion      (Blank rows = not tested)  LOWER EXTREMITY MMT:  MMT Right eval Left eval  Hip flexion  4  Hip extension    Hip abduction  3+  Hip adduction    Hip internal rotation    Hip external rotation    Knee flexion  3  Knee extension  3  Ankle dorsiflexion    Ankle plantarflexion    Ankle inversion    Ankle eversion     (Blank rows = not tested)  LOWER EXTREMITY SPECIAL TESTS:  S/P no special tests  completed  FUNCTIONAL TESTS:  5 times sit to stand: 32 sec  GAIT: Distance walked: household Assistive device utilized: Environmental Consultant - 2 wheeled Level of assistance: Complete Independence Comments: good form  TREATMENT DATE: L Knee 10/04/2024 TherEx:  Seated knee extension/flexion with reciprocal motion 1x15  Nustep level 4 for 8 minutes with bilat UE and LE  Slant board gastroc stretch 3x30s Bilat leg press 1x15 with 3-5s pauses in max flexion and extension with 75#  Standing hamstring stretch with 6 step  Step up and over with 6 (up with Rt, down with Rt) and bilat UE use on // bars 1x10  PT discussed fibular fracture, potential adaptations to exercises secondary to fracture, how pain levels are affected by fractures/recovery from surgery    09/29/24/ TherEx:  Nustep level 5 with bilat UE and LE for 8 minutes  Slant board gastroc stretch 3x30s  Supine quad sets with towel under ankle and slight PT overpressure 15x Supine straight leg raise 2x10  Mini Squats 2x10 Heel Raises 2x10 Prone extension stretch for 3 minutes  Manual:  Seated IR/distraction with knee flexion with PT overpressure into max flexion and PROM into knee extension   Vaso:  Lt LE elevated on wedge with medium compression for 10 minutes at 34 deg   09/26/2024 TherEx:  Nustep level 3 with bilat UE and LE for 8 minutes  Slant board gastroc stretch 3x30s  Calf raises with bilat UE support 2x10 Seated knee extension/flexion x10  Supine quad sets with towel under ankle and slight PT overpressure 1x10 with 3s holds  Supine straight leg raise 2x10  Supine heel slides 1x10   Manual:  Seated IR/distraction with knee flexion with PT overpressure into max flexion and PROM into knee extension   Vaso:  Lt LE elevated on wedge with medium compression for 10 minutes at 34 deg  09/22/24  Initial evaluation completed of L knee followed by instruction and trial set of HEP.    PATIENT EDUCATION:  Education details: HEP Person educated: Patient Education method: Programmer, Multimedia, Demonstration, Actor cues, and Verbal cues Education comprehension: verbalized understanding  HOME EXERCISE PROGRAM: Access Code: [email or web address] URL: [email or web address] Date: 09/22/2024 Prepared by: Burnard Meth  Exercises - Supine Quad Set  - 2 x daily - 7 x weekly - 2 sets - 10 reps - 3  - Supine Active Straight Leg Raise  - 2 x daily - 7 x weekly - 2 sets - 10 reps - Supine Heel Slides  - 2 x daily - 7 x weekly - 2 sets - 10 reps - Heel Raises with Counter Support  - 2 x daily - 7 x weekly - 2 sets - 10 reps - Mini Squat with Counter Support  - 2 x daily - 7 x weekly - 2 sets - 10 reps - Standing March with Counter Support  - 2 x daily - 7 x weekly - 2 sets - 10 reps  ASSESSMENT:  CLINICAL IMPRESSION: Patient arrived reporting fibular fracture in Lt LE with protocol to decrease distraction/pressure of Lt LE and no recumbent bike for 2 weeks, however, is still cleared for ambulating with SPC. Patient tolerated all activities this date with no increase in pain levels. Patient will continue to benefit from skilled PT.   OBJECTIVE IMPAIRMENTS: decreased balance, decreased endurance, difficulty walking, decreased ROM, decreased strength, hypomobility, increased edema, impaired flexibility, and pain.   ACTIVITY LIMITATIONS: bending, sitting, standing, squatting, stairs, transfers, bed mobility, bathing, and toileting  PARTICIPATION LIMITATIONS: meal prep, driving, and community activity  PERSONAL FACTORS: --are also affecting patient's functional outcome.   REHAB POTENTIAL: Good  CLINICAL DECISION MAKING: Stable/uncomplicated  EVALUATION COMPLEXITY: Moderate   GOALS: Goals reviewed with patient?  Yes  SHORT TERM GOALS: Target date: 10/20/2024 4 weeks    Pt to be independent  with HEP. Baseline: Goal status: INITIAL  2.  Decrease pain by 1 level. Baseline:  Goal status: INITIAL  3.  Pt to be able to ambulate short community distances with SPC. Baseline: RW Goal status: INITIAL  LONG TERM GOALS: Target date: 12/15/2024  12 weeks    Pt to be independent with self progressive HEP at discharge.   Baseline:  Goal status: INITIAL  2.  Decrease pain to max 2/10 with all activities. Baseline:  Goal status: INITIAL  3.  Increase AROM to 0> 105+L knee. Baseline: 84 degrees Goal status: INITIAL  4.  Increase strength to at least 4>4+/5 in L LE. Baseline: 3 Goal status: INITIAL  5.  Able to ambulate community distances without an AD. Baseline:  Goal status: INITIAL  6.  Increase score of PSFS to > 5. Baseline:  Goal status: INITIAL   PLAN:  PT FREQUENCY: 2x/week  PT DURATION: 10 weeks  PLANNED INTERVENTIONS: 97164- PT Re-evaluation, 97110-Therapeutic exercises, 97530- Therapeutic activity, 97112- Neuromuscular re-education, 97535- Self Care, [id number]- Manual therapy, [phone]- Gait training, [phone]- Aquatic Therapy, [phone]- Electrical stimulation (unattended), 97016- Vasopneumatic device, Patient/Family education, Balance training, Stair training, Joint mobilization, and Scar mobilization  PLAN FOR NEXT SESSION: LE strengthening, knee flexion/extension ROM, be aware of fibular fracture   Susannah Daring, PT, DPT 10/04/24 10:16 AM       Date of referral: 09/08/24 Referring provider: Jerona Sage MD Referring diagnosis?  M17.12 (ICD-10-CM) - Osteoarthritis of left knee, unspecified osteoarthritis type  Z96.652 (ICD-10-CM) - S/P total knee arthroplasty, left   Treatment diagnosis? (if different than referring diagnosis) M25.562  R60.0  R26.2  F37.18M73.10  What was this (referring dx) caused by? Arthritis  Nature of Condition: Chronic (continuous duration > 3 months)   Laterality: Lt  Current Functional Measure Score: Patient Specific Functional  Scale 0  Objective measurements identify impairments when they are compared to normal values, the uninvolved extremity, and prior level of function.  [x]  Yes  []  No  Objective assessment of functional ability: Severe functional limitations   Briefly describe symptoms: pain, decreased ROM, decreased strength  How did symptoms start: Pain in knee started in the spring  Average pain intensity:  Last 24 hours: 6/10  Past week: 9/10  How often does the pt experience symptoms? Constantly  How much have the symptoms interfered with usual daily activities? Extremely  How has condition changed since care began at this facility? NA - initial visit  In general, how is the patients overall health? Good   BACK PAIN (STarT Back Screening Tool) No

## 2024-10-04 ENCOUNTER — Other Ambulatory Visit (HOSPITAL_BASED_OUTPATIENT_CLINIC_OR_DEPARTMENT_OTHER): Payer: Self-pay | Admitting: Family

## 2024-10-04 ENCOUNTER — Ambulatory Visit (INDEPENDENT_AMBULATORY_CARE_PROVIDER_SITE_OTHER)

## 2024-10-04 DIAGNOSIS — R262 Difficulty in walking, not elsewhere classified: Secondary | ICD-10-CM | POA: Diagnosis not present

## 2024-10-04 DIAGNOSIS — M6281 Muscle weakness (generalized): Secondary | ICD-10-CM

## 2024-10-04 DIAGNOSIS — R2689 Other abnormalities of gait and mobility: Secondary | ICD-10-CM

## 2024-10-04 DIAGNOSIS — R600 Localized edema: Secondary | ICD-10-CM

## 2024-10-04 DIAGNOSIS — M25562 Pain in left knee: Secondary | ICD-10-CM

## 2024-10-07 ENCOUNTER — Telehealth: Payer: Self-pay | Admitting: *Deleted

## 2024-10-07 ENCOUNTER — Other Ambulatory Visit: Payer: Self-pay | Admitting: Physician Assistant

## 2024-10-07 DIAGNOSIS — Z96652 Presence of left artificial knee joint: Secondary | ICD-10-CM

## 2024-10-07 MED ORDER — HYDROCODONE-ACETAMINOPHEN 5-325 MG PO TABS: 1.0000 | ORAL_TABLET | ORAL | 0 refills | Status: DC | PRN

## 2024-10-07 NOTE — Telephone Encounter (Signed)
 Patient called requesting refill of her pain medication. She has gone through the pharmacy to request refill and I explained this previously and today that she has to call into the office to obtain this and we send it in. Narcotics can't be on automatic refill with her pharmacy. Also, we discussed the satisfaction survey that she completed and returned. She feels frustration in that she was having difficulty with pain the first few days after her surgery that continues and felt that she wasn't getting the response of medication refill or pain medication that she needed. She states in the hospital, she had to ask multiple times for pain medication when needed. I explained that we cannot account for that. Also she was concerned regarding a fibula fracture that showed on her 2 week X-ray in office that no one called her about until she noticed it on her mychart. Explained that this X-ray was taken in office and CM doesn't review X-rays with patients. This apparently was not reviewed with the patient due to unknown circumstances. Dr. Harden did however call and speak with patient on 10/03/24, informing that this was seen and that it required no surgical intervention or change in her weight bearing status. She of course is frustrated. RNCM did ask if there was any information that she needed differently other than the pre-op call approximately 1 hour in length as well as written information to go over her post op instructions with her since per the survey, she was dissatisfied with this. She stated, oh no, what you went over was fine. Sent office administrator an email to contact patient as requested to discuss further. Message sent to PA for pain medication refill request today.

## 2024-10-07 NOTE — Telephone Encounter (Signed)
 Patient called requesting a refill of her pain medication before the weekend. Could you send to her pharmacy? Thank you.

## 2024-10-11 ENCOUNTER — Ambulatory Visit: Admitting: Rehabilitative and Restorative Service Providers"

## 2024-10-11 ENCOUNTER — Encounter: Payer: Self-pay | Admitting: Rehabilitative and Restorative Service Providers"

## 2024-10-11 DIAGNOSIS — R2689 Other abnormalities of gait and mobility: Secondary | ICD-10-CM

## 2024-10-11 DIAGNOSIS — R600 Localized edema: Secondary | ICD-10-CM | POA: Diagnosis not present

## 2024-10-11 DIAGNOSIS — M25562 Pain in left knee: Secondary | ICD-10-CM | POA: Diagnosis not present

## 2024-10-11 DIAGNOSIS — M6281 Muscle weakness (generalized): Secondary | ICD-10-CM | POA: Diagnosis not present

## 2024-10-11 DIAGNOSIS — R262 Difficulty in walking, not elsewhere classified: Secondary | ICD-10-CM | POA: Diagnosis not present

## 2024-10-11 NOTE — Therapy (Signed)
 OUTPATIENT PHYSICAL THERAPY LOWER EXTREMITY TREATMENT   Patient Name: Madison Mosley MRN: [id number] DOB:[birth year], 73 y.o., female Today's Date: 10/11/2024  END OF SESSION:  PT End of Session - 10/11/24 1430     Visit Number 5    Number of Visits 25    Date for Recertification  12/15/24    Authorization Type [employer]    Progress Note Due on Visit 10    PT Start Time 1430    PT Stop Time 1520    PT Time Calculation (min) 50 min    Activity Tolerance Patient tolerated treatment well;No increased pain    Behavior During Therapy WFL for tasks assessed/performed           Past Medical History:  Diagnosis Date   Arthritis    CAD (coronary artery disease)    stents   GERD (gastroesophageal reflux disease)    Headache    Hyperlipidemia    Hypertension    Past Surgical History:  Procedure Laterality Date   CARDIAC CATHETERIZATION  11/18/1999   Percutaneous revascularization precedure with angioplasty to the proximal LAD and first diagonal    CESAREAN SECTION     CHOLECYSTECTOMY     CORONARY STENT PLACEMENT     KNEE ARTHROSCOPY WITH MEDIAL MENISECTOMY Left 05/06/2024   Procedure: LEFT KNEE ARTHROSCOPY WITH MEDIAL MENISCECTOMY;  Surgeon: Harden Jerona GAILS, MD;  Location: [facility] OR;  Service: Orthopedics;  Laterality: Left;   PARTIAL HYSTERECTOMY     TONSILLECTOMY  11/17/1958   TOTAL KNEE ARTHROPLASTY Left 09/07/2024   Procedure: ARTHROPLASTY, KNEE, TOTAL LEFT;  Surgeon: Harden Jerona GAILS, MD;  Location: [facility] OR;  Service: Orthopedics;  Laterality: Left;   Patient Active Problem List   Diagnosis Date Noted   Arthritis of left knee 09/07/2024   Osteoarthritis of left knee, unspecified osteoarthritis type 09/07/2024   Osteochondral defect of femoral condyle 05/06/2024   Right knee meniscal tear 10/02/2020   Unilateral primary osteoarthritis, right knee 04/25/2020   Low back pain 03/21/2020   Statin myopathy 11/16/2019   Atrophic vaginitis 06/18/2017   Sprain of  calcaneofibular ligament of left ankle 01/16/2017   Obesity (BMI 30.0-34.9) 01/05/2015   CAD S/P percutaneous coronary angioplasty 04/21/2013   Hypercholesterolemia 04/21/2013   Essential hypertension 04/21/2013    PCP: Clarice Nottingham, MD   REFERRING PROVIDER: Harden Jerona GAILS, MD   REFERRING DIAG:  [phone] (ICD-10-CM) - Osteoarthritis of left knee, unspecified osteoarthritis type  Z96.652 (ICD-10-CM) - S/P total knee arthroplasty, left    THERAPY DIAG:  Acute pain of left knee  Localized edema  Difficulty in walking, not elsewhere classified  Muscle weakness (generalized)  Other abnormalities of gait and mobility  Rationale for Evaluation and Treatment: Rehabilitation  ONSET DATE: 09/07/24 L TKA  SUBJECTIVE:   SUBJECTIVE STATEMENT: Madison Mosley reports sleep is about 3-4 hours before awaking to take pain meds.  Fibular fracture on the Left.  PERTINENT HISTORY: -- L knee pain started in April.  She had a meniscus surgery in June but pain continued.  Now s/p TKA.  No CPM.  Had 6 HHPT visits.  PAIN:  Are you having pain? Yes: NPRS scale: 5-7/10 this week Pain location: Lt all over Pain description: dull Aggravating factors: motion, bending, extending  Relieving factors: medication, ice  PRECAUTIONS: None  RED FLAGS: None   WEIGHT BEARING RESTRICTIONS: No  FALLS:  Has patient fallen in last 6 months? No  LIVING ENVIRONMENT: Lives with: lives with their spouse Lives in: House/apartment Stairs: Yes:  External: 2.5 steps; on right going up Has following equipment at home: Vannie - 2 wheeled  OCCUPATION: retired   PLOF: Independent  PATIENT GOALS: decrease pain   NEXT MD VISIT: not listed  OBJECTIVE:  Note: Objective measures were completed at Evaluation unless otherwise noted.  DIAGNOSTIC FINDINGS: MRI 04/12/24  IMPRESSION: Tricompartmental osteoarthrosis. Mild to moderate chondromalacia with moderate reactive joint effusion.   Moderate radial tear at  the root of the medial meniscus with medial displacement of body. There is second likely radial tear at the junction the body anterior horn of the medial meniscus. See above for more detail.  PATIENT SURVEYS:  PSFS: THE PATIENT SPECIFIC FUNCTIONAL SCALE  Place score of 0-10 (0 = unable to perform activity and 10 = able to perform activity at the same level as before injury or problem)  Activity Date: 09/22/24    2 Steps no railing 0    2.driving 0    3.Shower independently 0    4.      Total Score 0      Total Score = Sum of activity scores/number of activities  Minimally Detectable Change: 3 points (for single activity); 2 points (for average score)  [facility] (nd). The Patient Specific Functional Scale . Retrieved from [email or web address]   COGNITION: Overall cognitive status: Within functional limits for tasks assessed     SENSATION: WFL  EDEMA:  Circumferential: 55 cm  MUSCLE LENGTH: Hamstrings:  Left mod restriction    POSTURE: No Significant postural limitations  PALPATION: 2+ tenderness at anterior knee   LOWER EXTREMITY ROM:  Active/Passive ROM Right eval Left eval Left  09/29/24 Left/Right 10/11/2024  Hip flexion      Hip extension      Hip abduction      Hip adduction      Hip internal rotation      Hip external rotation      Knee flexion  80/84 90/92 105/124  Knee extension  +9/+5 +8AA 6/0  Ankle dorsiflexion      Ankle plantarflexion      Ankle inversion      Ankle eversion       (Blank rows = not tested)  LOWER EXTREMITY MMT:  MMT Right eval Left eval  Hip flexion  4  Hip extension    Hip abduction  3+  Hip adduction    Hip internal rotation    Hip external rotation    Knee flexion  3  Knee extension  3  Ankle dorsiflexion    Ankle plantarflexion    Ankle inversion    Ankle eversion     (Blank rows = not tested)  LOWER EXTREMITY SPECIAL TESTS:  S/P no special  tests completed  FUNCTIONAL TESTS:  5 times sit to stand: 32 sec  GAIT: Distance walked: household Assistive device utilized: Environmental Consultant - 2 wheeled Level of assistance: Complete Independence Comments: good form  TREATMENT DATE: L Knee 10/11/2024 Quad sets with left heel prop 2 sets of 10 for 5 seconds Seated knee flexion AAROM (right pushes left into flexion) 10 x 5 seconds with TKE between reps Seated straight leg raises 2 sets of 5 for 3 seconds  Functional Activities: Double leg Press 75# full extension to full flexion 15 x slow eccentrics Single leg Press 25# full extension to full flexion 10 x slow eccentrics  Vaso Medium 34* 10 minutes left Knee   10/04/2024 TherEx:  Seated knee extension/flexion with reciprocal motion 1x15  Nustep level 4 for 8 minutes with bilat UE and LE  Slant board gastroc stretch 3x30s Bilat leg press 1x15 with 3-5s pauses in max flexion and extension with 75#  Standing hamstring stretch with 6 step  Step up and over with 6 (up with Rt, down with Rt) and bilat UE use on // bars 1x10  PT discussed fibular fracture, potential adaptations to exercises secondary to fracture, how pain levels are affected by fractures/recovery from surgery    09/29/24 TherEx:  Nustep level 5 with bilat UE and LE for 8 minutes  Slant board gastroc stretch 3x30s  Supine quad sets with towel under ankle and slight PT overpressure 15x Supine straight leg raise 2x10  Mini Squats 2x10 Heel Raises 2x10 Prone extension stretch for 3 minutes  Manual:  Seated IR/distraction with knee flexion with PT overpressure into max flexion and PROM into knee extension   Vaso:  Lt LE elevated on wedge with medium compression for 10 minutes at 34 deg    PATIENT EDUCATION:  Education details: HEP Person educated: Patient Education method: Programmer, Multimedia,  Facilities Manager, Actor cues, and Verbal cues Education comprehension: verbalized understanding  HOME EXERCISE PROGRAM: Access Code: [email or web address] URL: [email or web address] Date: 10/11/2024 Prepared by: Lamar Ivory  Exercises - Supine Quad Set  - 2 x daily - 7 x weekly - 2 sets - 10 reps - 3 hold - Supine Active Straight Leg Raise  - 2 x daily - 7 x weekly - 2 sets - 10 reps - Supine Heel Slides  - 2 x daily - 7 x weekly - 2 sets - 10 reps - Heel Raises with Counter Support  - 2 x daily - 7 x weekly - 2 sets - 10 reps - Mini Squat with Counter Support  - 2 x daily - 7 x weekly - 2 sets - 10 reps - Standing March with Counter Support  - 2 x daily - 7 x weekly - 2 sets - 10 reps - Supine Quadricep Sets  - 5 x daily - 7 x weekly - 2 sets - 10 reps - 5 second hold - Seated Straight Leg Raise   - 2 x daily - 7 x weekly - 3-5 sets - 5 reps  ASSESSMENT:  CLINICAL IMPRESSION: Active range of motion (AROM) was 0 - 6 - 105 degrees today.  Edema is limiting extension AROM and quadriceps strength.  I encouraged Madison Mosley to get as close to 100 quad sets and 30-50 seated straight leg raises per day as she can to reduce edema, increase quadriceps strength and improve knee extension AROM.  OBJECTIVE IMPAIRMENTS: decreased balance, decreased endurance, difficulty walking, decreased ROM, decreased strength, hypomobility, increased edema, impaired flexibility, and pain.   ACTIVITY LIMITATIONS: bending, sitting, standing, squatting, stairs, transfers, bed mobility, bathing, and toileting  PARTICIPATION LIMITATIONS: meal prep, driving, and community activity  PERSONAL FACTORS: --are also affecting patient's functional outcome.   REHAB POTENTIAL: Good  CLINICAL  DECISION MAKING: Stable/uncomplicated  EVALUATION COMPLEXITY: Moderate   GOALS: Goals reviewed with patient? Yes  SHORT TERM GOALS: Target date: 10/20/2024 4 weeks    Pt to be independent with HEP. Baseline: Goal status: On  Going 10/11/2024  2.  Decrease pain by 1 level. Baseline:  Goal status: Met 10/11/2024  3.  Pt to be able to ambulate short community distances with SPC. Baseline: RW Goal status: Met 10/11/2024  LONG TERM GOALS: Target date: 12/15/2024  12 weeks    Pt to be independent with self progressive HEP at discharge.   Baseline:  Goal status: INITIAL  2.  Decrease pain to max 2/10 with all activities. Baseline:  Goal status: INITIAL  3.  Increase AROM to 0> 105+L knee. Baseline: 84 degrees Goal status: INITIAL  4.  Increase strength to at least 4>4+/5 in L LE. Baseline: 3 Goal status: INITIAL  5.  Able to ambulate community distances without an AD. Baseline:  Goal status: INITIAL  6.  Increase score of PSFS to > 5. Baseline:  Goal status: INITIAL   PLAN:  PT FREQUENCY: 2x/week  PT DURATION: 10 weeks  PLANNED INTERVENTIONS: 97164- PT Re-evaluation, 97110-Therapeutic exercises, 97530- Therapeutic activity, 97112- Neuromuscular re-education, 97535- Self Care, [id number]- Manual therapy, [phone]- Gait training, [phone]- Aquatic Therapy, [phone]- Electrical stimulation (unattended), 97016- Vasopneumatic device, Patient/Family education, Balance training, Stair training, Joint mobilization, and Scar mobilization  PLAN FOR NEXT SESSION: LE strengthening, knee flexion/extension ROM, edema control and be aware of her fibular fracture.  Myer LELON Ivory, PT, MPT 10/11/24 4:17 PM       Date of referral: 09/08/24 Referring provider: Jerona Sage MD Referring diagnosis?  M17.12 (ICD-10-CM) - Osteoarthritis of left knee, unspecified osteoarthritis type  Z96.652 (ICD-10-CM) - S/P total knee arthroplasty, left   Treatment diagnosis? (if different than referring diagnosis) M25.562  R60.0  R26.2  F37.18M73.10  What was this (referring dx) caused by? Arthritis  Nature of Condition: Chronic (continuous duration > 3 months)   Laterality: Lt  Current Functional Measure Score: Patient Specific  Functional Scale 0  Objective measurements identify impairments when they are compared to normal values, the uninvolved extremity, and prior level of function.  [x]  Yes  []  No  Objective assessment of functional ability: Severe functional limitations   Briefly describe symptoms: pain, decreased ROM, decreased strength  How did symptoms start: Pain in knee started in the spring  Average pain intensity:  Last 24 hours: 6/10  Past week: 9/10  How often does the pt experience symptoms? Constantly  How much have the symptoms interfered with usual daily activities? Extremely  How has condition changed since care began at this facility? NA - initial visit  In general, how is the patients overall health? Good   BACK PAIN (STarT Back Screening Tool) No

## 2024-10-12 NOTE — Telephone Encounter (Signed)
Patient aware this was sent.

## 2024-10-17 ENCOUNTER — Ambulatory Visit: Admitting: Rehabilitative and Restorative Service Providers"

## 2024-10-17 ENCOUNTER — Other Ambulatory Visit: Payer: Self-pay | Admitting: Orthopedic Surgery

## 2024-10-17 ENCOUNTER — Telehealth: Payer: Self-pay | Admitting: *Deleted

## 2024-10-17 ENCOUNTER — Encounter: Payer: Self-pay | Admitting: Rehabilitative and Restorative Service Providers"

## 2024-10-17 DIAGNOSIS — Z96652 Presence of left artificial knee joint: Secondary | ICD-10-CM

## 2024-10-17 DIAGNOSIS — R262 Difficulty in walking, not elsewhere classified: Secondary | ICD-10-CM | POA: Diagnosis not present

## 2024-10-17 DIAGNOSIS — R2689 Other abnormalities of gait and mobility: Secondary | ICD-10-CM

## 2024-10-17 DIAGNOSIS — M6281 Muscle weakness (generalized): Secondary | ICD-10-CM | POA: Diagnosis not present

## 2024-10-17 DIAGNOSIS — M25562 Pain in left knee: Secondary | ICD-10-CM

## 2024-10-17 DIAGNOSIS — R600 Localized edema: Secondary | ICD-10-CM

## 2024-10-17 MED ORDER — HYDROCODONE-ACETAMINOPHEN 5-325 MG PO TABS: 1.0000 | ORAL_TABLET | ORAL | 0 refills | Status: AC | PRN

## 2024-10-17 NOTE — Telephone Encounter (Signed)
 Just saw patient up in therapy today. She's improving. She did request refill of pain medication. Thank you.

## 2024-10-17 NOTE — Therapy (Signed)
 OUTPATIENT PHYSICAL THERAPY TREATMENT   Patient Name: Madison Mosley MRN: [id number] DOB:06-16-1951, 73 y.o., female Today's Date: 10/17/2024  END OF SESSION:  PT End of Session - 10/17/24 1316     Visit Number 6    Number of Visits 25    Date for Recertification  12/15/24    Authorization Type [employer] Medicare    Progress Note Due on Visit 10    PT Start Time 1300    PT Stop Time 1340    PT Time Calculation (min) 40 min    Activity Tolerance Patient limited by pain    Behavior During Therapy WFL for tasks assessed/performed            Past Medical History:  Diagnosis Date   Arthritis    CAD (coronary artery disease)    stents   GERD (gastroesophageal reflux disease)    Headache    Hyperlipidemia    Hypertension    Past Surgical History:  Procedure Laterality Date   CARDIAC CATHETERIZATION  11/18/1999   Percutaneous revascularization precedure with angioplasty to the proximal LAD and first diagonal    CESAREAN SECTION     CHOLECYSTECTOMY     CORONARY STENT PLACEMENT     KNEE ARTHROSCOPY WITH MEDIAL MENISECTOMY Left 05/06/2024   Procedure: LEFT KNEE ARTHROSCOPY WITH MEDIAL MENISCECTOMY;  Surgeon: Harden Jerona GAILS, MD;  Location: [facility] OR;  Service: Orthopedics;  Laterality: Left;   PARTIAL HYSTERECTOMY     TONSILLECTOMY  11/17/1958   TOTAL KNEE ARTHROPLASTY Left 09/07/2024   Procedure: ARTHROPLASTY, KNEE, TOTAL LEFT;  Surgeon: Harden Jerona GAILS, MD;  Location: [facility] OR;  Service: Orthopedics;  Laterality: Left;   Patient Active Problem List   Diagnosis Date Noted   Arthritis of left knee 09/07/2024   Osteoarthritis of left knee, unspecified osteoarthritis type 09/07/2024   Osteochondral defect of femoral condyle 05/06/2024   Right knee meniscal tear 10/02/2020   Unilateral primary osteoarthritis, right knee 04/25/2020   Low back pain 03/21/2020   Statin myopathy 11/16/2019   Atrophic vaginitis 06/18/2017   Sprain of calcaneofibular ligament of left ankle 01/16/2017    Obesity (BMI 30.0-34.9) 01/05/2015   CAD S/P percutaneous coronary angioplasty 04/21/2013   Hypercholesterolemia 04/21/2013   Essential hypertension 04/21/2013    PCP: Clarice Nottingham, MD   REFERRING PROVIDER: Harden Jerona GAILS, MD    REFERRING DIAG:  [phone] (ICD-10-CM) - Osteoarthritis of left knee, unspecified osteoarthritis type  Z96.652 (ICD-10-CM) - S/P total knee arthroplasty, left    THERAPY DIAG:  Acute pain of left knee  Localized edema  Difficulty in walking, not elsewhere classified  Muscle weakness (generalized)  Other abnormalities of gait and mobility  Rationale for Evaluation and Treatment: Rehabilitation  ONSET DATE: 09/07/24 L TKA  SUBJECTIVE:   SUBJECTIVE STATEMENT: Pt indicated having another hour between pain medicine than before.  Reported 6/10 upon arrival.  Pt indicated no updates from MD office.   PERTINENT HISTORY: Fracture noted on fibula.   L knee pain started in April.  She had a meniscus surgery in June but pain continued.  Now s/p TKA.  No CPM.  Had 6 HHPT visits.  PAIN:  NPRS scale: 5-7/10 this week Pain location: Lt all over Pain description: dull Aggravating factors: motion, bending, extending  Relieving factors: medication, ice  PRECAUTIONS: None  RED FLAGS: None   WEIGHT BEARING RESTRICTIONS: No  FALLS:  Has patient fallen in last 6 months? No  LIVING ENVIRONMENT: Lives with: lives with their spouse Lives in:  House/apartment Stairs: Yes: External: 2.5 steps; on right going up Has following equipment at home: Vannie - 2 wheeled  OCCUPATION: retired   PLOF: Independent  PATIENT GOALS: decrease pain   NEXT MD VISIT: not listed  OBJECTIVE:  Note: Objective measures were completed at Evaluation unless otherwise noted.  DIAGNOSTIC FINDINGS: MRI 04/12/24  IMPRESSION: Tricompartmental osteoarthrosis. Mild to moderate chondromalacia with moderate reactive joint effusion.   Moderate radial tear at the root of the  medial meniscus with medial displacement of body. There is second likely radial tear at the junction the body anterior horn of the medial meniscus. See above for more detail.  PATIENT SURVEYS:  PSFS: THE PATIENT SPECIFIC FUNCTIONAL SCALE  Place score of 0-10 (0 = unable to perform activity and 10 = able to perform activity at the same level as before injury or problem)  Activity Date: 09/22/24    2 Steps no railing 0    2.driving 0    3.Shower independently 0    4.      Total Score 0      Total Score = Sum of activity scores/number of activities  Minimally Detectable Change: 3 points (for single activity); 2 points (for average score)  [facility] (nd). The Patient Specific Functional Scale . Retrieved from [email or web address]   COGNITION: 09/22/2024 Overall cognitive status: Within functional limits for tasks assessed     SENSATION: 09/22/2024 [facility]  EDEMA:  09/22/2024 Circumferential: 55 cm  MUSCLE LENGTH: 09/22/2024 Hamstrings:  Left mod restriction    POSTURE: No Significant postural limitations  PALPATION: 09/22/2024 2+ tenderness at anterior knee   LOWER EXTREMITY ROM:  Active/Passive ROM Right eval Left Eval 09/22/2024 Left  09/29/24 Right 10/11/2024 Left 10/11/2024  Hip flexion       Hip extension       Hip abduction       Hip adduction       Hip internal rotation       Hip external rotation       Knee flexion  80/84 90/92 124 105  Knee extension  +9/+5 +8AA 0 -6  Ankle dorsiflexion       Ankle plantarflexion       Ankle inversion       Ankle eversion        (Blank rows = not tested)  LOWER EXTREMITY MMT:  MMT Right eval Left Eval 09/22/2024  Hip flexion  4  Hip extension    Hip abduction  3+  Hip adduction    Hip internal rotation    Hip external rotation    Knee flexion  3  Knee extension  3  Ankle dorsiflexion    Ankle plantarflexion    Ankle inversion    Ankle  eversion     (Blank rows = not tested)  LOWER EXTREMITY SPECIAL TESTS:  09/22/2024 S/P no special tests completed  FUNCTIONAL TESTS:  09/22/2024 5 times sit to stand: 32 sec  GAIT: 10/17/2024: Ambulation to clinic and in clinic with SPC in Rt UE. Reduced stance on Lt, lacking TKE.   09/22/2024 Distance walked: household Assistive device utilized: Environmental Consultant - 2 wheeled Level of assistance: Complete Independence Comments: good form  TREATMENT        DATE: 10/17/2024 Therex: Recumbent bike lvl 1 seat 8 , 8 mins. Partial revolution for 1 min or so at the start.  Incilne gastroc stretch 30 sec x5  Seated Lt leg LAQ 2-3 sec hold x 15   TherActivity: (improve transfers, ambulation, stairs) Leg press double leg 75 lbs x 15 slow lowering Leg press single leg 2 x 15 31 lbs , performed bilaterally    Neuro Re-ed (balance, coordination) Tandem stance on foam in // bars 1 min x 2 bilaterally with occasional HHA, SBA.  Time for instruction.    Vaso deferred for patient use of ice at home   TREATMENT        DATE:10/11/2024 Quad sets with left heel prop 2 sets of 10 for 5 seconds Seated knee flexion AAROM (right pushes left into flexion) 10 x 5 seconds with TKE between reps Seated straight leg raises 2 sets of 5 for 3 seconds  Functional Activities: Double leg Press 75# full extension to full flexion 15 x slow eccentrics Single leg Press 25# full extension to full flexion 10 x slow eccentrics  Vaso Medium 34* 10 minutes left Knee   10/04/2024 TherEx:  Seated knee extension/flexion with reciprocal motion 1x15  Nustep level 4 for 8 minutes with bilat UE and LE  Slant board gastroc stretch 3x30s Bilat leg press 1x15 with 3-5s pauses in max flexion and extension with 75#  Standing hamstring stretch with 6 step  Step up and over with 6 (up  with Rt, down with Rt) and bilat UE use on // bars 1x10  PT discussed fibular fracture, potential adaptations to exercises secondary to fracture, how pain levels are affected by fractures/recovery from surgery    09/29/24 TherEx:  Nustep level 5 with bilat UE and LE for 8 minutes  Slant board gastroc stretch 3x30s  Supine quad sets with towel under ankle and slight PT overpressure 15x Supine straight leg raise 2x10  Mini Squats 2x10 Heel Raises 2x10 Prone extension stretch for 3 minutes  Manual:  Seated IR/distraction with knee flexion with PT overpressure into max flexion and PROM into knee extension   Vaso:  Lt LE elevated on wedge with medium compression for 10 minutes at 34 deg    PATIENT EDUCATION:  Education details: HEP Person educated: Patient Education method: Programmer, Multimedia, Facilities Manager, Actor cues, and Verbal cues Education comprehension: verbalized understanding  HOME EXERCISE PROGRAM: Access Code: [email or web address] URL: [email or web address] Date: 10/11/2024 Prepared by: Lamar Ivory  Exercises - Supine Quad Set  - 2 x daily - 7 x weekly - 2 sets - 10 reps - 3 hold - Supine Active Straight Leg Raise  - 2 x daily - 7 x weekly - 2 sets - 10 reps - Supine Heel Slides  - 2 x daily - 7 x weekly - 2 sets - 10 reps - Heel Raises with Counter Support  - 2 x daily - 7 x weekly - 2 sets - 10 reps - Mini Squat with Counter Support  - 2 x daily - 7 x weekly - 2 sets - 10 reps - Standing March with Counter Support  - 2 x daily - 7 x weekly - 2 sets - 10 reps - Supine Quadricep Sets  - 5 x daily - 7 x weekly - 2 sets - 10 reps - 5 second hold - Seated Straight Leg Raise   - 2 x daily - 7 x weekly - 3-5 sets - 5 reps  ASSESSMENT:  CLINICAL IMPRESSION: Continued focus on graded WB return as able.  Pain still impactful for presentation and ambulation tolerance.  Continued strengthening and mobility gains to continue to help progress.  No specific pain worsening in  visit but limited activity to avoid worsening.   OBJECTIVE IMPAIRMENTS: decreased balance, decreased endurance, difficulty walking, decreased ROM, decreased strength, hypomobility, increased edema, impaired flexibility, and pain.   ACTIVITY LIMITATIONS: bending, sitting, standing, squatting, stairs, transfers, bed mobility, bathing, and toileting  PARTICIPATION LIMITATIONS: meal prep, driving, and community activity  PERSONAL FACTORS: --are also affecting patient's functional outcome.   REHAB POTENTIAL: Good  CLINICAL DECISION MAKING: Stable/uncomplicated  EVALUATION COMPLEXITY: Moderate   GOALS: Goals reviewed with patient? Yes  SHORT TERM GOALS: Target date: 10/20/2024 4 weeks    Pt to be independent with HEP. Baseline: Goal status: On Going 10/11/2024  2.  Decrease pain by 1 level. Baseline:  Goal status: Met 10/11/2024  3.  Pt to be able to ambulate short community distances with SPC. Baseline: RW Goal status: Met 10/11/2024  LONG TERM GOALS: Target date: 12/15/2024  12 weeks    Pt to be independent with self progressive HEP at discharge.   Baseline:  Goal status: INITIAL  2.  Decrease pain to max 2/10 with all activities. Baseline:  Goal status: INITIAL  3.  Increase AROM to 0> 105+L knee. Baseline: 84 degrees Goal status: INITIAL  4.  Increase strength to at least 4>4+/5 in L LE. Baseline: 3 Goal status: INITIAL  5.  Able to ambulate community distances without an AD. Baseline:  Goal status: INITIAL  6.  Increase score of PSFS to > 5. Baseline:  Goal status: INITIAL   PLAN:  PT FREQUENCY: 2x/week  PT DURATION: 10 weeks  PLANNED INTERVENTIONS: 97164- PT Re-evaluation, 97110-Therapeutic exercises, 97530- Therapeutic activity, 97112- Neuromuscular re-education, 97535- Self Care, [id number]- Manual therapy, [phone]- Gait training, [phone]- Aquatic Therapy, [phone]- Electrical stimulation (unattended), 97016- Vasopneumatic device, Patient/Family education,  Balance training, Stair training, Joint mobilization, and Scar mobilization  PLAN FOR NEXT SESSION: Strengthening as able,  be aware of her fibular fracture.   Check on [facility] medicare authorization Info.   Ozell Silvan, PT, DPT, OCS, ATC 10/17/24  1:44 PM     Date of referral: 09/08/24 Referring provider: Jerona Sage MD Referring diagnosis?  M17.12 (ICD-10-CM) - Osteoarthritis of left knee, unspecified osteoarthritis type  Z96.652 (ICD-10-CM) - S/P total knee arthroplasty, left   Treatment diagnosis? (if different than referring diagnosis) M25.562  R60.0  R26.2  F37.18M73.10  What was this (referring dx) caused by? Arthritis  Nature of Condition: Chronic (continuous duration > 3 months)   Laterality: Lt  Current Functional Measure Score: Patient Specific Functional Scale 0  Objective measurements identify impairments when they are compared to normal values, the uninvolved extremity, and prior level of function.  [x]  Yes  []  No  Objective assessment of functional ability: Severe functional limitations   Briefly describe symptoms: pain, decreased ROM, decreased strength  How did symptoms start: Pain in knee started in the spring  Average pain intensity:  Last 24 hours: 6/10  Past week: 9/10  How often does the pt experience symptoms? Constantly  How much have the symptoms interfered with usual daily activities? Extremely  How has condition changed since care began at this facility? NA - initial visit  In general, how is the patients overall health? Good   BACK PAIN (STarT Back Screening Tool) No

## 2024-10-18 ENCOUNTER — Telehealth: Payer: Self-pay

## 2024-10-18 NOTE — Telephone Encounter (Signed)
-----   Message from Jerona LULLA Sage sent at 10/17/2024  3:09 PM EST ----- Call patient.  I reviewed her her bone mineral density and she does have decreased bone mineral density of osteopenia but does not have significant decreased bone mineral density associated with osteoporosis.  Recommend weightbearing activities.  She does not need bone building medication at this time.  Recommend repeat routine bone mineral density study in 3 to 5 years. ----- Message ----- From: May, Wendy, RT Sent: 10/12/2024   1:40 PM EST To: Jerona Sage LULLA, MD  I obtained the DEXA on this patient.  Please review and advise, wanted to make sure referral was placed to [facility] if needed. ----- Message ----- From: Vinita Milling Sent: 10/11/2024  11:54 AM EST To: Sari Beal, RT

## 2024-10-18 NOTE — Telephone Encounter (Signed)
 I called and advised pt of message below. Verbalized understanding and will call with any questions. Confirmed her appt on 10/31/2024 will call if she needs anything prior.

## 2024-10-20 ENCOUNTER — Encounter: Payer: Self-pay | Admitting: Rehabilitative and Restorative Service Providers"

## 2024-10-20 ENCOUNTER — Ambulatory Visit: Admitting: Rehabilitative and Restorative Service Providers"

## 2024-10-20 DIAGNOSIS — M25562 Pain in left knee: Secondary | ICD-10-CM | POA: Diagnosis not present

## 2024-10-20 DIAGNOSIS — R600 Localized edema: Secondary | ICD-10-CM

## 2024-10-20 DIAGNOSIS — M6281 Muscle weakness (generalized): Secondary | ICD-10-CM | POA: Diagnosis not present

## 2024-10-20 DIAGNOSIS — R262 Difficulty in walking, not elsewhere classified: Secondary | ICD-10-CM | POA: Diagnosis not present

## 2024-10-20 DIAGNOSIS — R2689 Other abnormalities of gait and mobility: Secondary | ICD-10-CM

## 2024-10-20 NOTE — Therapy (Signed)
 OUTPATIENT PHYSICAL THERAPY TREATMENT   Patient Name: Madison Mosley MRN: [id number] DOB:1951-01-20, 73 y.o., female Today's Date: 10/20/2024  END OF SESSION:  PT End of Session - 10/20/24 1257     Visit Number 7    Number of Visits 25    Date for Recertification  12/15/24    Authorization Type [employer] Medicare    Authorization Time Period 09/22/2024 - 12/01/2024    Authorization - Visit Number 7    Authorization - Number of Visits 20    Progress Note Due on Visit 10    PT Start Time 1259    PT Stop Time 1339    PT Time Calculation (min) 40 min    Activity Tolerance Patient limited by pain    Behavior During Therapy [facility] for tasks assessed/performed             Past Medical History:  Diagnosis Date   Arthritis    CAD (coronary artery disease)    stents   GERD (gastroesophageal reflux disease)    Headache    Hyperlipidemia    Hypertension    Past Surgical History:  Procedure Laterality Date   CARDIAC CATHETERIZATION  11/18/1999   Percutaneous revascularization precedure with angioplasty to the proximal LAD and first diagonal    CESAREAN SECTION     CHOLECYSTECTOMY     CORONARY STENT PLACEMENT     KNEE ARTHROSCOPY WITH MEDIAL MENISECTOMY Left 05/06/2024   Procedure: LEFT KNEE ARTHROSCOPY WITH MEDIAL MENISCECTOMY;  Surgeon: Harden Jerona GAILS, MD;  Location: [facility] OR;  Service: Orthopedics;  Laterality: Left;   PARTIAL HYSTERECTOMY     TONSILLECTOMY  11/17/1958   TOTAL KNEE ARTHROPLASTY Left 09/07/2024   Procedure: ARTHROPLASTY, KNEE, TOTAL LEFT;  Surgeon: Harden Jerona GAILS, MD;  Location: [facility] OR;  Service: Orthopedics;  Laterality: Left;   Patient Active Problem List   Diagnosis Date Noted   Arthritis of left knee 09/07/2024   Osteoarthritis of left knee, unspecified osteoarthritis type 09/07/2024   Osteochondral defect of femoral condyle 05/06/2024   Right knee meniscal tear 10/02/2020   Unilateral primary osteoarthritis, right knee 04/25/2020   Low back pain 03/21/2020    Statin myopathy 11/16/2019   Atrophic vaginitis 06/18/2017   Sprain of calcaneofibular ligament of left ankle 01/16/2017   Obesity (BMI 30.0-34.9) 01/05/2015   CAD S/P percutaneous coronary angioplasty 04/21/2013   Hypercholesterolemia 04/21/2013   Essential hypertension 04/21/2013    PCP: Clarice Nottingham, MD   REFERRING PROVIDER: Harden Jerona GAILS, MD    REFERRING DIAG:  [phone] (ICD-10-CM) - Osteoarthritis of left knee, unspecified osteoarthritis type  Z96.652 (ICD-10-CM) - S/P total knee arthroplasty, left    THERAPY DIAG:  Acute pain of left knee  Localized edema  Difficulty in walking, not elsewhere classified  Muscle weakness (generalized)  Other abnormalities of gait and mobility  Rationale for Evaluation and Treatment: Rehabilitation  ONSET DATE: 09/07/24 L TKA  SUBJECTIVE:   SUBJECTIVE STATEMENT: Pt indicated having knee ache and complaints Tuesday morning upon waking and dealt with that all day.  Reported 5 hours between pain medicine.   PERTINENT HISTORY: Fracture noted on fibula.   L knee pain started in April.  She had a meniscus surgery in June but pain continued.  Now s/p TKA.  No CPM.  Had 6 HHPT visits.  PAIN:  NPRS scale: upon arrival 6/10.  Pain location: Lt all over Pain description: dull Aggravating factors: motion, bending, extending  Relieving factors: medication, ice  PRECAUTIONS: None  RED FLAGS:  None   WEIGHT BEARING RESTRICTIONS: No  FALLS:  Has patient fallen in last 6 months? No  LIVING ENVIRONMENT: Lives with: lives with their spouse Lives in: House/apartment Stairs: Yes: External: 2.5 steps; on right going up Has following equipment at home: Vannie - 2 wheeled  OCCUPATION: retired   PLOF: Independent  PATIENT GOALS: decrease pain   NEXT MD VISIT: not listed  OBJECTIVE:  Note: Objective measures were completed at Evaluation unless otherwise noted.  DIAGNOSTIC FINDINGS: MRI 04/12/24  IMPRESSION: Tricompartmental  osteoarthrosis. Mild to moderate chondromalacia with moderate reactive joint effusion.   Moderate radial tear at the root of the medial meniscus with medial displacement of body. There is second likely radial tear at the junction the body anterior horn of the medial meniscus. See above for more detail.  PATIENT SURVEYS:  PSFS: THE PATIENT SPECIFIC FUNCTIONAL SCALE  Place score of 0-10 (0 = unable to perform activity and 10 = able to perform activity at the same level as before injury or problem)  Activity Date: 09/22/24 10/20/2024   2 Steps no railing 0 0   2.driving 0 5   3.Shower independently 0 8   4.      Total Score 0 4.3 avg     Total Score = Sum of activity scores/number of activities  Minimally Detectable Change: 3 points (for single activity); 2 points (for average score)  [facility] (nd). The Patient Specific Functional Scale . Retrieved from [email or web address].[email or web address]   COGNITION: 09/22/2024 Overall cognitive status: Within functional limits for tasks assessed     SENSATION: 09/22/2024 [facility]  EDEMA:  09/22/2024 Circumferential: 55 cm  MUSCLE LENGTH: 09/22/2024 Hamstrings:  Left mod restriction    POSTURE: No Significant postural limitations  PALPATION: 09/22/2024 2+ tenderness at anterior knee   LOWER EXTREMITY ROM:  Active/Passive ROM Right eval Left Eval 09/22/2024 Left  09/29/24 Right 10/11/2024 Left 10/11/2024  Hip flexion       Hip extension       Hip abduction       Hip adduction       Hip internal rotation       Hip external rotation       Knee flexion  80/84 90/92 124 105  Knee extension  +9/+5 +8AA 0 -6  Ankle dorsiflexion       Ankle plantarflexion       Ankle inversion       Ankle eversion        (Blank rows = not tested)  LOWER EXTREMITY MMT:  MMT Right eval Left Eval 09/22/2024  Hip flexion  4  Hip extension    Hip abduction  3+  Hip adduction    Hip internal  rotation    Hip external rotation    Knee flexion  3  Knee extension  3  Ankle dorsiflexion    Ankle plantarflexion    Ankle inversion    Ankle eversion     (Blank rows = not tested)  LOWER EXTREMITY SPECIAL TESTS:  09/22/2024 S/P no special tests completed  FUNCTIONAL TESTS:  09/22/2024 5 times sit to stand: 32 sec  GAIT: 10/17/2024: Ambulation to clinic and in clinic with SPC in Rt UE. Reduced stance on Lt, lacking TKE.   09/22/2024 Distance walked: household Assistive device utilized: Environmental Consultant - 2 wheeled Level of assistance: Complete Independence Comments: good form                   TREATMENT  DATE: 10/20/2024    Therex: Recumbent bike lvl 1 seat 8 , 10 mins. Partial revolution to full rev at times Incline gastroc stretch 30 sec x 5 bilaterally  Seated Lt  leg LAQ with end range pauses each direction with contralateral leg movement opposite x 10, 2 x 10 3 lb weight Seated Lt extension/flexion isometric alternating 5 sec hold each x 12  Supine heel slide with quad set combo 5 sec hold x 10 each Lt leg  Manual Seated Lt knee flexion with passive stretch, contract/relax for knee flexion gains.     TREATMENT        DATE: 10/17/2024 Therex: Recumbent bike lvl 1 seat 8 , 8 mins. Partial revolution for 1 min or so at the start.  Incilne gastroc stretch 30 sec x5  Seated Lt leg LAQ 2-3 sec hold x 15   TherActivity: (improve transfers, ambulation, stairs) Leg press double leg 75 lbs x 15 slow lowering Leg press single leg 2 x 15 31 lbs , performed bilaterally    Neuro Re-ed (balance, coordination) Tandem stance on foam in // bars 1 min x 2 bilaterally with occasional HHA, SBA.  Time for instruction.    Vaso deferred for patient use of ice at home   TREATMENT        DATE:10/11/2024 Quad sets with left heel prop 2 sets of 10 for 5 seconds Seated knee flexion AAROM (right pushes left into flexion) 10 x 5 seconds with TKE between reps Seated straight leg raises 2  sets of 5 for 3 seconds  Functional Activities: Double leg Press 75# full extension to full flexion 15 x slow eccentrics Single leg Press 25# full extension to full flexion 10 x slow eccentrics  Vaso Medium 34* 10 minutes left Knee   10/04/2024 TherEx:  Seated knee extension/flexion with reciprocal motion 1x15  Nustep level 4 for 8 minutes with bilat UE and LE  Slant board gastroc stretch 3x30s Bilat leg press 1x15 with 3-5s pauses in max flexion and extension with 75#  Standing hamstring stretch with 6 step  Step up and over with 6 (up with Rt, down with Rt) and bilat UE use on // bars 1x10  PT discussed fibular fracture, potential adaptations to exercises secondary to fracture, how pain levels are affected by fractures/recovery from surgery    PATIENT EDUCATION:  Education details: HEP Person educated: Patient Education method: Explanation, Demonstration, Tactile cues, and Verbal cues Education comprehension: verbalized understanding  HOME EXERCISE PROGRAM: Access Code: [email or web address] URL: [email or web address] Date: 10/11/2024 Prepared by: Lamar Ivory  Exercises - Supine Quad Set  - 2 x daily - 7 x weekly - 2 sets - 10 reps - 3 hold - Supine Active Straight Leg Raise  - 2 x daily - 7 x weekly - 2 sets - 10 reps - Supine Heel Slides  - 2 x daily - 7 x weekly - 2 sets - 10 reps - Heel Raises with Counter Support  - 2 x daily - 7 x weekly - 2 sets - 10 reps - Mini Squat with Counter Support  - 2 x daily - 7 x weekly - 2 sets - 10 reps - Standing March with Counter Support  - 2 x daily - 7 x weekly - 2 sets - 10 reps - Supine Quadricep Sets  - 5 x daily - 7 x weekly - 2 sets - 10 reps - 5 second hold - Seated Straight Leg Raise   - 2 x daily -  7 x weekly - 3-5 sets - 5 reps  ASSESSMENT:  CLINICAL IMPRESSION: Limited WB activity due to symptoms indicated.  Focused on mobility gains and swelling reduction as able.    OBJECTIVE IMPAIRMENTS: decreased balance,  decreased endurance, difficulty walking, decreased ROM, decreased strength, hypomobility, increased edema, impaired flexibility, and pain.   ACTIVITY LIMITATIONS: bending, sitting, standing, squatting, stairs, transfers, bed mobility, bathing, and toileting  PARTICIPATION LIMITATIONS: meal prep, driving, and community activity  PERSONAL FACTORS: --are also affecting patient's functional outcome.   REHAB POTENTIAL: Good  CLINICAL DECISION MAKING: Stable/uncomplicated  EVALUATION COMPLEXITY: Moderate   GOALS: Goals reviewed with patient? Yes  SHORT TERM GOALS: Target date: 10/20/2024 4 weeks    Pt to be independent with HEP. Baseline: Goal status: Met 10/20/2024  2.  Decrease pain by 1 level. Baseline:  Goal status: Met 10/11/2024  3.  Pt to be able to ambulate short community distances with SPC. Baseline: RW Goal status: Met 10/11/2024  LONG TERM GOALS: Target date: 12/15/2024  12 weeks    Pt to be independent with self progressive HEP at discharge.   Baseline:  Goal status: on going 10/20/2024  2.  Decrease pain to max 2/10 with all activities. Baseline:  Goal status: on going 10/20/2024  3.  Increase AROM to 0> 105+L knee. Baseline: 84 degrees Goal status: on going 10/20/2024  4.  Increase strength to at least 4>4+/5 in L LE. Baseline: 3 Goal status: on going 10/20/2024  5.  Able to ambulate community distances without an AD. Baseline:  Goal status: on going 10/20/2024  6.  Increase score of PSFS to > 5. Baseline:  Goal status: on going 10/20/2024   PLAN:  PT FREQUENCY: 2x/week  PT DURATION: 10 weeks  PLANNED INTERVENTIONS: 97164- PT Re-evaluation, 97110-Therapeutic exercises, 97530- Therapeutic activity, 97112- Neuromuscular re-education, 97535- Self Care, 02859- Manual therapy, [phone]- Gait training, [phone]- Aquatic Therapy, [phone]- Electrical stimulation (unattended), [phone]- Vasopneumatic device, Patient/Family education, Balance training, Stair training,  Joint mobilization, and Scar mobilization  PLAN FOR NEXT SESSION: Strengthening as able, Continued mobility gains.    be aware of her fibular fracture.    Ozell Silvan, PT, DPT, OCS, ATC 10/20/24  1:40 PM     Date of referral: 09/08/24 Referring provider: Jerona Sage MD Referring diagnosis?  M17.12 (ICD-10-CM) - Osteoarthritis of left knee, unspecified osteoarthritis type  Z96.652 (ICD-10-CM) - S/P total knee arthroplasty, left   Treatment diagnosis? (if different than referring diagnosis) M25.562  R60.0  R26.2  F37.18M73.10  What was this (referring dx) caused by? Arthritis  Nature of Condition: Chronic (continuous duration > 3 months)   Laterality: Lt  Current Functional Measure Score: Patient Specific Functional Scale 0  Objective measurements identify impairments when they are compared to normal values, the uninvolved extremity, and prior level of function.  [x]  Yes  []  No  Objective assessment of functional ability: Severe functional limitations   Briefly describe symptoms: pain, decreased ROM, decreased strength  How did symptoms start: Pain in knee started in the spring  Average pain intensity:  Last 24 hours: 6/10  Past week: 9/10  How often does the pt experience symptoms? Constantly  How much have the symptoms interfered with usual daily activities? Extremely  How has condition changed since care began at this facility? NA - initial visit  In general, how is the patients overall health? Good   BACK PAIN (STarT Back Screening Tool) No

## 2024-10-21 ENCOUNTER — Encounter: Payer: Self-pay | Admitting: Cardiovascular Disease

## 2024-10-21 ENCOUNTER — Ambulatory Visit: Attending: Cardiovascular Disease | Admitting: Cardiovascular Disease

## 2024-10-21 VITALS — BP 138/60 | HR 97 | Ht 68.0 in | Wt 222.0 lb

## 2024-10-21 DIAGNOSIS — R6889 Other general symptoms and signs: Secondary | ICD-10-CM | POA: Diagnosis not present

## 2024-10-21 DIAGNOSIS — I2510 Atherosclerotic heart disease of native coronary artery without angina pectoris: Secondary | ICD-10-CM | POA: Diagnosis not present

## 2024-10-21 DIAGNOSIS — I10 Essential (primary) hypertension: Secondary | ICD-10-CM | POA: Diagnosis not present

## 2024-10-21 NOTE — Progress Notes (Unsigned)
 Cardiology Office Note    Date:  10/23/2024   ID:  JERMYA DOWDING, DOB August 17, 1951, MRN [id number]  PCP:  Clarice Nottingham, MD  Cardiologist:   Madison Balding, MD   Chief Complaint  Patient presents with   Coronary Artery Disease    History of Present Illness:  Madison Mosley is a 73 y.o. female with early onset coronary artery disease (3 separate stent procedures 2001-2002) without need for revascularization in the last more than 20 years. Her last functional study was a normal nuclear stress test in February 2019.    She has had a lot of problems with pain in her left leg after she underwent left meniscus surgery in the fibular fracture.  She underwent a left total knee replacement 6 weeks ago and is slowly recovering (not as fast as she had hoped).  The patient specifically denies any chest pain at rest or with exertion, dyspnea at rest or with exertion, orthopnea, paroxysmal nocturnal dyspnea, syncope, palpitations, focal neurological deficits, intermittent claudication, lower extremity edema, unexplained weight gain, cough, hemoptysis or wheezing.   Her metabolic profile remains very unfavorable.  Total cholesterol is 297, LDL 195, HDL 74 and triglycerides 154.  She does not have diabetes mellitus.  Her hemoglobin A1c was 5.4%.  She has normal renal function with a creatinine of 0.92.  She is minimally anemic with a hemoglobin of 10.1, after her knee surgery.  She has significant hyperlipidemia but unfortunately has been intolerant to most statins, Zetia and WelChol.  She is currently on pravastatin 20 mg daily and was unable to tolerate higher doses due to allergy.  She was enrolled in a clinical trial of an oral cholesterol-lowering drug but developed a rash.  She has never taken PCSK9 inh.   Past Medical History:  Diagnosis Date   Arthritis    CAD (coronary artery disease)    stents   GERD (gastroesophageal reflux disease)    Headache    Hyperlipidemia    Hypertension      Past Surgical History:  Procedure Laterality Date   CARDIAC CATHETERIZATION  11/18/1999   Percutaneous revascularization precedure with angioplasty to the proximal LAD and first diagonal    CESAREAN SECTION     CHOLECYSTECTOMY     CORONARY STENT PLACEMENT     KNEE ARTHROSCOPY WITH MEDIAL MENISECTOMY Left 05/06/2024   Procedure: LEFT KNEE ARTHROSCOPY WITH MEDIAL MENISCECTOMY;  Surgeon: Harden Jerona GAILS, MD;  Location: [facility] OR;  Service: Orthopedics;  Laterality: Left;   PARTIAL HYSTERECTOMY     TONSILLECTOMY  11/17/1958   TOTAL KNEE ARTHROPLASTY Left 09/07/2024   Procedure: ARTHROPLASTY, KNEE, TOTAL LEFT;  Surgeon: Harden Jerona GAILS, MD;  Location: [facility] OR;  Service: Orthopedics;  Laterality: Left;    Current Medications: Outpatient Medications Prior to Visit  Medication Sig Dispense Refill   Ascorbic Acid  (VITAMIN C  PO) Take 500 mg by mouth 2 (two) times a week.     aspirin  EC 81 MG tablet Take 1 tablet (81 mg total) by mouth daily. 90 tablet 3   ELDERBERRY PO Take 1 capsule by mouth daily.     HYDROcodone -acetaminophen  (NORCO/VICODIN) 5-325 MG tablet Take 1 tablet by mouth every 4 (four) hours as needed for moderate pain (pain score 4-6). 42 tablet 0   Magnesium  250 MG TABS Take 250 mg by mouth 4 (four) times a week.     metoprolol  succinate (TOPROL -XL) 50 MG 24 hr tablet Take 25 mg by mouth daily.  Multiple Vitamin (MULTIVITAMIN WITH MINERALS) TABS Take 1 tablet by mouth 4 (four) times a week.     Probiotic Product (PROBIOTIC PO) Take 1 capsule by mouth daily.     QUERCETIN PO Take 1 tablet by mouth daily. With bromelain     SPIRULINA PO Take 1 tablet by mouth daily.     triamterene -hydrochlorothiazide  (MAXZIDE -25) 37.5-25 MG per tablet Take 1 tablet by mouth daily.     zinc  gluconate 50 MG tablet Take 50 mg by mouth 2 (two) times a week.     ondansetron  (ZOFRAN ) 4 MG tablet Take 1 tablet (4 mg total) by mouth every 8 (eight) hours as needed for nausea or vomiting. 20 tablet 0    triamcinolone  cream (KENALOG) 0.1 % Apply 1 Application topically as needed (itch).     No facility-administered medications prior to visit.     Allergies:   Crestor  [rosuvastatin calcium], Ezetimibe, Pitavastatin, Porcine (pork) protein-containing drug products, Welchol  [colesevelam hcl], Zocor  [simvastatin], and Naproxen   Family History:  The patient's family history includes CAD (age of onset: 36) in her brother; COPD in her mother; Diabetes in her mother; Heart attack in her maternal grandfather and paternal grandfather; Heart attack (age of onset: 22) in her sister; Heart attack (age of onset: 36) in her father; Heart failure in her mother; Stroke in her paternal grandfather; Valvular heart disease in her mother.   ROS:   Please see the history of present illness.    ROS All other systems are reviewed and are negative.   PHYSICAL EXAM:   VS:  BP 138/60 (BP Location: Left Arm, Patient Position: Sitting, Cuff Size: Large)   Pulse 97   Ht 5' 8 (1.727 m)   Wt 222 lb (100.7 kg) Comment: Patient refused to weigh today and told be to use previous weight  SpO2 98%   BMI 33.75 kg/m       General: Alert, oriented x3, no distress, moderately obese Head: no evidence of trauma, PERRL, EOMI, no exophtalmos or lid lag, no myxedema, no xanthelasma; normal ears, nose and oropharynx Neck: normal jugular venous pulsations and no hepatojugular reflux; brisk carotid pulses without delay and no carotid bruits Chest: clear to auscultation, no signs of consolidation by percussion or palpation, normal fremitus, symmetrical and full respiratory excursions Cardiovascular: normal position and quality of the apical impulse, regular rhythm, normal first and second heart sounds, no murmurs, rubs or gallops Abdomen: no tenderness or distention, no masses by palpation, no abnormal pulsatility or arterial bruits, normal bowel sounds, no hepatosplenomegaly Extremities: no clubbing, cyanosis or edema; 2+  radial, ulnar and brachial pulses bilaterally; 2+ right femoral, posterior tibial and dorsalis pedis pulses; 2+ left femoral, posterior tibial and dorsalis pedis pulses; no subclavian or femoral bruits Neurological: grossly nonfocal Psych: Normal mood and affect    Wt Readings from Last 3 Encounters:  10/21/24 222 lb (100.7 kg)  09/07/24 220 lb (99.8 kg)  09/05/24 222 lb 3.2 oz (100.8 kg)      Studies/Labs Reviewed:   EKG: Personally reviewed her most recent ECG from 09/05/2024 which shows sinus rhythm with a single PVC and diffuse nonspecific ST changes  EKG Interpretation Date/Time:    Ventricular Rate:    PR Interval:    QRS Duration:    QT Interval:    QTC Calculation:   R Axis:      Text Interpretation:           Recent Labs:  Lipid Panel  04/28/2024 Cholesterol 297, HDL 74, LDL 195, triglyceride 154 Hemoglobin A1c 5.4%  09/08/2024 Hemoglobin 10.1, creatinine 0.92, potassium 3.5  ASSESSMENT:    1. Coronary artery disease involving native coronary artery of native heart without angina pectoris   2. Essential hypertension   3. Suspected heterozygous familial hypercholesterolemia       PLAN:  In order of problems listed above:  CAD: Asymptomatic.  Less physically active than before because of her knee problems, but nevertheless not sedentary.  It's been almost 25 years since her revascularization procedures.  On aspirin  and beta-blockers.  Does not want to take any medicines for her cholesterol. HTN: Adequate control HLP: Her lipid profile is strongly suggestive of familial heterozygous hypercholesterolemia.  Very elevated LDL cholesterol puts her at high risk for future cardiovascular complications.  I once again offered treatment with a PCSK9 inhibitor Mosley as Repatha or Praluent or twice yearly shots of Leqvio, but she declined.  After I insisted she promised me she would think about it, but I think this means no.   She has declined referral to the  lipid clinic.    Medication Adjustments/Labs and Tests Ordered: Current medicines are reviewed at length with the patient today.  Concerns regarding medicines are outlined above.  Medication changes, Labs and Tests ordered today are listed in the Patient Instructions below. Patient Instructions  Medication Instructions:  Medications to research: Repatha, Praluent, or Leqvio *If you need a refill on your cardiac medications before your next appointment, please call your pharmacy*  Lab Work: None ordered If you have labs (blood work) drawn today and your tests are completely normal, you will receive your results only by: MyChart Message (if you have MyChart) OR A paper copy in the mail If you have any lab test that is abnormal or we need to change your treatment, we will call you to review the results.  Testing/Procedures: None ordered  Follow-Up: At [facility], you and your health needs are our priority.  As part of our continuing mission to provide you with exceptional heart care, our providers are all part of one team.  This team includes your primary Cardiologist (physician) and Advanced Practice Providers or APPs (Physician Assistants and Nurse Practitioners) who all work together to provide you with the care you need, when you need it.  Your next appointment:   1 year(s)  Provider:   Jerel Balding, MD    We recommend signing up for the patient portal called MyChart.  Sign up information is provided on this After Visit Summary.  MyChart is used to connect with patients for Virtual Visits (Telemedicine).  Patients are able to view lab/test results, encounter notes, upcoming appointments, etc.  Non-urgent messages can be sent to your provider as well.   To learn more about what you can do with MyChart, go to [email or web address].      Signed, Madison Balding, MD  10/23/2024 1:51 PM    [facility] [address] [place], [place], [place]   [address] Phone: [phone]; Fax: [phone]

## 2024-10-21 NOTE — Patient Instructions (Signed)
 Medication Instructions:  Medications to research: Repatha, Praluent, or Leqvio *If you need a refill on your cardiac medications before your next appointment, please call your pharmacy*  Lab Work: None ordered If you have labs (blood work) drawn today and your tests are completely normal, you will receive your results only by: MyChart Message (if you have MyChart) OR A paper copy in the mail If you have any lab test that is abnormal or we need to change your treatment, we will call you to review the results.  Testing/Procedures: None ordered  Follow-Up: At [facility], you and your health needs are our priority.  As part of our continuing mission to provide you with exceptional heart care, our providers are all part of one team.  This team includes your primary Cardiologist (physician) and Advanced Practice Providers or APPs (Physician Assistants and Nurse Practitioners) who all work together to provide you with the care you need, when you need it.  Your next appointment:   1 year(s)  Provider:   Jerel Balding, MD    We recommend signing up for the patient portal called MyChart.  Sign up information is provided on this After Visit Summary.  MyChart is used to connect with patients for Virtual Visits (Telemedicine).  Patients are able to view lab/test results, encounter notes, upcoming appointments, etc.  Non-urgent messages can be sent to your provider as well.   To learn more about what you can do with MyChart, go to [email or web address].

## 2024-10-23 ENCOUNTER — Encounter: Payer: Self-pay | Admitting: Cardiovascular Disease

## 2024-10-24 ENCOUNTER — Encounter: Payer: Self-pay | Admitting: Rehabilitative and Restorative Service Providers"

## 2024-10-24 ENCOUNTER — Ambulatory Visit: Admitting: Rehabilitative and Restorative Service Providers"

## 2024-10-24 DIAGNOSIS — M6281 Muscle weakness (generalized): Secondary | ICD-10-CM

## 2024-10-24 DIAGNOSIS — R600 Localized edema: Secondary | ICD-10-CM

## 2024-10-24 DIAGNOSIS — R2689 Other abnormalities of gait and mobility: Secondary | ICD-10-CM

## 2024-10-24 DIAGNOSIS — R262 Difficulty in walking, not elsewhere classified: Secondary | ICD-10-CM

## 2024-10-24 DIAGNOSIS — M25562 Pain in left knee: Secondary | ICD-10-CM

## 2024-10-24 NOTE — Therapy (Signed)
 OUTPATIENT PHYSICAL THERAPY TREATMENT   Patient Name: Madison Mosley MRN: [id number] DOB:1950-12-30, 73 y.o., female Today's Date: 10/24/2024  END OF SESSION:  PT End of Session - 10/24/24 1301     Visit Number 8    Number of Visits 25    Date for Recertification  12/15/24    Authorization Type [employer] Medicare    Authorization Time Period 09/22/2024 - 12/01/2024    Authorization - Visit Number 8    Authorization - Number of Visits 20    Progress Note Due on Visit 10    PT Start Time 1258    PT Stop Time 1337    PT Time Calculation (min) 39 min    Activity Tolerance Patient limited by pain    Behavior During Therapy [facility] for tasks assessed/performed              Past Medical History:  Diagnosis Date   Arthritis    CAD (coronary artery disease)    stents   GERD (gastroesophageal reflux disease)    Headache    Hyperlipidemia    Hypertension    Past Surgical History:  Procedure Laterality Date   CARDIAC CATHETERIZATION  11/18/1999   Percutaneous revascularization precedure with angioplasty to the proximal LAD and first diagonal    CESAREAN SECTION     CHOLECYSTECTOMY     CORONARY STENT PLACEMENT     KNEE ARTHROSCOPY WITH MEDIAL MENISECTOMY Left 05/06/2024   Procedure: LEFT KNEE ARTHROSCOPY WITH MEDIAL MENISCECTOMY;  Surgeon: Harden Jerona GAILS, MD;  Location: [facility] OR;  Service: Orthopedics;  Laterality: Left;   PARTIAL HYSTERECTOMY     TONSILLECTOMY  11/17/1958   TOTAL KNEE ARTHROPLASTY Left 09/07/2024   Procedure: ARTHROPLASTY, KNEE, TOTAL LEFT;  Surgeon: Harden Jerona GAILS, MD;  Location: [facility] OR;  Service: Orthopedics;  Laterality: Left;   Patient Active Problem List   Diagnosis Date Noted   Arthritis of left knee 09/07/2024   Osteoarthritis of left knee, unspecified osteoarthritis type 09/07/2024   Osteochondral defect of femoral condyle 05/06/2024   Right knee meniscal tear 10/02/2020   Unilateral primary osteoarthritis, right knee 04/25/2020   Low back pain 03/21/2020    Statin myopathy 11/16/2019   Atrophic vaginitis 06/18/2017   Sprain of calcaneofibular ligament of left ankle 01/16/2017   Obesity (BMI 30.0-34.9) 01/05/2015   CAD S/P percutaneous coronary angioplasty 04/21/2013   Hypercholesterolemia 04/21/2013   Essential hypertension 04/21/2013    PCP: Clarice Nottingham, MD   REFERRING PROVIDER: Harden Jerona GAILS, MD    REFERRING DIAG:  [phone] (ICD-10-CM) - Osteoarthritis of left knee, unspecified osteoarthritis type  Z96.652 (ICD-10-CM) - S/P total knee arthroplasty, left    THERAPY DIAG:  Acute pain of left knee  Localized edema  Difficulty in walking, not elsewhere classified  Muscle weakness (generalized)  Other abnormalities of gait and mobility  Rationale for Evaluation and Treatment: Rehabilitation  ONSET DATE: 09/07/24 L TKA  SUBJECTIVE:   SUBJECTIVE STATEMENT: Pt indicated driving this weekend for the first attempt. Reported several other driving.   Reported feeling cold weather has hit her last few days and feeling stiff/pain.   Reported using stairs with one rail on Lt going down with reciprocal gait pattern.   PERTINENT HISTORY: Fracture noted on fibula.   L knee pain started in April.  She had a meniscus surgery in June but pain continued.  Now s/p TKA.  No CPM.  Had 6 HHPT visits.  PAIN:  NPRS scale: upon arrival 6/10.  Pain location: Lt  all over Pain description: dull Aggravating factors: motion, bending, extending  Relieving factors: medication, ice  PRECAUTIONS: None  RED FLAGS: None   WEIGHT BEARING RESTRICTIONS: No  FALLS:  Has patient fallen in last 6 months? No  LIVING ENVIRONMENT: Lives with: lives with their spouse Lives in: House/apartment Stairs: Yes: External: 2.5 steps; on right going up Has following equipment at home: Vannie - 2 wheeled  OCCUPATION: retired   PLOF: Independent  PATIENT GOALS: decrease pain   NEXT MD VISIT: not listed  OBJECTIVE:  Note: Objective measures were  completed at Evaluation unless otherwise noted.  DIAGNOSTIC FINDINGS: MRI 04/12/24  IMPRESSION: Tricompartmental osteoarthrosis. Mild to moderate chondromalacia with moderate reactive joint effusion.   Moderate radial tear at the root of the medial meniscus with medial displacement of body. There is second likely radial tear at the junction the body anterior horn of the medial meniscus. See above for more detail.  PATIENT SURVEYS:  PSFS: THE PATIENT SPECIFIC FUNCTIONAL SCALE  Place score of 0-10 (0 = unable to perform activity and 10 = able to perform activity at the same level as before injury or problem)  Activity Date: 09/22/24 10/20/2024   2 Steps no railing 0 0   2.driving 0 5   3.Shower independently 0 8   4.      Total Score 0 4.3 avg     Total Score = Sum of activity scores/number of activities  Minimally Detectable Change: 3 points (for single activity); 2 points (for average score)  [facility] (nd). The Patient Specific Functional Scale . Retrieved from [email or web address]   COGNITION: 09/22/2024 Overall cognitive status: Within functional limits for tasks assessed     SENSATION: 09/22/2024 [facility]  EDEMA:  09/22/2024 Circumferential: 55 cm  MUSCLE LENGTH: 09/22/2024 Hamstrings:  Left mod restriction    POSTURE: No Significant postural limitations  PALPATION: 09/22/2024 2+ tenderness at anterior knee   LOWER EXTREMITY ROM:  Active/Passive ROM Right eval Left Eval 09/22/2024 Left  09/29/24 Right 10/11/2024 Left 10/11/2024  Hip flexion       Hip extension       Hip abduction       Hip adduction       Hip internal rotation       Hip external rotation       Knee flexion  80/84 90/92 124 105  Knee extension  +9/+5 +8AA 0 -6  Ankle dorsiflexion       Ankle plantarflexion       Ankle inversion       Ankle eversion        (Blank rows = not tested)  LOWER EXTREMITY MMT:  MMT Right eval  Left Eval 09/22/2024  Hip flexion  4  Hip extension    Hip abduction  3+  Hip adduction    Hip internal rotation    Hip external rotation    Knee flexion  3  Knee extension  3  Ankle dorsiflexion    Ankle plantarflexion    Ankle inversion    Ankle eversion     (Blank rows = not tested)  LOWER EXTREMITY SPECIAL TESTS:  09/22/2024 S/P no special tests completed  FUNCTIONAL TESTS:  09/22/2024 5 times sit to stand: 32 sec  GAIT: 10/24/2024: Ambulation with SPC in Rt UE, antalgic gait noted with trunk lean away from Lt with reduced stance, TKE, toe press off with Lt leg.   10/17/2024: Ambulation to clinic and in clinic with SPC in Rt UE. Reduced  stance on Lt, lacking TKE.   09/22/2024 Distance walked: household Assistive device utilized: Environmental Consultant - 2 wheeled Level of assistance: Complete Independence Comments: good form                   TREATMENT        DATE: 10/24/2024    Therex: Nustep lvl 5 UE/LE 10 mins UE/LE for ROM.    Neuro Re-ed Tandem ambulation in // bars 10 ft x 4 with occasional HHA Tandem stance on ground in // bars 1 min x 2 bilaterally with occasional HHA  Retro step weight shifting x 10 bilaterally   TherActivity: (improve transfers, ambulation, stairs) Leg press double leg 75 lbs x 15 slow lowering Leg press single leg 2 x 15 31 lbs , performed bilaterally    TREATMENT        DATE: 10/20/2024    Therex: Recumbent bike lvl 1 seat 8 , 10 mins. Partial revolution to full rev at times Incline gastroc stretch 30 sec x 5 bilaterally  Seated Lt  leg LAQ with end range pauses each direction with contralateral leg movement opposite x 10, 2 x 10 3 lb weight Seated Lt extension/flexion isometric alternating 5 sec hold each x 12  Supine heel slide with quad set combo 5 sec hold x 10 each Lt leg  Manual Seated Lt knee flexion with passive stretch, contract/relax for knee flexion gains.     TREATMENT        DATE: 10/17/2024 Therex: Recumbent bike lvl 1 seat 8  , 8 mins. Partial revolution for 1 min or so at the start.  Incilne gastroc stretch 30 sec x5  Seated Lt leg LAQ 2-3 sec hold x 15   TherActivity: (improve transfers, ambulation, stairs) Leg press double leg 75 lbs x 15 slow lowering Leg press single leg 2 x 15 31 lbs , performed bilaterally    Neuro Re-ed (balance, coordination) Tandem stance on foam in // bars 1 min x 2 bilaterally with occasional HHA, SBA.  Time for instruction.    Vaso deferred for patient use of ice at home   TREATMENT        DATE:10/11/2024 Quad sets with left heel prop 2 sets of 10 for 5 seconds Seated knee flexion AAROM (right pushes left into flexion) 10 x 5 seconds with TKE between reps Seated straight leg raises 2 sets of 5 for 3 seconds  Functional Activities: Double leg Press 75# full extension to full flexion 15 x slow eccentrics Single leg Press 25# full extension to full flexion 10 x slow eccentrics  Vaso Medium 34* 10 minutes left Knee   TREATMENT        DATE11/18/2025 TherEx:  Seated knee extension/flexion with reciprocal motion 1x15  Nustep level 4 for 8 minutes with bilat UE and LE  Slant board gastroc stretch 3x30s Bilat leg press 1x15 with 3-5s pauses in max flexion and extension with 75#  Standing hamstring stretch with 6 step  Step up and over with 6 (up with Rt, down with Rt) and bilat UE use on // bars 1x10  PT discussed fibular fracture, potential adaptations to exercises secondary to fracture, how pain levels are affected by fractures/recovery from surgery    PATIENT EDUCATION:  Education details: HEP Person educated: Patient Education method: Explanation, Demonstration, Tactile cues, and Verbal cues Education comprehension: verbalized understanding  HOME EXERCISE PROGRAM: Access Code: [email or web address] URL: [email or web address] Date: 10/11/2024 Prepared by: Lamar Ivory  Exercises - Supine Quad Set  -  2 x daily - 7 x weekly - 2 sets - 10 reps - 3 hold -  Supine Active Straight Leg Raise  - 2 x daily - 7 x weekly - 2 sets - 10 reps - Supine Heel Slides  - 2 x daily - 7 x weekly - 2 sets - 10 reps - Heel Raises with Counter Support  - 2 x daily - 7 x weekly - 2 sets - 10 reps - Mini Squat with Counter Support  - 2 x daily - 7 x weekly - 2 sets - 10 reps - Standing March with Counter Support  - 2 x daily - 7 x weekly - 2 sets - 10 reps - Supine Quadricep Sets  - 5 x daily - 7 x weekly - 2 sets - 10 reps - 5 second hold - Seated Straight Leg Raise   - 2 x daily - 7 x weekly - 3-5 sets - 5 reps  ASSESSMENT:  CLINICAL IMPRESSION: Resumed some standing, WB activity for balance and ambulation control improvements with good overall tolerance.  Antalgic gait in ambulation still noted with SPC use.  Pt asked about whole leg compression machine.  Advised her to check with MD regarding use of whole leg compression machine with fibula fracture status  OBJECTIVE IMPAIRMENTS: decreased balance, decreased endurance, difficulty walking, decreased ROM, decreased strength, hypomobility, increased edema, impaired flexibility, and pain.   ACTIVITY LIMITATIONS: bending, sitting, standing, squatting, stairs, transfers, bed mobility, bathing, and toileting  PARTICIPATION LIMITATIONS: meal prep, driving, and community activity  PERSONAL FACTORS: --are also affecting patient's functional outcome.   REHAB POTENTIAL: Good  CLINICAL DECISION MAKING: Stable/uncomplicated  EVALUATION COMPLEXITY: Moderate   GOALS: Goals reviewed with patient? Yes  SHORT TERM GOALS: Target date: 10/20/2024 4 weeks    Pt to be independent with HEP. Baseline: Goal status: Met 10/20/2024  2.  Decrease pain by 1 level. Baseline:  Goal status: Met 10/11/2024  3.  Pt to be able to ambulate short community distances with SPC. Baseline: RW Goal status: Met 10/11/2024  LONG TERM GOALS: Target date: 12/15/2024  12 weeks    Pt to be independent with self progressive HEP at  discharge.   Baseline:  Goal status: on going 10/20/2024  2.  Decrease pain to max 2/10 with all activities. Baseline:  Goal status: on going 10/20/2024  3.  Increase AROM to 0> 105+L knee. Baseline: 84 degrees Goal status: on going 10/20/2024  4.  Increase strength to at least 4>4+/5 in L LE. Baseline: 3 Goal status: on going 10/20/2024  5.  Able to ambulate community distances without an AD. Baseline:  Goal status: on going 10/20/2024  6.  Increase score of PSFS to > 5. Baseline:  Goal status: on going 10/20/2024   PLAN:  PT FREQUENCY: 2x/week  PT DURATION: 10 weeks  PLANNED INTERVENTIONS: 97164- PT Re-evaluation, 97110-Therapeutic exercises, 97530- Therapeutic activity, 97112- Neuromuscular re-education, 97535- Self Care, 02859- Manual therapy, [phone]- Gait training, [phone]- Aquatic Therapy, [phone]- Electrical stimulation (unattended), 97016- Vasopneumatic device, Patient/Family education, Balance training, Stair training, Joint mobilization, and Scar mobilization  PLAN FOR NEXT SESSION: Strengthening , balance ability.  ROM check/strength update.    be aware of her fibular fracture.    Ozell Silvan, PT, DPT, OCS, ATC 10/24/24  1:42 PM     Date of referral: 09/08/24 Referring provider: Jerona Sage MD Referring diagnosis?  M17.12 (ICD-10-CM) - Osteoarthritis of left knee, unspecified osteoarthritis type  Z96.652 (ICD-10-CM) - S/P total knee arthroplasty, left  Treatment diagnosis? (if different than referring diagnosis) M25.562  R60.0  R26.2  [phone]  What was this (referring dx) caused by? Arthritis  Nature of Condition: Chronic (continuous duration > 3 months)   Laterality: Lt  Current Functional Measure Score: Patient Specific Functional Scale 0  Objective measurements identify impairments when they are compared to normal values, the uninvolved extremity, and prior level of function.  [x]  Yes  []  No  Objective assessment of functional ability:  Severe functional limitations   Briefly describe symptoms: pain, decreased ROM, decreased strength  How did symptoms start: Pain in knee started in the spring  Average pain intensity:  Last 24 hours: 6/10  Past week: 9/10  How often does the pt experience symptoms? Constantly  How much have the symptoms interfered with usual daily activities? Extremely  How has condition changed since care began at this facility? NA - initial visit  In general, how is the patients overall health? Good   BACK PAIN (STarT Back Screening Tool) No

## 2024-10-26 ENCOUNTER — Encounter: Payer: Self-pay | Admitting: Rehabilitative and Restorative Service Providers"

## 2024-10-26 ENCOUNTER — Ambulatory Visit: Admitting: Rehabilitative and Restorative Service Providers"

## 2024-10-26 DIAGNOSIS — R2689 Other abnormalities of gait and mobility: Secondary | ICD-10-CM

## 2024-10-26 DIAGNOSIS — R262 Difficulty in walking, not elsewhere classified: Secondary | ICD-10-CM

## 2024-10-26 DIAGNOSIS — R600 Localized edema: Secondary | ICD-10-CM | POA: Diagnosis not present

## 2024-10-26 DIAGNOSIS — M25562 Pain in left knee: Secondary | ICD-10-CM

## 2024-10-26 DIAGNOSIS — M6281 Muscle weakness (generalized): Secondary | ICD-10-CM | POA: Diagnosis not present

## 2024-10-26 NOTE — Therapy (Signed)
 OUTPATIENT PHYSICAL THERAPY TREATMENT   Patient Name: Madison Mosley MRN: [id number] DOB:1951/10/13, 73 y.o., female Today's Date: 10/26/2024  END OF SESSION:  PT End of Session - 10/26/24 1300     Visit Number 9    Number of Visits 25    Date for Recertification  12/15/24    Authorization Type [employer] Medicare    Authorization Time Period 09/22/2024 - 12/01/2024    Authorization - Number of Visits 20    Progress Note Due on Visit 10    PT Start Time 1300    PT Stop Time 1353    PT Time Calculation (min) 53 min    Activity Tolerance Patient limited by pain;Patient tolerated treatment well;No increased pain    Behavior During Therapy WFL for tasks assessed/performed               Past Medical History:  Diagnosis Date   Arthritis    CAD (coronary artery disease)    stents   GERD (gastroesophageal reflux disease)    Headache    Hyperlipidemia    Hypertension    Past Surgical History:  Procedure Laterality Date   CARDIAC CATHETERIZATION  11/18/1999   Percutaneous revascularization precedure with angioplasty to the proximal LAD and first diagonal    CESAREAN SECTION     CHOLECYSTECTOMY     CORONARY STENT PLACEMENT     KNEE ARTHROSCOPY WITH MEDIAL MENISECTOMY Left 05/06/2024   Procedure: LEFT KNEE ARTHROSCOPY WITH MEDIAL MENISCECTOMY;  Surgeon: Harden Jerona GAILS, MD;  Location: [facility] OR;  Service: Orthopedics;  Laterality: Left;   PARTIAL HYSTERECTOMY     TONSILLECTOMY  11/17/1958   TOTAL KNEE ARTHROPLASTY Left 09/07/2024   Procedure: ARTHROPLASTY, KNEE, TOTAL LEFT;  Surgeon: Harden Jerona GAILS, MD;  Location: [facility] OR;  Service: Orthopedics;  Laterality: Left;   Patient Active Problem List   Diagnosis Date Noted   Arthritis of left knee 09/07/2024   Osteoarthritis of left knee, unspecified osteoarthritis type 09/07/2024   Osteochondral defect of femoral condyle 05/06/2024   Right knee meniscal tear 10/02/2020   Unilateral primary osteoarthritis, right knee 04/25/2020   Low  back pain 03/21/2020   Statin myopathy 11/16/2019   Atrophic vaginitis 06/18/2017   Sprain of calcaneofibular ligament of left ankle 01/16/2017   Obesity (BMI 30.0-34.9) 01/05/2015   CAD S/P percutaneous coronary angioplasty 04/21/2013   Hypercholesterolemia 04/21/2013   Essential hypertension 04/21/2013    PCP: Clarice Nottingham, MD   REFERRING PROVIDER: Harden Jerona GAILS, MD    REFERRING DIAG:  [phone] (ICD-10-CM) - Osteoarthritis of left knee, unspecified osteoarthritis type  Z96.652 (ICD-10-CM) - S/P total knee arthroplasty, left    THERAPY DIAG:  Acute pain of left knee  Localized edema  Difficulty in walking, not elsewhere classified  Muscle weakness (generalized)  Other abnormalities of gait and mobility  Rationale for Evaluation and Treatment: Rehabilitation  ONSET DATE: 09/07/24 L TKA  SUBJECTIVE:   SUBJECTIVE STATEMENT: Madison Mosley notes she is down to every 6 hours with her pain meds.  Sleep is 60% of normal.  PERTINENT HISTORY: Fracture noted on fibula.   L knee pain started in April.  She had a meniscus surgery in June but pain continued.  Now s/p TKA.  No CPM.  Had 6 HHPT visits.  PAIN:  NPRS scale: 3-5/10 this week Pain location: Lt all over Pain description: dull Aggravating factors: motion, bending, extending  Relieving factors: medication, ice  PRECAUTIONS: None  RED FLAGS: None   WEIGHT BEARING RESTRICTIONS: No  FALLS:  Has patient fallen in last 6 months? No  LIVING ENVIRONMENT: Lives with: lives with their spouse Lives in: House/apartment Stairs: Yes: External: 2.5 steps; on right going up Has following equipment at home: Vannie - 2 wheeled  OCCUPATION: retired   PLOF: Independent  PATIENT GOALS: decrease pain   NEXT MD VISIT: not listed  OBJECTIVE:  Note: Objective measures were completed at Evaluation unless otherwise noted.  DIAGNOSTIC FINDINGS: MRI 04/12/24  IMPRESSION: Tricompartmental osteoarthrosis. Mild to moderate  chondromalacia with moderate reactive joint effusion.   Moderate radial tear at the root of the medial meniscus with medial displacement of body. There is second likely radial tear at the junction the body anterior horn of the medial meniscus. See above for more detail.  PATIENT SURVEYS:  PSFS: THE PATIENT SPECIFIC FUNCTIONAL SCALE  Place score of 0-10 (0 = unable to perform activity and 10 = able to perform activity at the same level as before injury or problem)  Activity Date: 09/22/24 10/20/2024   2 Steps no railing 0 0   2.driving 0 5   3.Shower independently 0 8   4.      Total Score 0 4.3 avg     Total Score = Sum of activity scores/number of activities  Minimally Detectable Change: 3 points (for single activity); 2 points (for average score)  [facility] (nd). The Patient Specific Functional Scale . Retrieved from [email or web address]   COGNITION: 09/22/2024 Overall cognitive status: Within functional limits for tasks assessed     SENSATION: 09/22/2024 [facility]  EDEMA:  09/22/2024 Circumferential: 55 cm  MUSCLE LENGTH: 09/22/2024 Hamstrings:  Left mod restriction    POSTURE: No Significant postural limitations  PALPATION: 09/22/2024 2+ tenderness at anterior knee   LOWER EXTREMITY ROM:  Active/Passive ROM Right eval Left Eval 09/22/2024 Left  09/29/24 Right 10/11/2024 Left 10/11/2024 Left 10/26/2024  Hip flexion        Hip extension        Hip abduction        Hip adduction        Hip internal rotation        Hip external rotation        Knee flexion  80/84 90/92 124 105 102  Knee extension  +9/+5 +8AA 0 -6 -5  Ankle dorsiflexion        Ankle plantarflexion        Ankle inversion        Ankle eversion         (Blank rows = not tested)  LOWER EXTREMITY MMT:  MMT Right eval Left Eval 09/22/2024  Hip flexion  4  Hip extension    Hip abduction  3+  Hip adduction    Hip internal  rotation    Hip external rotation    Knee flexion  3  Knee extension  3  Ankle dorsiflexion    Ankle plantarflexion    Ankle inversion    Ankle eversion     (Blank rows = not tested)  LOWER EXTREMITY SPECIAL TESTS:  09/22/2024 S/P no special tests completed  FUNCTIONAL TESTS:  09/22/2024 5 times sit to stand: 32 sec  GAIT: 10/24/2024: Ambulation with SPC in Rt UE, antalgic gait noted with trunk lean away from Lt with reduced stance, TKE, toe press off with Lt leg.   10/17/2024: Ambulation to clinic and in clinic with SPC in Rt UE. Reduced stance on Lt, lacking TKE.   09/22/2024 Distance walked: household Assistive device utilized: Environmental Consultant -  2 wheeled Level of assistance: Complete Independence Comments: good form                    TREATMENT        DATE: 10/26/2024   Recumbent bike Seat 9 for 5 minutes Level 3 Seated straight leg raises 3 sets of 5, emphasis on pausing 2 seconds  before the lift and slow eccentrics with no quadriceps lag Seated knee flexion active-assisted range of motion 10 x 10 seconds Quad sets with left heel prop 10 x 5 seconds  Functional Activities: Double Leg Press 75# for 15 x slow eccentrics full extension and stretch into flexion full extension and stretch into flexion Single Leg Press 31# for 10 x slow eccentrics   Neuromuscular re-education: Wide to more narrow tandem balance 10 x 10 seconds  Vaso Medium Pressure 34* 10 minutes 34* Left knee   TREATMENT        DATE: 10/24/2024    Therex: Nustep lvl 5 UE/LE 10 mins UE/LE for ROM.    Neuro Re-ed Tandem ambulation in // bars 10 ft x 4 with occasional HHA Tandem stance on ground in // bars 1 min x 2 bilaterally with occasional HHA  Retro step weight shifting x 10 bilaterally   TherActivity: (improve transfers, ambulation, stairs) Leg press double leg 75 lbs x 15 slow lowering Leg press single leg 2 x 15 31 lbs , performed bilaterally    TREATMENT        DATE: 10/20/2024     Therex: Recumbent bike lvl 1 seat 8 , 10 mins. Partial revolution to full rev at times Incline gastroc stretch 30 sec x 5 bilaterally  Seated Lt  leg LAQ with end range pauses each direction with contralateral leg movement opposite x 10, 2 x 10 3 lb weight Seated Lt extension/flexion isometric alternating 5 sec hold each x 12  Supine heel slide with quad set combo 5 sec hold x 10 each Lt leg  Manual Seated Lt knee flexion with passive stretch, contract/relax for knee flexion gains.    PATIENT EDUCATION:  Education details: HEP Person educated: Patient Education method: Programmer, Multimedia, Demonstration, Actor cues, and Verbal cues Education comprehension: verbalized understanding  HOME EXERCISE PROGRAM: Access Code: [email or web address] URL: [email or web address] Date: 10/11/2024 Prepared by: Lamar Ivory  Exercises - Supine Quad Set  - 2 x daily - 7 x weekly - 2 sets - 10 reps - 3 hold - Supine Active Straight Leg Raise  - 2 x daily - 7 x weekly - 2 sets - 10 reps - Supine Heel Slides  - 2 x daily - 7 x weekly - 2 sets - 10 reps - Heel Raises with Counter Support  - 2 x daily - 7 x weekly - 2 sets - 10 reps - Mini Squat with Counter Support  - 2 x daily - 7 x weekly - 2 sets - 10 reps - Standing March with Counter Support  - 2 x daily - 7 x weekly - 2 sets - 10 reps - Supine Quadricep Sets  - 5 x daily - 7 x weekly - 2 sets - 10 reps - 5 second hold - Seated Straight Leg Raise   - 2 x daily - 7 x weekly - 3-5 sets - 5 reps  ASSESSMENT:  CLINICAL IMPRESSION: Active range of motion, edema control and quadriceps strength remain high priorities with [facility] programs.  We also spent time on balance and functional activities to meet  all long-term goals.  OBJECTIVE IMPAIRMENTS: decreased balance, decreased endurance, difficulty walking, decreased ROM, decreased strength, hypomobility, increased edema, impaired flexibility, and pain.   ACTIVITY LIMITATIONS: bending,  sitting, standing, squatting, stairs, transfers, bed mobility, bathing, and toileting  PARTICIPATION LIMITATIONS: meal prep, driving, and community activity  PERSONAL FACTORS: --are also affecting patient's functional outcome.   REHAB POTENTIAL: Good  CLINICAL DECISION MAKING: Stable/uncomplicated  EVALUATION COMPLEXITY: Moderate   GOALS: Goals reviewed with patient? Yes  SHORT TERM GOALS: Target date: 10/20/2024 4 weeks    Pt to be independent with HEP. Baseline: Goal status: Met 10/20/2024  2.  Decrease pain by 1 level. Baseline:  Goal status: Met 10/11/2024  3.  Pt to be able to ambulate short community distances with SPC. Baseline: RW Goal status: Met 10/11/2024  LONG TERM GOALS: Target date: 12/15/2024  12 weeks    Pt to be independent with self progressive HEP at discharge.   Baseline:  Goal status: on going 10/26/2024  2.  Decrease pain to max 2/10 with all activities. Baseline:  Goal status: on going 10/26/2024  3.  Increase AROM to 0> 105+L knee. Baseline: 84 degrees Goal status: on going 10/26/2024  4.  Increase strength to at least 4>4+/5 in L LE. Baseline: 3 Goal status: on going 10/26/2024  5.  Able to ambulate community distances without an AD. Baseline:  Goal status: on going 10/26/2024  6.  Increase score of PSFS to > 5. Baseline:  Goal status: on going 10/20/2024   PLAN:  PT FREQUENCY: 2x/week  PT DURATION: 10 weeks  PLANNED INTERVENTIONS: 97164- PT Re-evaluation, 97110-Therapeutic exercises, 97530- Therapeutic activity, 97112- Neuromuscular re-education, 97535- Self Care, 02859- Manual therapy, [phone]- Gait training, [phone]- Aquatic Therapy, [phone]- Electrical stimulation (unattended), [phone]- Vasopneumatic device, Patient/Family education, Balance training, Stair training, Joint mobilization, and Scar mobilization  PLAN FOR NEXT SESSION: Strengthening (quadriceps emphasis), balance, range of motion and edema control.  Be aware of her  fibular fracture.   Myer LELON Ivory PT, MPT 10/26/24  1:50 PM     Date of referral: 09/08/24 Referring provider: Jerona Sage MD Referring diagnosis?  M17.12 (ICD-10-CM) - Osteoarthritis of left knee, unspecified osteoarthritis type  Z96.652 (ICD-10-CM) - S/P total knee arthroplasty, left   Treatment diagnosis? (if different than referring diagnosis) M25.562  R60.0  R26.2  F37.18M73.10  What was this (referring dx) caused by? Arthritis  Nature of Condition: Chronic (continuous duration > 3 months)   Laterality: Lt  Current Functional Measure Score: Patient Specific Functional Scale 0  Objective measurements identify impairments when they are compared to normal values, the uninvolved extremity, and prior level of function.  [x]  Yes  []  No  Objective assessment of functional ability: Severe functional limitations   Briefly describe symptoms: pain, decreased ROM, decreased strength  How did symptoms start: Pain in knee started in the spring  Average pain intensity:  Last 24 hours: 6/10  Past week: 9/10  How often does the pt experience symptoms? Constantly  How much have the symptoms interfered with usual daily activities? Extremely  How has condition changed since care began at this facility? NA - initial visit  In general, how is the patients overall health? Good   BACK PAIN (STarT Back Screening Tool) No

## 2024-10-27 ENCOUNTER — Telehealth: Payer: Self-pay | Admitting: *Deleted

## 2024-10-27 ENCOUNTER — Other Ambulatory Visit: Payer: Self-pay | Admitting: Orthopedic Surgery

## 2024-10-27 DIAGNOSIS — Z96652 Presence of left artificial knee joint: Secondary | ICD-10-CM

## 2024-10-27 MED ORDER — HYDROCODONE-ACETAMINOPHEN 5-325 MG PO TABS: 1.0000 | ORAL_TABLET | Freq: Four times a day (QID) | ORAL | 0 refills | Status: AC | PRN

## 2024-10-27 NOTE — Telephone Encounter (Signed)
 Patient would like refill of pain medication. She has been weaning herself. Still working hard in therapy.

## 2024-10-28 NOTE — Telephone Encounter (Signed)
 Patient aware

## 2024-10-31 ENCOUNTER — Ambulatory Visit: Admitting: Orthopedic Surgery

## 2024-10-31 ENCOUNTER — Encounter: Payer: Self-pay | Admitting: Rehabilitative and Restorative Service Providers"

## 2024-10-31 ENCOUNTER — Encounter: Payer: Self-pay | Admitting: Orthopedic Surgery

## 2024-10-31 ENCOUNTER — Ambulatory Visit: Admitting: Rehabilitative and Restorative Service Providers"

## 2024-10-31 DIAGNOSIS — Z96652 Presence of left artificial knee joint: Secondary | ICD-10-CM

## 2024-10-31 DIAGNOSIS — M6281 Muscle weakness (generalized): Secondary | ICD-10-CM

## 2024-10-31 DIAGNOSIS — R262 Difficulty in walking, not elsewhere classified: Secondary | ICD-10-CM

## 2024-10-31 DIAGNOSIS — R600 Localized edema: Secondary | ICD-10-CM

## 2024-10-31 DIAGNOSIS — R2689 Other abnormalities of gait and mobility: Secondary | ICD-10-CM

## 2024-10-31 DIAGNOSIS — M25562 Pain in left knee: Secondary | ICD-10-CM

## 2024-10-31 NOTE — Progress Notes (Signed)
 Office Visit Note   Patient: Madison Mosley           Date of Birth: [birth year]           MRN: [id number] Visit Date: 10/31/2024              Requested by: Clarice Nottingham, MD [address] [place],  [place] [address] PCP: Clarice Nottingham, MD  Chief Complaint  Patient presents with   Left Knee - Routine Post Op    09/07/2024 left knee replacement       HPI: Discussed the use of AI scribe software for clinical note transcription with the patient, who gave verbal consent to proceed.  History of Present Illness Patient is seen in follow-up 2 months status post left total knee arthroplasty. She is undergoing rehabilitation following a fibular neck fracture and reports improvement, actively working on stretching her knee to achieve full extension. She participates in therapy and plans to continue exercises at a gym after completing her therapy sessions. She is also working on walking longer distances, aiming for a mile and a half.  She inquires about the healing process of her fracture and plans to have an x-ray at her next visit to assess healing progress. She mentions that her bone density was checked and recounts a discussion about the surgical procedure involving metal caps on the bone.  She discovered a leftover absorbable suture that had worked its way out, causing minor bleeding for a minute or two.  She discusses the use of compression socks and boots, noting that she wears compression socks but has to remove them after four or five hours due to aching. She is considering trying compression boots for additional support.  She is gradually reducing her hydrocodone  use, currently taking it every seven hours, and is cautious about avoiding addiction. She is also using Advil sparingly due to concerns about blood thinners. She can walk without a cane on flat surfaces and is working on improving her ability to stand from a sitting position without using her hands.  She  mentions seeing a heart doctor, Dr. Couturo, who advised her on tapering off hydrocodone . She is also trying red light therapy to aid healing and is focused on strengthening both her legs, as her right leg also needs strengthening.    Assessment & Plan: Visit Diagnoses:  1. S/P total knee arthroplasty, left     Plan: Assessment and Plan Assessment & Plan   Fibular neck fracture of the left knee Fracture likely due to stress from implantation. Bone density adequate. Healing ongoing. No future complications expected. - Obtain two-view radiographs of the left knee in four weeks to assess healing and fusion.  Status post left total knee arthroplasty Postoperative recovery progressing with slow improvement. Incision healed. Ambulating with cane. Sit-to-stand transitions remain challenging. - Continue physical therapy and transition to gym exercises post-therapy. - Encouraged walking, aiming for a mile and a half daily. - Recommended exercises to improve sit-to-stand strength, starting with a higher chair and gradually lowering it.  Postoperative pain following left knee arthroplasty Pain managed with tapering hydrocodone . Compression socks used. Considering compression boots. Red light therapy tried. Cautious NSAID use due to blood thinners. - Continue tapering hydrocodone  as per cardiologist's advice. - Consider using compression socks and explore compression boots for additional support. - Try red light therapy for pain relief. - Minimize nonsteroidals as per her cardiologist request    Follow-Up Instructions: Return in about 4 weeks (around 11/28/2024).  Ortho Exam  Patient is alert, oriented, no adenopathy, well-dressed, normal affect, normal respiratory effort. Physical Exam   Left knee with almost full extension. Patient ambulated with a cane. SKIN: Incision well healed.    Imaging: No results found. No images are attached to the encounter.  Labs: No results found  for: HGBA1C, ESRSEDRATE, CRP, LABURIC, REPTSTATUS, GRAMSTAIN, CULT, LABORGA   Lab Results  Component Value Date   ALBUMIN 3.2 (L) 05/06/2024    No results found for: MG No results found for: VD25OH  No results found for: PREALBUMIN    Latest Ref Rng & Units 09/08/2024    3:49 AM 09/01/2024    9:46 AM 05/06/2024    7:10 AM  CBC EXTENDED  WBC 4.0 - 10.5 K/uL 14.1  8.2  8.2   RBC 3.87 - 5.11 MIL/uL 3.45  4.10  4.01   Hemoglobin 12.0 - 15.0 g/dL 89.8  88.0  87.8   HCT 36.0 - 46.0 % 31.6  37.4  37.9   Platelets 150 - 400 K/uL 278  246  250   NEUT# 1.7 - 7.7 K/uL   5.3   Lymph# 0.7 - 4.0 K/uL   2.2      There is no height or weight on file to calculate BMI.  Orders:  No orders of the defined types were placed in this encounter.  No orders of the defined types were placed in this encounter.    Procedures: No procedures performed  Clinical Data: No additional findings.  ROS:  All other systems negative, except as noted in the HPI. Review of Systems  Objective: Vital Signs: There were no vitals taken for this visit.  Specialty Comments:  No specialty comments available.  PMFS History: Patient Active Problem List   Diagnosis Date Noted   Arthritis of left knee 09/07/2024   Osteoarthritis of left knee, unspecified osteoarthritis type 09/07/2024   Osteochondral defect of femoral condyle 05/06/2024   Right knee meniscal tear 10/02/2020   Unilateral primary osteoarthritis, right knee 04/25/2020   Low back pain 03/21/2020   Statin myopathy 11/16/2019   Atrophic vaginitis 06/18/2017   Sprain of calcaneofibular ligament of left ankle 01/16/2017   Obesity (BMI 30.0-34.9) 01/05/2015   CAD S/P percutaneous coronary angioplasty 04/21/2013   Hypercholesterolemia 04/21/2013   Essential hypertension 04/21/2013   Past Medical History:  Diagnosis Date   Arthritis    CAD (coronary artery disease)    stents   GERD (gastroesophageal reflux disease)     Headache    Hyperlipidemia    Hypertension     Family History  Problem Relation Age of Onset   COPD Mother    Heart failure Mother    Diabetes Mother    Valvular heart disease Mother        mitral valve leakage   Heart attack Father 52       multiple heart attacks   Heart attack Sister 43   CAD Brother 67   Heart attack Maternal Grandfather    Heart attack Paternal Grandfather        multiple hearts attacks   Stroke Paternal Grandfather     Past Surgical History:  Procedure Laterality Date   CARDIAC CATHETERIZATION  11/18/1999   Percutaneous revascularization precedure with angioplasty to the proximal LAD and first diagonal    CESAREAN SECTION     CHOLECYSTECTOMY     CORONARY STENT PLACEMENT     KNEE ARTHROSCOPY WITH MEDIAL MENISECTOMY Left 05/06/2024   Procedure: LEFT KNEE ARTHROSCOPY  WITH MEDIAL MENISCECTOMY;  Surgeon: Harden Jerona GAILS, MD;  Location: [facility] OR;  Service: Orthopedics;  Laterality: Left;   PARTIAL HYSTERECTOMY     TONSILLECTOMY  11/17/1958   TOTAL KNEE ARTHROPLASTY Left 09/07/2024   Procedure: ARTHROPLASTY, KNEE, TOTAL LEFT;  Surgeon: Harden Jerona GAILS, MD;  Location: [facility] OR;  Service: Orthopedics;  Laterality: Left;   Social History   Occupational History   Not on file  Tobacco Use   Smoking status: Never   Smokeless tobacco: Never  Vaping Use   Vaping status: Never Used  Substance and Sexual Activity   Alcohol use: No    Alcohol/week: 0.0 standard drinks of alcohol   Drug use: No   Sexual activity: Not Currently

## 2024-10-31 NOTE — Therapy (Signed)
 OUTPATIENT PHYSICAL THERAPY TREATMENT / PROGRESS NOTE   Patient Name: Madison Mosley MRN: [id number] DOB:01/27/1951, 73 y.o., female Today's Date: 10/31/2024  Progress Note Reporting Period 09/22/2024 to 10/31/2024  See note below for Objective Data and Assessment of Progress/Goals.      END OF SESSION:  PT End of Session - 10/31/24 1300     Visit Number 10    Number of Visits 25    Date for Recertification  12/15/24    Authorization Type [employer]    Authorization Time Period 09/22/2024 - 12/01/2024    Authorization - Visit Number 10    Authorization - Number of Visits 20    Progress Note Due on Visit 20    PT Start Time 1300    PT Stop Time 1340    PT Time Calculation (min) 40 min    Activity Tolerance Patient tolerated treatment well    Behavior During Therapy WFL for tasks assessed/performed            Past Medical History:  Diagnosis Date   Arthritis    CAD (coronary artery disease)    stents   GERD (gastroesophageal reflux disease)    Headache    Hyperlipidemia    Hypertension    Past Surgical History:  Procedure Laterality Date   CARDIAC CATHETERIZATION  11/18/1999   Percutaneous revascularization precedure with angioplasty to the proximal LAD and first diagonal    CESAREAN SECTION     CHOLECYSTECTOMY     CORONARY STENT PLACEMENT     KNEE ARTHROSCOPY WITH MEDIAL MENISECTOMY Left 05/06/2024   Procedure: LEFT KNEE ARTHROSCOPY WITH MEDIAL MENISCECTOMY;  Surgeon: Harden Jerona GAILS, MD;  Location: [facility] OR;  Service: Orthopedics;  Laterality: Left;   PARTIAL HYSTERECTOMY     TONSILLECTOMY  11/17/1958   TOTAL KNEE ARTHROPLASTY Left 09/07/2024   Procedure: ARTHROPLASTY, KNEE, TOTAL LEFT;  Surgeon: Harden Jerona GAILS, MD;  Location: [facility] OR;  Service: Orthopedics;  Laterality: Left;   Patient Active Problem List   Diagnosis Date Noted   Arthritis of left knee 09/07/2024   Osteoarthritis of left knee, unspecified osteoarthritis type 09/07/2024   Osteochondral  defect of femoral condyle 05/06/2024   Right knee meniscal tear 10/02/2020   Unilateral primary osteoarthritis, right knee 04/25/2020   Low back pain 03/21/2020   Statin myopathy 11/16/2019   Atrophic vaginitis 06/18/2017   Sprain of calcaneofibular ligament of left ankle 01/16/2017   Obesity (BMI 30.0-34.9) 01/05/2015   CAD S/P percutaneous coronary angioplasty 04/21/2013   Hypercholesterolemia 04/21/2013   Essential hypertension 04/21/2013    PCP: Clarice Nottingham, MD   REFERRING PROVIDER: Harden Jerona GAILS, MD    REFERRING DIAG:  [phone] (ICD-10-CM) - Osteoarthritis of left knee, unspecified osteoarthritis type  Z96.652 (ICD-10-CM) - S/P total knee arthroplasty, left    THERAPY DIAG:  Acute pain of left knee  Localized edema  Difficulty in walking, not elsewhere classified  Muscle weakness (generalized)  Other abnormalities of gait and mobility  Rationale for Evaluation and Treatment: Rehabilitation  ONSET DATE: 09/07/24 L TKA  SUBJECTIVE:   SUBJECTIVE STATEMENT: Pt indicated 4-5/10 pain today, not sure why.  Pt indicated trying to go a little longer on pain medicine timelines.  Pt indicated walking short distances level surfaces without cane.  Indicated more activity standing this weekend.   PERTINENT HISTORY: Fracture noted on fibula.   L knee pain started in April.  She had a meniscus surgery in June but pain continued.  Now s/p TKA.  No  CPM.  Had 6 HHPT visits.  PAIN:  NPRS scale: 4-5/10 Pain location: Lt anterior knee Pain description: dull, achy Aggravating factors: motion, bending, extending  Relieving factors: medication, ice  PRECAUTIONS: None  RED FLAGS: None   WEIGHT BEARING RESTRICTIONS: No  FALLS:  Has patient fallen in last 6 months? No  LIVING ENVIRONMENT: Lives with: lives with their spouse Lives in: House/apartment Stairs: Yes: External: 2.5 steps; on right going up Has following equipment at home: Vannie - 2 wheeled  OCCUPATION:  retired   PLOF: Independent  PATIENT GOALS: decrease pain   NEXT MD VISIT: not listed  OBJECTIVE:  Note: Objective measures were completed at Evaluation unless otherwise noted.  DIAGNOSTIC FINDINGS: MRI 04/12/24  IMPRESSION: Tricompartmental osteoarthrosis. Mild to moderate chondromalacia with moderate reactive joint effusion.   Moderate radial tear at the root of the medial meniscus with medial displacement of body. There is second likely radial tear at the junction the body anterior horn of the medial meniscus. See above for more detail.  PATIENT SURVEYS:  PSFS: THE PATIENT SPECIFIC FUNCTIONAL SCALE  Place score of 0-10 (0 = unable to perform activity and 10 = able to perform activity at the same level as before injury or problem)  Activity Date: 09/22/24 10/20/2024 10/31/2024  2 Steps no railing 0 0 4  2.driving 0 5 9  3.Shower independently 0 8 10  4. Tranfers without hands   1       Total Score 0 4.3 avg 6 avg    Total Score = Sum of activity scores/number of activities  Minimally Detectable Change: 3 points (for single activity); 2 points (for average score)  [facility] (nd). The Patient Specific Functional Scale . Retrieved from [email or web address]   COGNITION: 09/22/2024 Overall cognitive status: Within functional limits for tasks assessed     SENSATION: 09/22/2024 [facility]  EDEMA:  09/22/2024 Circumferential: 55 cm  MUSCLE LENGTH: 09/22/2024 Hamstrings:  Left mod restriction    POSTURE: No Significant postural limitations  PALPATION: 09/22/2024 2+ tenderness at anterior knee   LOWER EXTREMITY ROM:   Right eval Left Eval 09/22/2024 Active/Passive Left  09/29/24 Active/Passive Right 10/11/2024 Left 10/11/2024 Left 10/26/2024  Hip flexion        Hip extension        Hip abduction        Hip adduction        Hip internal rotation        Hip external rotation        Knee flexion   80/84 90/92 124 105 102  Knee extension  +9/+5 +8AA 0 -6 -5  Ankle dorsiflexion        Ankle plantarflexion        Ankle inversion        Ankle eversion         (Blank rows = not tested)  LOWER EXTREMITY MMT:  MMT Right eval Left Eval 09/22/2024 Right 10/31/2024 Left 10/31/2024  Hip flexion  4    Hip extension      Hip abduction  3+    Hip adduction      Hip internal rotation      Hip external rotation      Knee flexion  3    Knee extension  3 38, 37 lbs 28.4, 26 lbs  Ankle dorsiflexion      Ankle plantarflexion      Ankle inversion      Ankle eversion       (Blank  rows = not tested)  LOWER EXTREMITY SPECIAL TESTS:  09/22/2024 S/P no special tests completed  FUNCTIONAL TESTS:  09/22/2024 5 times sit to stand: 32 sec  GAIT: 10/24/2024: Ambulation with SPC in Rt UE, antalgic gait noted with trunk lean away from Lt with reduced stance, TKE, toe press off with Lt leg.   10/17/2024: Ambulation to clinic and in clinic with SPC in Rt UE. Reduced stance on Lt, lacking TKE.   09/22/2024 Distance walked: household Assistive device utilized: [employer] - 2 wheeled Level of assistance: Complete Independence Comments: good form                      TREATMENT        DATE: 10/31/2024    Therex: Recumbent bike seat 9 for ROM, endurance lvl 3 10 mins  Incilne gastroc stretch 30 sec x 5   Manual Seated Lt knee flexion with distraction for mobility gains.    Neuro Re-ed (balance , coordination improvements) 6 inch hurdle step to pattern in // bars with occasional HHA 10 ft x 3 each LE leading (difficulty with Lt knee flexion over hurdle due to ROM) 6 inch hurdle lateral step in // bars with occasional HHA 10 ft x 3 each way   TherActivity: (improve transfers, ambulation, stairs) Step on over and down Lt leg WB 4 inch step in // bars with occasional HHA x 10  Lateral step down 4 inch step WB on Lt leg x 10 with slow lowering focus (performed bilaterally)  Patient deferred vaso.     TREATMENT        DATE: 10/26/2024   Recumbent bike Seat 9 for 5 minutes Level 3 Seated straight leg raises 3 sets of 5, emphasis on pausing 2 seconds  before the lift and slow eccentrics with no quadriceps lag Seated knee flexion active-assisted range of motion 10 x 10 seconds Quad sets with left heel prop 10 x 5 seconds  Functional Activities: Double Leg Press 75# for 15 x slow eccentrics full extension and stretch into flexion full extension and stretch into flexion Single Leg Press 31# for 10 x slow eccentrics   Neuromuscular re-education: Wide to more narrow tandem balance 10 x 10 seconds  Vaso Medium Pressure 34* 10 minutes 34* Left knee   TREATMENT        DATE: 10/24/2024    Therex: Nustep lvl 5 UE/LE 10 mins UE/LE for ROM.    Neuro Re-ed Tandem ambulation in // bars 10 ft x 4 with occasional HHA Tandem stance on ground in // bars 1 min x 2 bilaterally with occasional HHA  Retro step weight shifting x 10 bilaterally   TherActivity: (improve transfers, ambulation, stairs) Leg press double leg 75 lbs x 15 slow lowering Leg press single leg 2 x 15 31 lbs , performed bilaterally    TREATMENT        DATE: 10/20/2024    Therex: Recumbent bike lvl 1 seat 8 , 10 mins. Partial revolution to full rev at times Incline gastroc stretch 30 sec x 5 bilaterally  Seated Lt  leg LAQ with end range pauses each direction with contralateral leg movement opposite x 10, 2 x 10 3 lb weight Seated Lt extension/flexion isometric alternating 5 sec hold each x 12  Supine heel slide with quad set combo 5 sec hold x 10 each Lt leg  Manual Seated Lt knee flexion with passive stretch, contract/relax for knee flexion gains.    PATIENT EDUCATION:  Education details: HEP Person educated: Patient Education method: Explanation, Demonstration, Tactile cues, and Verbal cues Education comprehension: verbalized understanding  HOME EXERCISE PROGRAM: Access Code: [email or web address] URL:  [email or web address] Date: 10/11/2024 Prepared by: Lamar Ivory  Exercises - Supine Quad Set  - 2 x daily - 7 x weekly - 2 sets - 10 reps - 3 hold - Supine Active Straight Leg Raise  - 2 x daily - 7 x weekly - 2 sets - 10 reps - Supine Heel Slides  - 2 x daily - 7 x weekly - 2 sets - 10 reps - Heel Raises with Counter Support  - 2 x daily - 7 x weekly - 2 sets - 10 reps - Mini Squat with Counter Support  - 2 x daily - 7 x weekly - 2 sets - 10 reps - Standing March with Counter Support  - 2 x daily - 7 x weekly - 2 sets - 10 reps - Supine Quadricep Sets  - 5 x daily - 7 x weekly - 2 sets - 10 reps - 5 second hold - Seated Straight Leg Raise   - 2 x daily - 7 x weekly - 3-5 sets - 5 reps  ASSESSMENT:  CLINICAL IMPRESSION: The patient has attended 10 visits over the course of treatment cycle.  Patient has reported overall improvement at 70%.  See objective data above for updated information regarding current presentation.  PSFS improved as noted.  Range and strength has improved but continued gains to help progress towards goals.  Medical necessity for continued skilled PT services indicated at this time.    OBJECTIVE IMPAIRMENTS: decreased balance, decreased endurance, difficulty walking, decreased ROM, decreased strength, hypomobility, increased edema, impaired flexibility, and pain.   ACTIVITY LIMITATIONS: bending, sitting, standing, squatting, stairs, transfers, bed mobility, bathing, and toileting  PARTICIPATION LIMITATIONS: meal prep, driving, and community activity  PERSONAL FACTORS: --are also affecting patient's functional outcome.   REHAB POTENTIAL: Good  CLINICAL DECISION MAKING: Stable/uncomplicated  EVALUATION COMPLEXITY: Moderate   GOALS: Goals reviewed with patient? Yes  SHORT TERM GOALS: Target date: 10/20/2024 4 weeks    Pt to be independent with HEP. Baseline: Goal status: Met 10/20/2024  2.  Decrease pain by 1 level. Baseline:  Goal status:  Met 10/11/2024  3.  Pt to be able to ambulate short community distances with SPC. Baseline: RW Goal status: Met 10/11/2024  LONG TERM GOALS: Target date: 12/15/2024  12 weeks    Pt to be independent with self progressive HEP at discharge.   Baseline:  Goal status: on going 10/31/2024  2.  Decrease pain to max 2/10 with all activities. Baseline:  Goal status: on going 10/31/2024  3.  Increase AROM to 0> 105+L knee.  Goal status: on going 10/31/2024  4.  Increase strength to at least 4>4+/5 in L LE.  Goal status: on going 10/31/2024  5.  Able to ambulate community distances without an AD.  Goal status: on going 10/31/2024  6.  Increase score of PSFS to > 5.  Goal status: met on average 10/31/2024   PLAN:  PT FREQUENCY: 2x/week  PT DURATION: 10 weeks  PLANNED INTERVENTIONS: 97164- PT Re-evaluation, 97110-Therapeutic exercises, 97530- Therapeutic activity, 97112- Neuromuscular re-education, 97535- Self Care, [id number]- Manual therapy, [phone]- Gait training, [phone]- Aquatic Therapy, [phone]- Electrical stimulation (unattended), [phone]- Vasopneumatic device, Patient/Family education, Balance training, Stair training, Joint mobilization, and Scar mobilization  PLAN FOR NEXT SESSION:Continued bilateral LE strengthening, range gains.   Check on MD follow up.  Be aware of her fibular fracture.   Ozell Silvan, PT, DPT, OCS, ATC 10/31/2024  1:43 PM     Date of referral: 09/08/24 Referring provider: Jerona Sage MD Referring diagnosis?  M17.12 (ICD-10-CM) - Osteoarthritis of left knee, unspecified osteoarthritis type  Z96.652 (ICD-10-CM) - S/P total knee arthroplasty, left   Treatment diagnosis? (if different than referring diagnosis) M25.562  R60.0  R26.2  F37.18M73.10  What was this (referring dx) caused by? Arthritis  Nature of Condition: Chronic (continuous duration > 3 months)   Laterality: Lt  Current Functional Measure Score: Patient Specific Functional Scale  0  Objective measurements identify impairments when they are compared to normal values, the uninvolved extremity, and prior level of function.  [x]  Yes  []  No  Objective assessment of functional ability: Severe functional limitations   Briefly describe symptoms: pain, decreased ROM, decreased strength  How did symptoms start: Pain in knee started in the spring  Average pain intensity:  Last 24 hours: 6/10  Past week: 9/10  How often does the pt experience symptoms? Constantly  How much have the symptoms interfered with usual daily activities? Extremely  How has condition changed since care began at this facility? NA - initial visit  In general, how is the patients overall health? Good   BACK PAIN (STarT Back Screening Tool) No

## 2024-10-31 NOTE — Telephone Encounter (Signed)
 Dr. Harden did call and discuss with patient.

## 2024-11-02 ENCOUNTER — Ambulatory Visit: Admitting: Rehabilitative and Restorative Service Providers"

## 2024-11-02 ENCOUNTER — Encounter: Payer: Self-pay | Admitting: Rehabilitative and Restorative Service Providers"

## 2024-11-02 DIAGNOSIS — M25562 Pain in left knee: Secondary | ICD-10-CM | POA: Diagnosis not present

## 2024-11-02 DIAGNOSIS — M6281 Muscle weakness (generalized): Secondary | ICD-10-CM | POA: Diagnosis not present

## 2024-11-02 DIAGNOSIS — R2689 Other abnormalities of gait and mobility: Secondary | ICD-10-CM

## 2024-11-02 DIAGNOSIS — R600 Localized edema: Secondary | ICD-10-CM

## 2024-11-02 DIAGNOSIS — R262 Difficulty in walking, not elsewhere classified: Secondary | ICD-10-CM | POA: Diagnosis not present

## 2024-11-02 NOTE — Therapy (Signed)
 OUTPATIENT PHYSICAL THERAPY TREATMENT   Patient Name: Madison Mosley MRN: [id number] DOB:July 23, 1951, 73 y.o., female Today's Date: 11/02/2024  END OF SESSION:  PT End of Session - 11/02/24 1304     Visit Number 11    Number of Visits 25    Date for Recertification  12/15/24    Authorization Type [employer] / [place]    Authorization Time Period 09/22/2024 - 12/01/2024    Authorization - Visit Number 11    Authorization - Number of Visits 20    Progress Note Due on Visit 20    PT Start Time 1259    PT Stop Time 1341    PT Time Calculation (min) 42 min    Activity Tolerance Patient tolerated treatment well    Behavior During Therapy WFL for tasks assessed/performed             Past Medical History:  Diagnosis Date   Arthritis    CAD (coronary artery disease)    stents   GERD (gastroesophageal reflux disease)    Headache    Hyperlipidemia    Hypertension    Past Surgical History:  Procedure Laterality Date   CARDIAC CATHETERIZATION  11/18/1999   Percutaneous revascularization precedure with angioplasty to the proximal LAD and first diagonal    CESAREAN SECTION     CHOLECYSTECTOMY     CORONARY STENT PLACEMENT     KNEE ARTHROSCOPY WITH MEDIAL MENISECTOMY Left 05/06/2024   Procedure: LEFT KNEE ARTHROSCOPY WITH MEDIAL MENISCECTOMY;  Surgeon: Harden Jerona GAILS, MD;  Location: [facility] OR;  Service: Orthopedics;  Laterality: Left;   PARTIAL HYSTERECTOMY     TONSILLECTOMY  11/17/1958   TOTAL KNEE ARTHROPLASTY Left 09/07/2024   Procedure: ARTHROPLASTY, KNEE, TOTAL LEFT;  Surgeon: Harden Jerona GAILS, MD;  Location: [facility] OR;  Service: Orthopedics;  Laterality: Left;   Patient Active Problem List   Diagnosis Date Noted   Arthritis of left knee 09/07/2024   Osteoarthritis of left knee, unspecified osteoarthritis type 09/07/2024   Osteochondral defect of femoral condyle 05/06/2024   Right knee meniscal tear 10/02/2020   Unilateral primary osteoarthritis, right knee 04/25/2020    Low back pain 03/21/2020   Statin myopathy 11/16/2019   Atrophic vaginitis 06/18/2017   Sprain of calcaneofibular ligament of left ankle 01/16/2017   Obesity (BMI 30.0-34.9) 01/05/2015   CAD S/P percutaneous coronary angioplasty 04/21/2013   Hypercholesterolemia 04/21/2013   Essential hypertension 04/21/2013    PCP: Clarice Nottingham, MD   REFERRING PROVIDER: Harden Jerona GAILS, MD    REFERRING DIAG:  [phone] (ICD-10-CM) - Osteoarthritis of left knee, unspecified osteoarthritis type  Z96.652 (ICD-10-CM) - S/P total knee arthroplasty, left    THERAPY DIAG:  Acute pain of left knee  Localized edema  Difficulty in walking, not elsewhere classified  Muscle weakness (generalized)  Other abnormalities of gait and mobility  Rationale for Evaluation and Treatment: Rehabilitation  ONSET DATE: 09/07/24 Lt TKA  SUBJECTIVE:   SUBJECTIVE STATEMENT: Reported pinch feel in last day or so.  Pt indicated seeing MD for follow up and indicated xray update in 4 weeks.  Pt indicated symptoms rest of day after last visit wasn't painful but reported tired.   PERTINENT HISTORY: Fracture noted on fibula.   L knee pain started in April.  She had a meniscus surgery in June but pain continued.  Now s/p TKA.  No CPM.  Had 6 HHPT visits.  PAIN:  NPRS scale: 3-4/10 Pain location: Lt anterior knee Pain description: dull, achy Aggravating  factors: motion, bending, extending  Relieving factors: medication, ice  PRECAUTIONS: None  RED FLAGS: None   WEIGHT BEARING RESTRICTIONS: No  FALLS:  Has patient fallen in last 6 months? No  LIVING ENVIRONMENT: Lives with: lives with their spouse Lives in: House/apartment Stairs: Yes: External: 2.5 steps; on right going up Has following equipment at home: Vannie - 2 wheeled  OCCUPATION: retired   PLOF: Independent  PATIENT GOALS: decrease pain   NEXT MD VISIT: not listed  OBJECTIVE:  Note: Objective measures were completed at Evaluation unless  otherwise noted.  DIAGNOSTIC FINDINGS: MRI 04/12/24  IMPRESSION: Tricompartmental osteoarthrosis. Mild to moderate chondromalacia with moderate reactive joint effusion.   Moderate radial tear at the root of the medial meniscus with medial displacement of body. There is second likely radial tear at the junction the body anterior horn of the medial meniscus. See above for more detail.  PATIENT SURVEYS:  PSFS: THE PATIENT SPECIFIC FUNCTIONAL SCALE  Place score of 0-10 (0 = unable to perform activity and 10 = able to perform activity at the same level as before injury or problem)  Activity Date: 09/22/24 10/20/2024 10/31/2024  2 Steps no railing 0 0 4  2.driving 0 5 9  3.Shower independently 0 8 10  4. Tranfers without hands   1       Total Score 0 4.3 avg 6 avg    Total Score = Sum of activity scores/number of activities  Minimally Detectable Change: 3 points (for single activity); 2 points (for average score)  [facility] (nd). The Patient Specific Functional Scale . Retrieved from [email or web address]   COGNITION: 09/22/2024 Overall cognitive status: Within functional limits for tasks assessed     SENSATION: 09/22/2024 [facility]  EDEMA:  09/22/2024 Circumferential: 55 cm  MUSCLE LENGTH: 09/22/2024 Hamstrings:  Left mod restriction    POSTURE: No Significant postural limitations  PALPATION: 09/22/2024 2+ tenderness at anterior knee   LOWER EXTREMITY ROM:   Right eval Left Eval 09/22/2024 Active/Passive Left  09/29/24 Active/Passive Right 10/11/2024 Left 10/11/2024 Left 10/26/2024  Hip flexion        Hip extension        Hip abduction        Hip adduction        Hip internal rotation        Hip external rotation        Knee flexion  80/84 90/92 124 105 102  Knee extension  +9/+5 +8AA 0 -6 -5  Ankle dorsiflexion        Ankle plantarflexion        Ankle inversion        Ankle eversion          (Blank rows = not tested)  LOWER EXTREMITY MMT:  MMT Right eval Left Eval 09/22/2024 Right 10/31/2024 Left 10/31/2024  Hip flexion  4    Hip extension      Hip abduction  3+    Hip adduction      Hip internal rotation      Hip external rotation      Knee flexion  3    Knee extension  3 38, 37 lbs 28.4, 26 lbs  Ankle dorsiflexion      Ankle plantarflexion      Ankle inversion      Ankle eversion       (Blank rows = not tested)  LOWER EXTREMITY SPECIAL TESTS:  09/22/2024 S/P no special tests completed  FUNCTIONAL TESTS:  09/22/2024 5  times sit to stand: 32 sec  GAIT: 10/24/2024: Ambulation with SPC in Rt UE, antalgic gait noted with trunk lean away from Lt with reduced stance, TKE, toe press off with Lt leg.   10/17/2024: Ambulation to clinic and in clinic with SPC in Rt UE. Reduced stance on Lt, lacking TKE.   09/22/2024 Distance walked: household Assistive device utilized: [employer] - 2 wheeled Level of assistance: Complete Independence Comments: good form                      TREATMENT        DATE: 11/02/2024    Therex: Recumbent bike seat 9 for ROM, endurance lvl 3 10 mins  Incilne gastroc stretch 30 sec x 3 bilaterally  Knee machine double leg up, Lt leg lowering 5 lb weight x 10 (3 slots from back flexion setup).  Additional time spent in cues for activity/set up as well as education about soreness possibility due to new intervention.    TherActivity: (improve transfers, ambulation, stairs) Leg press double leg 87 lbs slow lowering focus x 15  Leg press single leg 43 lbs 2 x 10 , performed bilaterally   Manual Seated Lt knee flexion c distraction/IR for flexion mobility gains and symptom relief.    TREATMENT        DATE: 10/31/2024    Therex: Recumbent bike seat 9 for ROM, endurance lvl 3 10 mins  Incilne gastroc stretch 30 sec x 5   Manual Seated Lt knee flexion with distraction for mobility gains.    Neuro Re-ed (balance , coordination  improvements) 6 inch hurdle step to pattern in // bars with occasional HHA 10 ft x 3 each LE leading (difficulty with Lt knee flexion over hurdle due to ROM) 6 inch hurdle lateral step in // bars with occasional HHA 10 ft x 3 each way   TherActivity: (improve transfers, ambulation, stairs) Step on over and down Lt leg WB 4 inch step in // bars with occasional HHA x 10  Lateral step down 4 inch step WB on Lt leg x 10 with slow lowering focus (performed bilaterally)  Patient deferred vaso.    TREATMENT        DATE: 10/26/2024   Recumbent bike Seat 9 for 5 minutes Level 3 Seated straight leg raises 3 sets of 5, emphasis on pausing 2 seconds  before the lift and slow eccentrics with no quadriceps lag Seated knee flexion active-assisted range of motion 10 x 10 seconds Quad sets with left heel prop 10 x 5 seconds  Functional Activities: Double Leg Press 75# for 15 x slow eccentrics full extension and stretch into flexion full extension and stretch into flexion Single Leg Press 31# for 10 x slow eccentrics   Neuromuscular re-education: Wide to more narrow tandem balance 10 x 10 seconds  Vaso Medium Pressure 34* 10 minutes 34* Left knee   PATIENT EDUCATION:  Education details: HEP Person educated: Patient Education method: Explanation, Demonstration, Tactile cues, and Verbal cues Education comprehension: verbalized understanding  HOME EXERCISE PROGRAM: Access Code: [email or web address] URL: [email or web address] Date: 10/11/2024 Prepared by: Lamar Ivory  Exercises - Supine Quad Set  - 2 x daily - 7 x weekly - 2 sets - 10 reps - 3 hold - Supine Active Straight Leg Raise  - 2 x daily - 7 x weekly - 2 sets - 10 reps - Supine Heel Slides  - 2 x daily - 7 x weekly - 2 sets - 10 reps -  Heel Raises with Counter Support  - 2 x daily - 7 x weekly - 2 sets - 10 reps - Mini Squat with Counter Support  - 2 x daily - 7 x weekly - 2 sets - 10 reps - Standing March with Counter Support  -  2 x daily - 7 x weekly - 2 sets - 10 reps - Supine Quadricep Sets  - 5 x daily - 7 x weekly - 2 sets - 10 reps - 5 second hold - Seated Straight Leg Raise   - 2 x daily - 7 x weekly - 3-5 sets - 5 reps  ASSESSMENT:  CLINICAL IMPRESSION: Continued progression on strengthen ing intervention to promote continued strength gains.  Pt indicated able to progress resistance on several activity which was positive.    OBJECTIVE IMPAIRMENTS: decreased balance, decreased endurance, difficulty walking, decreased ROM, decreased strength, hypomobility, increased edema, impaired flexibility, and pain.   ACTIVITY LIMITATIONS: bending, sitting, standing, squatting, stairs, transfers, bed mobility, bathing, and toileting  PARTICIPATION LIMITATIONS: meal prep, driving, and community activity  PERSONAL FACTORS: --are also affecting patient's functional outcome.   REHAB POTENTIAL: Good  CLINICAL DECISION MAKING: Stable/uncomplicated  EVALUATION COMPLEXITY: Moderate   GOALS: Goals reviewed with patient? Yes  SHORT TERM GOALS: Target date: 10/20/2024 4 weeks    Pt to be independent with HEP. Baseline: Goal status: Met 10/20/2024  2.  Decrease pain by 1 level. Baseline:  Goal status: Met 10/11/2024  3.  Pt to be able to ambulate short community distances with SPC. Baseline: RW Goal status: Met 10/11/2024  LONG TERM GOALS: Target date: 12/15/2024  12 weeks    Pt to be independent with self progressive HEP at discharge.   Baseline:  Goal status: on going 10/31/2024  2.  Decrease pain to max 2/10 with all activities. Baseline:  Goal status: on going 10/31/2024  3.  Increase AROM to 0> 105+L knee.  Goal status: on going 10/31/2024  4.  Increase strength to at least 4>4+/5 in L LE.  Goal status: on going 10/31/2024  5.  Able to ambulate community distances without an AD.  Goal status: on going 10/31/2024  6.  Increase score of PSFS to > 5.  Goal status: met on average  10/31/2024   PLAN:  PT FREQUENCY: 2x/week  PT DURATION: 10 weeks  PLANNED INTERVENTIONS: 97164- PT Re-evaluation, 97110-Therapeutic exercises, 97530- Therapeutic activity, 97112- Neuromuscular re-education, 97535- Self Care, 02859- Manual therapy, [phone]- Gait training, [phone]- Aquatic Therapy, [phone]- Electrical stimulation (unattended), 97016- Vasopneumatic device, Patient/Family education, Balance training, Stair training, Joint mobilization, and Scar mobilization  PLAN FOR NEXT SESSION:  Check on eccentric knee extension machine activity response.   Be aware of her fibular fracture.   Ozell Silvan, PT, DPT, OCS, ATC 11/02/2024  1:43 PM      Date of referral: 09/08/24 Referring provider: Jerona Sage MD Referring diagnosis?  M17.12 (ICD-10-CM) - Osteoarthritis of left knee, unspecified osteoarthritis type  Z96.652 (ICD-10-CM) - S/P total knee arthroplasty, left   Treatment diagnosis? (if different than referring diagnosis) M25.562  R60.0  R26.2  F37.18M73.10  What was this (referring dx) caused by? Arthritis  Nature of Condition: Chronic (continuous duration > 3 months)   Laterality: Lt  Current Functional Measure Score: Patient Specific Functional Scale 0  Objective measurements identify impairments when they are compared to normal values, the uninvolved extremity, and prior level of function.  [x]  Yes  []  No  Objective assessment of functional ability: Severe functional limitations  Briefly describe symptoms: pain, decreased ROM, decreased strength  How did symptoms start: Pain in knee started in the spring  Average pain intensity:  Last 24 hours: 6/10  Past week: 9/10  How often does the pt experience symptoms? Constantly  How much have the symptoms interfered with usual daily activities? Extremely  How has condition changed since care began at this facility? NA - initial visit  In general, how is the patients overall health? Good   BACK PAIN (STarT  Back Screening Tool) No

## 2024-11-08 ENCOUNTER — Ambulatory Visit (INDEPENDENT_AMBULATORY_CARE_PROVIDER_SITE_OTHER)

## 2024-11-08 DIAGNOSIS — M25562 Pain in left knee: Secondary | ICD-10-CM

## 2024-11-08 DIAGNOSIS — R262 Difficulty in walking, not elsewhere classified: Secondary | ICD-10-CM

## 2024-11-08 DIAGNOSIS — R600 Localized edema: Secondary | ICD-10-CM

## 2024-11-08 DIAGNOSIS — R2689 Other abnormalities of gait and mobility: Secondary | ICD-10-CM | POA: Diagnosis not present

## 2024-11-08 DIAGNOSIS — M6281 Muscle weakness (generalized): Secondary | ICD-10-CM | POA: Diagnosis not present

## 2024-11-08 NOTE — Therapy (Signed)
 " OUTPATIENT PHYSICAL THERAPY TREATMENT   Patient Name: Madison Mosley MRN: [id number] DOB:[birth year], 73 y.o., female Today's Date: 11/08/2024  END OF SESSION:  PT End of Session - 11/08/24 1352     Visit Number 12    Number of Visits 25    Date for Recertification  12/15/24    Authorization Type [employer] / [place]    Authorization Time Period 09/22/2024 - 12/01/2024    Authorization - Visit Number 12    Authorization - Number of Visits 20    Progress Note Due on Visit 20    PT Start Time 1349    PT Stop Time 1430    PT Time Calculation (min) 41 min    Activity Tolerance Patient tolerated treatment well    Behavior During Therapy WFL for tasks assessed/performed              Past Medical History:  Diagnosis Date   Arthritis    CAD (coronary artery disease)    stents   GERD (gastroesophageal reflux disease)    Headache    Hyperlipidemia    Hypertension    Past Surgical History:  Procedure Laterality Date   CARDIAC CATHETERIZATION  11/18/1999   Percutaneous revascularization precedure with angioplasty to the proximal LAD and first diagonal    CESAREAN SECTION     CHOLECYSTECTOMY     CORONARY STENT PLACEMENT     KNEE ARTHROSCOPY WITH MEDIAL MENISECTOMY Left 05/06/2024   Procedure: LEFT KNEE ARTHROSCOPY WITH MEDIAL MENISCECTOMY;  Surgeon: Harden Jerona GAILS, MD;  Location: [facility] OR;  Service: Orthopedics;  Laterality: Left;   PARTIAL HYSTERECTOMY     TONSILLECTOMY  11/17/1958   TOTAL KNEE ARTHROPLASTY Left 09/07/2024   Procedure: ARTHROPLASTY, KNEE, TOTAL LEFT;  Surgeon: Harden Jerona GAILS, MD;  Location: [facility] OR;  Service: Orthopedics;  Laterality: Left;   Patient Active Problem List   Diagnosis Date Noted   Arthritis of left knee 09/07/2024   Osteoarthritis of left knee, unspecified osteoarthritis type 09/07/2024   Osteochondral defect of femoral condyle 05/06/2024   Right knee meniscal tear 10/02/2020   Unilateral primary osteoarthritis, right knee 04/25/2020    Low back pain 03/21/2020   Statin myopathy 11/16/2019   Atrophic vaginitis 06/18/2017   Sprain of calcaneofibular ligament of left ankle 01/16/2017   Obesity (BMI 30.0-34.9) 01/05/2015   CAD S/P percutaneous coronary angioplasty 04/21/2013   Hypercholesterolemia 04/21/2013   Essential hypertension 04/21/2013    PCP: Clarice Nottingham, MD   REFERRING PROVIDER: Harden Jerona GAILS, MD    REFERRING DIAG:  [phone] (ICD-10-CM) - Osteoarthritis of left knee, unspecified osteoarthritis type  Z96.652 (ICD-10-CM) - S/P total knee arthroplasty, left    THERAPY DIAG:  Acute pain of left knee  Localized edema  Difficulty in walking, not elsewhere classified  Muscle weakness (generalized)  Other abnormalities of gait and mobility  Rationale for Evaluation and Treatment: Rehabilitation  ONSET DATE: 09/07/24 Lt TKA  SUBJECTIVE:   SUBJECTIVE STATEMENT: Patient reports that she is slowly weaning from pain medication and is having some side effects of withdrawal (watery eyes, sniffles, etc.).   PERTINENT HISTORY: Fracture noted on fibula.   L knee pain started in April.  She had a meniscus surgery in June but pain continued.  Now s/p TKA.  No CPM.  Had 6 HHPT visits.  PAIN:  NPRS scale: 3-4/10 Pain location: Lt anterior knee Pain description: dull, achy Aggravating factors: motion, bending, extending  Relieving factors: medication, ice  PRECAUTIONS: None  RED  FLAGS: None   WEIGHT BEARING RESTRICTIONS: No  FALLS:  Has patient fallen in last 6 months? No  LIVING ENVIRONMENT: Lives with: lives with their spouse Lives in: House/apartment Stairs: Yes: External: 2.5 steps; on right going up Has following equipment at home: Vannie - 2 wheeled  OCCUPATION: retired   PLOF: Independent  PATIENT GOALS: decrease pain   NEXT MD VISIT: not listed  OBJECTIVE:  Note: Objective measures were completed at Evaluation unless otherwise noted.  DIAGNOSTIC FINDINGS: MRI 04/12/24   IMPRESSION: Tricompartmental osteoarthrosis. Mild to moderate chondromalacia with moderate reactive joint effusion.   Moderate radial tear at the root of the medial meniscus with medial displacement of body. There is second likely radial tear at the junction the body anterior horn of the medial meniscus. See above for more detail.  PATIENT SURVEYS:  PSFS: THE PATIENT SPECIFIC FUNCTIONAL SCALE  Place score of 0-10 (0 = unable to perform activity and 10 = able to perform activity at the same level as before injury or problem)  Activity Date: 09/22/24 10/20/2024 10/31/2024  2 Steps no railing 0 0 4  2.driving 0 5 9  3.Shower independently 0 8 10  4. Tranfers without hands   1       Total Score 0 4.3 avg 6 avg    Total Score = Sum of activity scores/number of activities  Minimally Detectable Change: 3 points (for single activity); 2 points (for average score)  [facility] (nd). The Patient Specific Functional Scale . Retrieved from [email or web address]   COGNITION: 09/22/2024 Overall cognitive status: Within functional limits for tasks assessed     SENSATION: 09/22/2024 [facility]  EDEMA:  09/22/2024 Circumferential: 55 cm  MUSCLE LENGTH: 09/22/2024 Hamstrings:  Left mod restriction    POSTURE: No Significant postural limitations  PALPATION: 09/22/2024 2+ tenderness at anterior knee   LOWER EXTREMITY ROM:   Right eval Left Eval 09/22/2024 Active/Passive Left  09/29/24 Active/Passive Right 10/11/2024 Left 10/11/2024 Left 10/26/2024  Hip flexion        Hip extension        Hip abduction        Hip adduction        Hip internal rotation        Hip external rotation        Knee flexion  80/84 90/92 124 105 102  Knee extension  +9/+5 +8AA 0 -6 -5  Ankle dorsiflexion        Ankle plantarflexion        Ankle inversion        Ankle eversion         (Blank rows = not tested)  LOWER EXTREMITY MMT:  MMT  Right eval Left Eval 09/22/2024 Right 10/31/2024 Left 10/31/2024  Hip flexion  4    Hip extension      Hip abduction  3+    Hip adduction      Hip internal rotation      Hip external rotation      Knee flexion  3    Knee extension  3 38, 37 lbs 28.4, 26 lbs  Ankle dorsiflexion      Ankle plantarflexion      Ankle inversion      Ankle eversion       (Blank rows = not tested)  LOWER EXTREMITY SPECIAL TESTS:  09/22/2024 S/P no special tests completed  FUNCTIONAL TESTS:  09/22/2024 5 times sit to stand: 32 sec  GAIT: 10/24/2024: Ambulation with SPC in Rt  UE, antalgic gait noted with trunk lean away from Lt with reduced stance, TKE, toe press off with Lt leg.   10/17/2024: Ambulation to clinic and in clinic with SPC in Rt UE. Reduced stance on Lt, lacking TKE.   09/22/2024 Distance walked: household Assistive device utilized: [employer]ant - 2 wheeled Level of assistance: Complete Independence Comments: good form                      TREATMENT        DATE: 11/08/2024    TherEx:  Recumbent bike seat 9 level 3 for 10 minutes  Slant board gastroc stretch 3x30s   TherAct:  Step up and over with 4 box and minimal HHA on // bars 2x12 (step up with Lt, step down with Rt) LOB with first rep, though patient attempted to step down with Lt without UE support  Bilat leg press 2x10 with 87#  Unilat leg press 2x10 with 43#, done with bilat LE  Manual:  Seated Lt knee flexion with distraction/IR with PT providing overpressure    TREATMENT        DATE: 11/02/2024    Therex: Recumbent bike seat 9 for ROM, endurance lvl 3 10 mins  Incilne gastroc stretch 30 sec x 3 bilaterally  Knee machine double leg up, Lt leg lowering 5 lb weight x 10 (3 slots from back flexion setup).  Additional time spent in cues for activity/set up as well as education about soreness possibility due to new intervention.    TherActivity: (improve transfers, ambulation, stairs) Leg press double leg 87 lbs slow  lowering focus x 15  Leg press single leg 43 lbs 2 x 10 , performed bilaterally   Manual Seated Lt knee flexion c distraction/IR for flexion mobility gains and symptom relief.    TREATMENT        DATE: 10/31/2024    Therex: Recumbent bike seat 9 for ROM, endurance lvl 3 10 mins  Incilne gastroc stretch 30 sec x 5   Manual Seated Lt knee flexion with distraction for mobility gains.    Neuro Re-ed (balance , coordination improvements) 6 inch hurdle step to pattern in // bars with occasional HHA 10 ft x 3 each LE leading (difficulty with Lt knee flexion over hurdle due to ROM) 6 inch hurdle lateral step in // bars with occasional HHA 10 ft x 3 each way   TherActivity: (improve transfers, ambulation, stairs) Step on over and down Lt leg WB 4 inch step in // bars with occasional HHA x 10  Lateral step down 4 inch step WB on Lt leg x 10 with slow lowering focus (performed bilaterally)  Patient deferred vaso.    TREATMENT        DATE: 10/26/2024   Recumbent bike Seat 9 for 5 minutes Level 3 Seated straight leg raises 3 sets of 5, emphasis on pausing 2 seconds  before the lift and slow eccentrics with no quadriceps lag Seated knee flexion active-assisted range of motion 10 x 10 seconds Quad sets with left heel prop 10 x 5 seconds  Functional Activities: Double Leg Press 75# for 15 x slow eccentrics full extension and stretch into flexion full extension and stretch into flexion Single Leg Press 31# for 10 x slow eccentrics   Neuromuscular re-education: Wide to more narrow tandem balance 10 x 10 seconds  Vaso Medium Pressure 34* 10 minutes 34* Left knee   PATIENT EDUCATION:  Education details: HEP Person educated: Patient Education  method: Explanation, Demonstration, Tactile cues, and Verbal cues Education comprehension: verbalized understanding  HOME EXERCISE PROGRAM: Access Code: [email or web address] URL: [email or web address] Date: 10/11/2024 Prepared by: Lamar Ivory  Exercises - Supine Quad Set  - 2 x daily - 7 x weekly - 2 sets - 10 reps - 3 hold - Supine Active Straight Leg Raise  - 2 x daily - 7 x weekly - 2 sets - 10 reps - Supine Heel Slides  - 2 x daily - 7 x weekly - 2 sets - 10 reps - Heel Raises with Counter Support  - 2 x daily - 7 x weekly - 2 sets - 10 reps - Mini Squat with Counter Support  - 2 x daily - 7 x weekly - 2 sets - 10 reps - Standing March with Counter Support  - 2 x daily - 7 x weekly - 2 sets - 10 reps - Supine Quadricep Sets  - 5 x daily - 7 x weekly - 2 sets - 10 reps - 5 second hold - Seated Straight Leg Raise   - 2 x daily - 7 x weekly - 3-5 sets - 5 reps  ASSESSMENT:  CLINICAL IMPRESSION: Patient arrived to session noting difficulty with symptoms from weaning from pain medication, but overall felt okay. Patient tolerated all activities this date with minimal increase in soreness with manual therapy. Patient will continue to benefit from skilled PT.   OBJECTIVE IMPAIRMENTS: decreased balance, decreased endurance, difficulty walking, decreased ROM, decreased strength, hypomobility, increased edema, impaired flexibility, and pain.   ACTIVITY LIMITATIONS: bending, sitting, standing, squatting, stairs, transfers, bed mobility, bathing, and toileting  PARTICIPATION LIMITATIONS: meal prep, driving, and community activity  PERSONAL FACTORS: --are also affecting patient's functional outcome.   REHAB POTENTIAL: Good  CLINICAL DECISION MAKING: Stable/uncomplicated  EVALUATION COMPLEXITY: Moderate   GOALS: Goals reviewed with patient? Yes  SHORT TERM GOALS: Target date: 10/20/2024 4 weeks    Pt to be independent with HEP. Baseline: Goal status: Met 10/20/2024  2.  Decrease pain by 1 level. Baseline:  Goal status: Met 10/11/2024  3.  Pt to be able to ambulate short community distances with SPC. Baseline: RW Goal status: Met 10/11/2024  LONG TERM GOALS: Target date: 12/15/2024  12 weeks    Pt to be  independent with self progressive HEP at discharge.   Baseline:  Goal status: on going 10/31/2024  2.  Decrease pain to max 2/10 with all activities. Baseline:  Goal status: on going 10/31/2024  3.  Increase AROM to 0> 105+L knee.  Goal status: on going 10/31/2024  4.  Increase strength to at least 4>4+/5 in L LE.  Goal status: on going 10/31/2024  5.  Able to ambulate community distances without an AD.  Goal status: on going 10/31/2024  6.  Increase score of PSFS to > 5.  Goal status: met on average 10/31/2024   PLAN:  PT FREQUENCY: 2x/week  PT DURATION: 10 weeks  PLANNED INTERVENTIONS: 97164- PT Re-evaluation, 97110-Therapeutic exercises, 97530- Therapeutic activity, 97112- Neuromuscular re-education, 97535- Self Care, [id number]- Manual therapy, [phone]- Gait training, [phone]- Aquatic Therapy, [phone]- Electrical stimulation (unattended), 97016- Vasopneumatic device, Patient/Family education, Balance training, Stair training, Joint mobilization, and Scar mobilization  PLAN FOR NEXT SESSION:   Check on eccentric knee extension machine activity response.   Be aware of her fibular fracture.   Susannah Daring, PT, DPT 11/08/2024 3:38 PM        Date of referral: 09/08/24 Referring provider: Jerona Sage MD Referring diagnosis?  M17.12 (ICD-10-CM) - Osteoarthritis of left knee, unspecified osteoarthritis type  Z96.652 (ICD-10-CM) - S/P total knee arthroplasty, left   Treatment diagnosis? (if different than referring diagnosis) M25.562  R60.0  R26.2  F37.18M73.10  What was this (referring dx) caused by? Arthritis  Nature of Condition: Chronic (continuous duration > 3 months)   Laterality: Lt  Current Functional Measure Score: Patient Specific Functional Scale 0  Objective measurements identify impairments when they are compared to normal values, the uninvolved extremity, and prior level of function.  [x]  Yes  []  No  Objective assessment of functional ability: Severe  functional limitations   Briefly describe symptoms: pain, decreased ROM, decreased strength  How did symptoms start: Pain in knee started in the spring  Average pain intensity:  Last 24 hours: 6/10  Past week: 9/10  How often does the pt experience symptoms? Constantly  How much have the symptoms interfered with usual daily activities? Extremely  How has condition changed since care began at this facility? NA - initial visit  In general, how is the patients overall health? Good   BACK PAIN (STarT Back Screening Tool) No  "

## 2024-11-14 ENCOUNTER — Ambulatory Visit: Admitting: Rehabilitative and Restorative Service Providers"

## 2024-11-14 ENCOUNTER — Encounter: Payer: Self-pay | Admitting: Rehabilitative and Restorative Service Providers"

## 2024-11-14 DIAGNOSIS — R600 Localized edema: Secondary | ICD-10-CM

## 2024-11-14 DIAGNOSIS — R2689 Other abnormalities of gait and mobility: Secondary | ICD-10-CM

## 2024-11-14 DIAGNOSIS — M6281 Muscle weakness (generalized): Secondary | ICD-10-CM

## 2024-11-14 DIAGNOSIS — R262 Difficulty in walking, not elsewhere classified: Secondary | ICD-10-CM

## 2024-11-14 DIAGNOSIS — M25562 Pain in left knee: Secondary | ICD-10-CM

## 2024-11-14 NOTE — Therapy (Signed)
 " OUTPATIENT PHYSICAL THERAPY TREATMENT   Patient Name: Madison Mosley MRN: [id number] DOB:May 22, 1951, 73 y.o., female Today's Date: 11/14/2024  END OF SESSION:  PT End of Session - 11/14/24 1400     Visit Number 13    Number of Visits 25    Date for Recertification  12/15/24    Authorization Type [employer] / [place]    Authorization Time Period 09/22/2024 - 12/01/2024    Authorization - Visit Number 13    Authorization - Number of Visits 20    Progress Note Due on Visit 20    PT Start Time 1344    PT Stop Time 1433    PT Time Calculation (min) 49 min    Activity Tolerance Patient tolerated treatment well    Behavior During Therapy WFL for tasks assessed/performed               Past Medical History:  Diagnosis Date   Arthritis    CAD (coronary artery disease)    stents   GERD (gastroesophageal reflux disease)    Headache    Hyperlipidemia    Hypertension    Past Surgical History:  Procedure Laterality Date   CARDIAC CATHETERIZATION  11/18/1999   Percutaneous revascularization precedure with angioplasty to the proximal LAD and first diagonal    CESAREAN SECTION     CHOLECYSTECTOMY     CORONARY STENT PLACEMENT     KNEE ARTHROSCOPY WITH MEDIAL MENISECTOMY Left 05/06/2024   Procedure: LEFT KNEE ARTHROSCOPY WITH MEDIAL MENISCECTOMY;  Surgeon: Harden Jerona GAILS, MD;  Location: [facility] OR;  Service: Orthopedics;  Laterality: Left;   PARTIAL HYSTERECTOMY     TONSILLECTOMY  11/17/1958   TOTAL KNEE ARTHROPLASTY Left 09/07/2024   Procedure: ARTHROPLASTY, KNEE, TOTAL LEFT;  Surgeon: Harden Jerona GAILS, MD;  Location: [facility] OR;  Service: Orthopedics;  Laterality: Left;   Patient Active Problem List   Diagnosis Date Noted   Arthritis of left knee 09/07/2024   Osteoarthritis of left knee, unspecified osteoarthritis type 09/07/2024   Osteochondral defect of femoral condyle 05/06/2024   Right knee meniscal tear 10/02/2020   Unilateral primary osteoarthritis, right knee 04/25/2020    Low back pain 03/21/2020   Statin myopathy 11/16/2019   Atrophic vaginitis 06/18/2017   Sprain of calcaneofibular ligament of left ankle 01/16/2017   Obesity (BMI 30.0-34.9) 01/05/2015   CAD S/P percutaneous coronary angioplasty 04/21/2013   Hypercholesterolemia 04/21/2013   Essential hypertension 04/21/2013    PCP: Clarice Nottingham, MD   REFERRING PROVIDER: Harden Jerona GAILS, MD    REFERRING DIAG:  [phone] (ICD-10-CM) - Osteoarthritis of left knee, unspecified osteoarthritis type  Z96.652 (ICD-10-CM) - S/P total knee arthroplasty, left    THERAPY DIAG:  Acute pain of left knee  Localized edema  Difficulty in walking, not elsewhere classified  Muscle weakness (generalized)  Other abnormalities of gait and mobility  Rationale for Evaluation and Treatment: Rehabilitation  ONSET DATE: 09/07/24 Lt TKA  SUBJECTIVE:   SUBJECTIVE STATEMENT: [facility] indicated [facility] with extended walking and up/down elevation.   Pt indicated having challenge with amount of walking.  Reported today was 3rd full day of no hydrocodone  with symptoms noted.   PERTINENT HISTORY: Fracture noted on fibula.   L knee pain started in April.  She had a meniscus surgery in June but pain continued.  Now s/p TKA.  No CPM.  Had 6 HHPT visits.  PAIN:  NPRS scale: upon arrival 3-4/10.  At worst in  last 3-4 days:  8/10 at night.  Pain location: Lt anterior knee Pain description: dull, achy, throbbing Aggravating factors: motion, bending, extending  Relieving factors: medication, ice  PRECAUTIONS: None  RED FLAGS: None   WEIGHT BEARING RESTRICTIONS: No  FALLS:  Has patient fallen in last 6 months? No  LIVING ENVIRONMENT: Lives with: lives with their spouse Lives in: House/apartment Stairs: Yes: External: 2.5 steps; on right going up Has following equipment at home: Vannie - 2 wheeled  OCCUPATION: retired   PLOF: Independent  PATIENT GOALS: decrease pain    NEXT MD VISIT: not listed  OBJECTIVE:  Note: Objective measures were completed at Evaluation unless otherwise noted.  DIAGNOSTIC FINDINGS: MRI 04/12/24  IMPRESSION: Tricompartmental osteoarthrosis. Mild to moderate chondromalacia with moderate reactive joint effusion.   Moderate radial tear at the root of the medial meniscus with medial displacement of body. There is second likely radial tear at the junction the body anterior horn of the medial meniscus. See above for more detail.  PATIENT SURVEYS:  PSFS: THE PATIENT SPECIFIC FUNCTIONAL SCALE  Place score of 0-10 (0 = unable to perform activity and 10 = able to perform activity at the same level as before injury or problem)  Activity Date: 09/22/24 10/20/2024 10/31/2024  2 Steps no railing 0 0 4  2.driving 0 5 9  3.Shower independently 0 8 10  4. Tranfers without hands   1       Total Score 0 4.3 avg 6 avg    Total Score = Sum of activity scores/number of activities  Minimally Detectable Change: 3 points (for single activity); 2 points (for average score)  [facility] (nd). The Patient Specific Functional Scale . Retrieved from [email or web address]   COGNITION: 09/22/2024 Overall cognitive status: Within functional limits for tasks assessed     SENSATION: 09/22/2024 [facility]  EDEMA:  09/22/2024 Circumferential: 55 cm  MUSCLE LENGTH: 09/22/2024 Hamstrings:  Left mod restriction    POSTURE: No Significant postural limitations  PALPATION: 09/22/2024 2+ tenderness at anterior knee   LOWER EXTREMITY ROM:   Right eval Left Eval 09/22/2024 Active/Passive Left  09/29/24 Active/Passive Right 10/11/2024 Left 10/11/2024 Left 10/26/2024  Hip flexion        Hip extension        Hip abduction        Hip adduction        Hip internal rotation        Hip external rotation        Knee flexion  80/84 90/92 124 105 102  Knee extension  +9/+5 +8AA 0 -6 -5   Ankle dorsiflexion        Ankle plantarflexion        Ankle inversion        Ankle eversion         (Blank rows = not tested)  LOWER EXTREMITY MMT:  MMT Right eval Left Eval 09/22/2024 Right 10/31/2024 Left 10/31/2024  Hip flexion  4    Hip extension      Hip abduction  3+    Hip adduction      Hip internal rotation      Hip external rotation      Knee flexion  3    Knee extension  3 38, 37 lbs 28.4, 26 lbs  Ankle dorsiflexion      Ankle plantarflexion      Ankle inversion      Ankle eversion       (Blank rows = not  tested)  LOWER EXTREMITY SPECIAL TESTS:  09/22/2024 S/P no special tests completed  FUNCTIONAL TESTS:  09/22/2024 5 times sit to stand: 32 sec  GAIT: 10/24/2024: Ambulation with SPC in Rt UE, antalgic gait noted with trunk lean away from Lt with reduced stance, TKE, toe press off with Lt leg.   10/17/2024: Ambulation to clinic and in clinic with SPC in Rt UE. Reduced stance on Lt, lacking TKE.   09/22/2024 Distance walked: household Assistive device utilized: Environmental Consultant - 2 wheeled Level of assistance: Complete Independence Comments: good form                      TREATMENT        DATE: 11/14/2024    Therex: Recumbent bike lvl 3 10 mins for ROM, seat 8 Discussed importance of edema control and use of HEP in times of throbbing/symptoms.  Incline gastroc stretch 30 sec x 3 bilaterally   TherActivity (to improve stairs, transfers, ambulation in community) Leg press double leg 87 lbs x 17 slow lowering focus Leg press single leg 43 lbs 2 x 15 bilaterally   Neuro Re-ed Step to pattern over 6 inch hurdle in // bars with occasional HHA 10 ft x 4 leading with each LE Side to side step to pattern over 6 inch hurdle in // bars with occasional HHA 10 ft x 3 each way   Vaso 10 mins Lt leg in elevation low compression 34 deg with wrap shifted as high superiorly as possible.    TREATMENT        DATE: 11/08/2024    TherEx:  Recumbent bike seat 9 level 3 for  10 minutes  Slant board gastroc stretch 3x30s   TherAct:  Step up and over with 4 box and minimal HHA on // bars 2x12 (step up with Lt, step down with Rt) LOB with first rep, though patient attempted to step down with Lt without UE support  Bilat leg press 2x10 with 87#  Unilat leg press 2x10 with 43#, done with bilat LE  Manual:  Seated Lt knee flexion with distraction/IR with PT providing overpressure    TREATMENT        DATE: 11/02/2024    Therex: Recumbent bike seat 9 for ROM, endurance lvl 3 10 mins  Incilne gastroc stretch 30 sec x 3 bilaterally  Knee machine double leg up, Lt leg lowering 5 lb weight x 10 (3 slots from back flexion setup).  Additional time spent in cues for activity/set up as well as education about soreness possibility due to new intervention.    TherActivity: (improve transfers, ambulation, stairs) Leg press double leg 87 lbs slow lowering focus x 15  Leg press single leg 43 lbs 2 x 10 , performed bilaterally   Manual Seated Lt knee flexion c distraction/IR for flexion mobility gains and symptom relief.    TREATMENT        DATE: 10/31/2024    Therex: Recumbent bike seat 9 for ROM, endurance lvl 3 10 mins  Incilne gastroc stretch 30 sec x 5   Manual Seated Lt knee flexion with distraction for mobility gains.    Neuro Re-ed (balance , coordination improvements) 6 inch hurdle step to pattern in // bars with occasional HHA 10 ft x 3 each LE leading (difficulty with Lt knee flexion over hurdle due to ROM) 6 inch hurdle lateral step in // bars with occasional HHA 10 ft x 3 each way   TherActivity: (improve transfers, ambulation,  stairs) Step on over and down Lt leg WB 4 inch step in // bars with occasional HHA x 10  Lateral step down 4 inch step WB on Lt leg x 10 with slow lowering focus (performed bilaterally)  Patient deferred vaso.   PATIENT EDUCATION:  Education details: HEP Person educated: Patient Education method: Programmer, Multimedia,  Demonstration, Actor cues, and Verbal cues Education comprehension: verbalized understanding  HOME EXERCISE PROGRAM: Access Code: [email or web address] URL: [email or web address] Date: 10/11/2024 Prepared by: Lamar Ivory  Exercises - Supine Quad Set  - 2 x daily - 7 x weekly - 2 sets - 10 reps - 3 hold - Supine Active Straight Leg Raise  - 2 x daily - 7 x weekly - 2 sets - 10 reps - Supine Heel Slides  - 2 x daily - 7 x weekly - 2 sets - 10 reps - Heel Raises with Counter Support  - 2 x daily - 7 x weekly - 2 sets - 10 reps - Mini Squat with Counter Support  - 2 x daily - 7 x weekly - 2 sets - 10 reps - Standing March with Counter Support  - 2 x daily - 7 x weekly - 2 sets - 10 reps - Supine Quadricep Sets  - 5 x daily - 7 x weekly - 2 sets - 10 reps - 5 second hold - Seated Straight Leg Raise   - 2 x daily - 7 x weekly - 3-5 sets - 5 reps  ASSESSMENT:  CLINICAL IMPRESSION: Complaints of throbbing type pain noted, specifically with reduced pain medicine.  Discussed at home HEP use and edema reduction techniques to help address as able.  Continued work on functional strengthening and balance improvement in ambulation without pain increase.    OBJECTIVE IMPAIRMENTS: decreased balance, decreased endurance, difficulty walking, decreased ROM, decreased strength, hypomobility, increased edema, impaired flexibility, and pain.   ACTIVITY LIMITATIONS: bending, sitting, standing, squatting, stairs, transfers, bed mobility, bathing, and toileting  PARTICIPATION LIMITATIONS: meal prep, driving, and community activity  PERSONAL FACTORS: --are also affecting patient's functional outcome.   REHAB POTENTIAL: Good  CLINICAL DECISION MAKING: Stable/uncomplicated  EVALUATION COMPLEXITY: Moderate   GOALS: Goals reviewed with patient? Yes  SHORT TERM GOALS: Target date: 10/20/2024 4 weeks    Pt to be independent with HEP. Baseline: Goal status: Met 10/20/2024  2.  Decrease pain by 1  level. Baseline:  Goal status: Met 10/11/2024  3.  Pt to be able to ambulate short community distances with SPC. Baseline: RW Goal status: Met 10/11/2024  LONG TERM GOALS: Target date: 12/15/2024  12 weeks    Pt to be independent with self progressive HEP at discharge.   Baseline:  Goal status: on going 10/31/2024  2.  Decrease pain to max 2/10 with all activities. Baseline:  Goal status: on going 10/31/2024  3.  Increase AROM to 0> 105+L knee.  Goal status: on going 10/31/2024  4.  Increase strength to at least 4>4+/5 in L LE.  Goal status: on going 10/31/2024  5.  Able to ambulate community distances without an AD.  Goal status: on going 10/31/2024  6.  Increase score of PSFS to > 5.  Goal status: met on average 10/31/2024   PLAN:  PT FREQUENCY: 2x/week  PT DURATION: 10 weeks  PLANNED INTERVENTIONS: 97164- PT Re-evaluation, 97110-Therapeutic exercises, 97530- Therapeutic activity, 97112- Neuromuscular re-education, 97535- Self Care, [id number]- Manual therapy, [phone]- Gait training, [phone]- Aquatic Therapy, [phone]- Electrical stimulation (unattended), [phone]- Vasopneumatic device, Patient/Family education, Balance training,  Stair training, Joint mobilization, and Scar mobilization  PLAN FOR NEXT SESSION:   Strengthening as able. Progressive balance improvements.   Be aware of her fibular fracture.   Ozell Silvan, PT, DPT, OCS, ATC 11/14/2024  2:30 PM       Date of referral: 09/08/24 Referring provider: Jerona Sage MD Referring diagnosis?  M17.12 (ICD-10-CM) - Osteoarthritis of left knee, unspecified osteoarthritis type  Z96.652 (ICD-10-CM) - S/P total knee arthroplasty, left   Treatment diagnosis? (if different than referring diagnosis) M25.562  R60.0  R26.2  F37.18M73.10  What was this (referring dx) caused by? Arthritis  Nature of Condition: Chronic (continuous duration > 3 months)   Laterality: Lt  Current Functional Measure Score: Patient Specific  Functional Scale 0  Objective measurements identify impairments when they are compared to normal values, the uninvolved extremity, and prior level of function.  [x]  Yes  []  No  Objective assessment of functional ability: Severe functional limitations   Briefly describe symptoms: pain, decreased ROM, decreased strength  How did symptoms start: Pain in knee started in the spring  Average pain intensity:  Last 24 hours: 6/10  Past week: 9/10  How often does the pt experience symptoms? Constantly  How much have the symptoms interfered with usual daily activities? Extremely  How has condition changed since care began at this facility? NA - initial visit  In general, how is the patients overall health? Good   BACK PAIN (STarT Back Screening Tool) No  "

## 2024-11-15 ENCOUNTER — Encounter: Payer: Self-pay | Admitting: *Deleted

## 2024-11-15 NOTE — Progress Notes (Unsigned)
 KEYUNNA COCO                                          MRN: [id number]   11/15/2024   The [facility] Quality Team Specialist reviewed this patient medical record for the purposes of chart review for care gap closure. The following were reviewed: {CHL AMB [facility] QUALITY SPECIALIST REASON FOR REVIEW:[id number]}.    [facility] Quality Team

## 2024-11-16 ENCOUNTER — Encounter

## 2024-11-18 ENCOUNTER — Ambulatory Visit: Admitting: Rehabilitative and Restorative Service Providers"

## 2024-11-18 ENCOUNTER — Encounter: Payer: Self-pay | Admitting: Rehabilitative and Restorative Service Providers"

## 2024-11-18 DIAGNOSIS — R262 Difficulty in walking, not elsewhere classified: Secondary | ICD-10-CM | POA: Diagnosis not present

## 2024-11-18 DIAGNOSIS — R600 Localized edema: Secondary | ICD-10-CM

## 2024-11-18 DIAGNOSIS — R2689 Other abnormalities of gait and mobility: Secondary | ICD-10-CM | POA: Diagnosis not present

## 2024-11-18 DIAGNOSIS — M6281 Muscle weakness (generalized): Secondary | ICD-10-CM

## 2024-11-18 DIAGNOSIS — M25562 Pain in left knee: Secondary | ICD-10-CM | POA: Diagnosis not present

## 2024-11-18 NOTE — Therapy (Signed)
 " OUTPATIENT PHYSICAL THERAPY TREATMENT   Patient Name: Madison Mosley MRN: [id number] DOB:[birth year], 74 y.o., female Today's Date: 11/18/2024  END OF SESSION:  PT End of Session - 11/18/24 1106     Visit Number 14    Number of Visits 25    Date for Recertification  12/15/24    Authorization Type [employer] / [place]    Authorization Time Period 09/22/2024 - 12/01/2024    Authorization - Visit Number 14    Authorization - Number of Visits 20    Progress Note Due on Visit 20    PT Start Time 1101    PT Stop Time 1141    PT Time Calculation (min) 40 min    Activity Tolerance Patient tolerated treatment well    Behavior During Therapy WFL for tasks assessed/performed                Past Medical History:  Diagnosis Date   Arthritis    CAD (coronary artery disease)    stents   GERD (gastroesophageal reflux disease)    Headache    Hyperlipidemia    Hypertension    Past Surgical History:  Procedure Laterality Date   CARDIAC CATHETERIZATION  11/18/1999   Percutaneous revascularization precedure with angioplasty to the proximal LAD and first diagonal    CESAREAN SECTION     CHOLECYSTECTOMY     CORONARY STENT PLACEMENT     KNEE ARTHROSCOPY WITH MEDIAL MENISECTOMY Left 05/06/2024   Procedure: LEFT KNEE ARTHROSCOPY WITH MEDIAL MENISCECTOMY;  Surgeon: Harden Jerona GAILS, MD;  Location: [facility] OR;  Service: Orthopedics;  Laterality: Left;   PARTIAL HYSTERECTOMY     TONSILLECTOMY  11/17/1958   TOTAL KNEE ARTHROPLASTY Left 09/07/2024   Procedure: ARTHROPLASTY, KNEE, TOTAL LEFT;  Surgeon: Harden Jerona GAILS, MD;  Location: [facility] OR;  Service: Orthopedics;  Laterality: Left;   Patient Active Problem List   Diagnosis Date Noted   Arthritis of left knee 09/07/2024   Osteoarthritis of left knee, unspecified osteoarthritis type 09/07/2024   Osteochondral defect of femoral condyle 05/06/2024   Right knee meniscal tear 10/02/2020   Unilateral primary osteoarthritis, right knee 04/25/2020    Low back pain 03/21/2020   Statin myopathy 11/16/2019   Atrophic vaginitis 06/18/2017   Sprain of calcaneofibular ligament of left ankle 01/16/2017   Obesity (BMI 30.0-34.9) 01/05/2015   CAD S/P percutaneous coronary angioplasty 04/21/2013   Hypercholesterolemia 04/21/2013   Essential hypertension 04/21/2013    PCP: Clarice Nottingham, MD   REFERRING PROVIDER: Harden Jerona GAILS, MD    REFERRING DIAG:  [phone] (ICD-10-CM) - Osteoarthritis of left knee, unspecified osteoarthritis type  Z96.652 (ICD-10-CM) - S/P total knee arthroplasty, left    THERAPY DIAG:  Acute pain of left knee  Localized edema  Difficulty in walking, not elsewhere classified  Muscle weakness (generalized)  Other abnormalities of gait and mobility  Rationale for Evaluation and Treatment: Rehabilitation  ONSET DATE: 09/07/24 Lt TKA  SUBJECTIVE:   SUBJECTIVE STATEMENT: Pt indicated having some intense knee symptom at night.   PERTINENT HISTORY: Fracture noted on fibula.   L knee pain started in April.  She had a meniscus surgery in June but pain continued.  Now s/p TKA.  No CPM.  Had 6 HHPT visits.  PAIN:  NPRS scale: upon arrival : 3/10 Pain location: Lt anterior knee Pain description: dull, achy, throbbing Aggravating factors: motion, bending, extending  Relieving factors: medication, ice  PRECAUTIONS: None  RED FLAGS: None   WEIGHT BEARING RESTRICTIONS:  No  FALLS:  Has patient fallen in last 6 months? No  LIVING ENVIRONMENT: Lives with: lives with their spouse Lives in: House/apartment Stairs: Yes: External: 2.5 steps; on right going up Has following equipment at home: Vannie - 2 wheeled  OCCUPATION: retired   PLOF: Independent  PATIENT GOALS: decrease pain   NEXT MD VISIT: not listed  OBJECTIVE:  Note: Objective measures were completed at Evaluation unless otherwise noted.  DIAGNOSTIC FINDINGS: MRI 04/12/24  IMPRESSION: Tricompartmental osteoarthrosis. Mild to moderate  chondromalacia with moderate reactive joint effusion.   Moderate radial tear at the root of the medial meniscus with medial displacement of body. There is second likely radial tear at the junction the body anterior horn of the medial meniscus. See above for more detail.  PATIENT SURVEYS:  PSFS: THE PATIENT SPECIFIC FUNCTIONAL SCALE  Place score of 0-10 (0 = unable to perform activity and 10 = able to perform activity at the same level as before injury or problem)  Activity Date: 09/22/24 10/20/2024 10/31/2024  2 Steps no railing 0 0 4  2.driving 0 5 9  3.Shower independently 0 8 10  4. Tranfers without hands   1       Total Score 0 4.3 avg 6 avg    Total Score = Sum of activity scores/number of activities  Minimally Detectable Change: 3 points (for single activity); 2 points (for average score)  [facility] (nd). The Patient Specific Functional Scale . Retrieved from [email or web address]   COGNITION: 09/22/2024 Overall cognitive status: Within functional limits for tasks assessed     SENSATION: 09/22/2024 [facility]  EDEMA:  09/22/2024 Circumferential: 55 cm  MUSCLE LENGTH: 09/22/2024 Hamstrings:  Left mod restriction    POSTURE: No Significant postural limitations  PALPATION: 09/22/2024 2+ tenderness at anterior knee   LOWER EXTREMITY ROM:   Right eval Left Eval 09/22/2024 Active/Passive Left  09/29/24 Active/Passive Right 10/11/2024 Left 10/11/2024 Left 10/26/2024 Left 11/18/2024  Hip flexion         Hip extension         Hip abduction         Hip adduction         Hip internal rotation         Hip external rotation         Knee flexion  80/84 90/92 124 105 102 108 AROM in supine heel slide   Knee extension  +9/+5 +8AA 0 -6 -5 -3 in supine heel prop.   Ankle dorsiflexion         Ankle plantarflexion         Ankle inversion         Ankle eversion          (Blank rows = not tested)  LOWER  EXTREMITY MMT:  MMT Right eval Left Eval 09/22/2024 Right 10/31/2024 Left 10/31/2024 Left 11/18/2024   Hip flexion  4     Hip extension       Hip abduction  3+     Hip adduction       Hip internal rotation       Hip external rotation       Knee flexion  3     Knee extension  3 38, 37 lbs 28.4, 26 lbs 33, 33.9 lbs  Ankle dorsiflexion       Ankle plantarflexion       Ankle inversion       Ankle eversion        (Blank rows =  not tested)  LOWER EXTREMITY SPECIAL TESTS:  09/22/2024 S/P no special tests completed  FUNCTIONAL TESTS:  09/22/2024 5 times sit to stand: 32 sec  GAIT: 10/24/2024: Ambulation with SPC in Rt UE, antalgic gait noted with trunk lean away from Lt with reduced stance, TKE, toe press off with Lt leg.   10/17/2024: Ambulation to clinic and in clinic with SPC in Rt UE. Reduced stance on Lt, lacking TKE.   09/22/2024 Distance walked: household Assistive device utilized: Environmental Consultant - 2 wheeled Level of assistance: Complete Independence Comments: good form                      TREATMENT        DATE: 11/18/2024    Therex: Recumbent bike lvl 3 10 mins for ROM, seat 8 Incilne gastroc stretch 30 sec x 5 bilaterally  Supine heel slide AROM 10 sec x 5 Supine heel prop 1 min     TherActivity (to improve stairs, transfers, ambulation in community) Leg press double leg 87 lbs x 17 slow lowering focus Leg press single leg 43 lbs 2 x 15 bilaterally  Step on over and down 4 inch step WB on Lt leg x 10 with single hand assist.   Self Care Cross friction massage education for incision mobility.    TREATMENT        DATE: 11/14/2024    Therex: Recumbent bike lvl 3 10 mins for ROM, seat 8 Discussed importance of edema control and use of HEP in times of throbbing/symptoms.  Incline gastroc stretch 30 sec x 3 bilaterally   TherActivity (to improve stairs, transfers, ambulation in community) Leg press double leg 87 lbs x 17 slow lowering focus Leg press single leg 43 lbs  2 x 15 bilaterally   Neuro Re-ed Step to pattern over 6 inch hurdle in // bars with occasional HHA 10 ft x 4 leading with each LE Side to side step to pattern over 6 inch hurdle in // bars with occasional HHA 10 ft x 3 each way   Vaso 10 mins Lt leg in elevation low compression 34 deg with wrap shifted as high superiorly as possible.    TREATMENT        DATE: 11/08/2024    TherEx:  Recumbent bike seat 9 level 3 for 10 minutes  Slant board gastroc stretch 3x30s   TherAct:  Step up and over with 4 box and minimal HHA on // bars 2x12 (step up with Lt, step down with Rt) LOB with first rep, though patient attempted to step down with Lt without UE support  Bilat leg press 2x10 with 87#  Unilat leg press 2x10 with 43#, done with bilat LE  Manual:  Seated Lt knee flexion with distraction/IR with PT providing overpressure    TREATMENT        DATE: 11/02/2024    Therex: Recumbent bike seat 9 for ROM, endurance lvl 3 10 mins  Incilne gastroc stretch 30 sec x 3 bilaterally  Knee machine double leg up, Lt leg lowering 5 lb weight x 10 (3 slots from back flexion setup).  Additional time spent in cues for activity/set up as well as education about soreness possibility due to new intervention.    TherActivity: (improve transfers, ambulation, stairs) Leg press double leg 87 lbs slow lowering focus x 15  Leg press single leg 43 lbs 2 x 10 , performed bilaterally   Manual Seated Lt knee flexion c distraction/IR for flexion mobility  gains and symptom relief.    PATIENT EDUCATION:  Education details: HEP Person educated: Patient Education method: Programmer, Multimedia, Demonstration, Actor cues, and Verbal cues Education comprehension: verbalized understanding  HOME EXERCISE PROGRAM: Access Code: [email or web address] URL: [email or web address] Date: 10/11/2024 Prepared by: Lamar Ivory  Exercises - Supine Quad Set  - 2 x daily - 7 x weekly - 2 sets - 10 reps - 3 hold - Supine Active  Straight Leg Raise  - 2 x daily - 7 x weekly - 2 sets - 10 reps - Supine Heel Slides  - 2 x daily - 7 x weekly - 2 sets - 10 reps - Heel Raises with Counter Support  - 2 x daily - 7 x weekly - 2 sets - 10 reps - Mini Squat with Counter Support  - 2 x daily - 7 x weekly - 2 sets - 10 reps - Standing March with Counter Support  - 2 x daily - 7 x weekly - 2 sets - 10 reps - Supine Quadricep Sets  - 5 x daily - 7 x weekly - 2 sets - 10 reps - 5 second hold - Seated Straight Leg Raise   - 2 x daily - 7 x weekly - 3-5 sets - 5 reps  ASSESSMENT:  CLINICAL IMPRESSION: Continued symptom complaint similar but making some gains in ambulation without SPC.  Step activity still limited in eccentric lowering.   Mild gains in strength, mobility noted in reassessment today.    OBJECTIVE IMPAIRMENTS: decreased balance, decreased endurance, difficulty walking, decreased ROM, decreased strength, hypomobility, increased edema, impaired flexibility, and pain.   ACTIVITY LIMITATIONS: bending, sitting, standing, squatting, stairs, transfers, bed mobility, bathing, and toileting  PARTICIPATION LIMITATIONS: meal prep, driving, and community activity  PERSONAL FACTORS: --are also affecting patient's functional outcome.   REHAB POTENTIAL: Good  CLINICAL DECISION MAKING: Stable/uncomplicated  EVALUATION COMPLEXITY: Moderate   GOALS: Goals reviewed with patient? Yes  SHORT TERM GOALS: Target date: 10/20/2024 4 weeks    Pt to be independent with HEP. Baseline: Goal status: Met 10/20/2024  2.  Decrease pain by 1 level. Baseline:  Goal status: Met 10/11/2024  3.  Pt to be able to ambulate short community distances with SPC. Baseline: RW Goal status: Met 10/11/2024  LONG TERM GOALS: Target date: 12/15/2024  12 weeks    Pt to be independent with self progressive HEP at discharge.   Baseline:  Goal status: on going 10/31/2024  2.  Decrease pain to max 2/10 with all activities. Baseline:  Goal  status: on going 10/31/2024  3.  Increase AROM to 0> 105+L knee.  Goal status: on going 10/31/2024  4.  Increase strength to at least 4>4+/5 in L LE.  Goal status: on going 10/31/2024  5.  Able to ambulate community distances without an AD.  Goal status: on going 10/31/2024  6.  Increase score of PSFS to > 5.  Goal status: met on average 10/31/2024   PLAN:  PT FREQUENCY: 2x/week  PT DURATION: 10 weeks  PLANNED INTERVENTIONS: 97164- PT Re-evaluation, 97110-Therapeutic exercises, 97530- Therapeutic activity, 97112- Neuromuscular re-education, 97535- Self Care, 02859- Manual therapy, [phone]- Gait training, [phone]- Aquatic Therapy, [phone]- Electrical stimulation (unattended), 97016- Vasopneumatic device, Patient/Family education, Balance training, Stair training, Joint mobilization, and Scar mobilization  PLAN FOR NEXT SESSION:   Strengthening as able in WB.  Continued end range gains.    Be aware of her fibular fracture.   Ozell Silvan, PT, DPT, OCS, ATC 11/18/2024  11:44 AM  Date of referral: 09/08/24 Referring provider: Jerona Sage MD Referring diagnosis?  M17.12 (ICD-10-CM) - Osteoarthritis of left knee, unspecified osteoarthritis type  Z96.652 (ICD-10-CM) - S/P total knee arthroplasty, left   Treatment diagnosis? (if different than referring diagnosis) M25.562  R60.0  R26.2  F37.18M73.10  What was this (referring dx) caused by? Arthritis  Nature of Condition: Chronic (continuous duration > 3 months)   Laterality: Lt  Current Functional Measure Score: Patient Specific Functional Scale 0  Objective measurements identify impairments when they are compared to normal values, the uninvolved extremity, and prior level of function.  [x]  Yes  []  No  Objective assessment of functional ability: Severe functional limitations   Briefly describe symptoms: pain, decreased ROM, decreased strength  How did symptoms start: Pain in knee started in the spring  Average  pain intensity:  Last 24 hours: 6/10  Past week: 9/10  How often does the pt experience symptoms? Constantly  How much have the symptoms interfered with usual daily activities? Extremely  How has condition changed since care began at this facility? NA - initial visit  In general, how is the patients overall health? Good   BACK PAIN (STarT Back Screening Tool) No  "

## 2024-11-21 ENCOUNTER — Encounter: Payer: Self-pay | Admitting: Rehabilitative and Restorative Service Providers"

## 2024-11-21 ENCOUNTER — Ambulatory Visit (INDEPENDENT_AMBULATORY_CARE_PROVIDER_SITE_OTHER): Admitting: Rehabilitative and Restorative Service Providers"

## 2024-11-21 DIAGNOSIS — R2689 Other abnormalities of gait and mobility: Secondary | ICD-10-CM | POA: Diagnosis not present

## 2024-11-21 DIAGNOSIS — M6281 Muscle weakness (generalized): Secondary | ICD-10-CM

## 2024-11-21 DIAGNOSIS — M25562 Pain in left knee: Secondary | ICD-10-CM

## 2024-11-21 DIAGNOSIS — R262 Difficulty in walking, not elsewhere classified: Secondary | ICD-10-CM | POA: Diagnosis not present

## 2024-11-21 DIAGNOSIS — R600 Localized edema: Secondary | ICD-10-CM

## 2024-11-21 NOTE — Therapy (Signed)
 " OUTPATIENT PHYSICAL THERAPY TREATMENT   Patient Name: Madison Mosley MRN: [id number] DOB:Sep 09, 1951, 74 y.o., female Today's Date: 11/21/2024  END OF SESSION:  PT End of Session - 11/21/24 1353     Visit Number 15    Number of Visits 25    Date for Recertification  12/15/24    Authorization Type [employer] / [place]    Authorization Time Period 09/22/2024 - 12/01/2024    Authorization - Visit Number 15    Authorization - Number of Visits 20    Progress Note Due on Visit 20    PT Start Time 1344    PT Stop Time 1424    PT Time Calculation (min) 40 min    Activity Tolerance Patient tolerated treatment well    Behavior During Therapy WFL for tasks assessed/performed                 Past Medical History:  Diagnosis Date   Arthritis    CAD (coronary artery disease)    stents   GERD (gastroesophageal reflux disease)    Headache    Hyperlipidemia    Hypertension    Past Surgical History:  Procedure Laterality Date   CARDIAC CATHETERIZATION  11/18/1999   Percutaneous revascularization precedure with angioplasty to the proximal LAD and first diagonal    CESAREAN SECTION     CHOLECYSTECTOMY     CORONARY STENT PLACEMENT     KNEE ARTHROSCOPY WITH MEDIAL MENISECTOMY Left 05/06/2024   Procedure: LEFT KNEE ARTHROSCOPY WITH MEDIAL MENISCECTOMY;  Surgeon: Harden Jerona GAILS, MD;  Location: [facility] OR;  Service: Orthopedics;  Laterality: Left;   PARTIAL HYSTERECTOMY     TONSILLECTOMY  11/17/1958   TOTAL KNEE ARTHROPLASTY Left 09/07/2024   Procedure: ARTHROPLASTY, KNEE, TOTAL LEFT;  Surgeon: Harden Jerona GAILS, MD;  Location: [facility] OR;  Service: Orthopedics;  Laterality: Left;   Patient Active Problem List   Diagnosis Date Noted   Arthritis of left knee 09/07/2024   Osteoarthritis of left knee, unspecified osteoarthritis type 09/07/2024   Osteochondral defect of femoral condyle 05/06/2024   Right knee meniscal tear 10/02/2020   Unilateral primary osteoarthritis, right knee  04/25/2020   Low back pain 03/21/2020   Statin myopathy 11/16/2019   Atrophic vaginitis 06/18/2017   Sprain of calcaneofibular ligament of left ankle 01/16/2017   Obesity (BMI 30.0-34.9) 01/05/2015   CAD S/P percutaneous coronary angioplasty 04/21/2013   Hypercholesterolemia 04/21/2013   Essential hypertension 04/21/2013    PCP: Clarice Nottingham, MD   REFERRING PROVIDER: Harden Jerona GAILS, MD    REFERRING DIAG:  [phone] (ICD-10-CM) - Osteoarthritis of left knee, unspecified osteoarthritis type  Z96.652 (ICD-10-CM) - S/P total knee arthroplasty, left    THERAPY DIAG:  Acute pain of left knee  Localized edema  Difficulty in walking, not elsewhere classified  Muscle weakness (generalized)  Other abnormalities of gait and mobility  Rationale for Evaluation and Treatment: Rehabilitation  ONSET DATE: 09/07/24 Lt TKA  SUBJECTIVE:   SUBJECTIVE STATEMENT: Pt indicated having 2/10 pain today with OTC medicine.  Reported difficulty with prolonged standing at church.   PERTINENT HISTORY: Fracture noted on fibula.   L knee pain started in April.  She had a meniscus surgery in June but pain continued.  Now s/p TKA.  No CPM.  Had 6 HHPT visits.  PAIN:  NPRS scale: upon arrival : 2/10 Pain location: Lt anterior knee Pain description: dull, achy, throbbing Aggravating factors: motion, bending, extending  Relieving factors: medication, ice  PRECAUTIONS: None  RED FLAGS: None   WEIGHT BEARING RESTRICTIONS: No  FALLS:  Has patient fallen in last 6 months? No  LIVING ENVIRONMENT: Lives with: lives with their spouse Lives in: House/apartment Stairs: Yes: External: 2.5 steps; on right going up Has following equipment at home: Vannie - 2 wheeled  OCCUPATION: retired   PLOF: Independent  PATIENT GOALS: decrease pain   NEXT MD VISIT: not listed  OBJECTIVE:  Note: Objective measures were completed at Evaluation unless otherwise noted.  DIAGNOSTIC FINDINGS: MRI 04/12/24   IMPRESSION: Tricompartmental osteoarthrosis. Mild to moderate chondromalacia with moderate reactive joint effusion.   Moderate radial tear at the root of the medial meniscus with medial displacement of body. There is second likely radial tear at the junction the body anterior horn of the medial meniscus. See above for more detail.  PATIENT SURVEYS:  PSFS: THE PATIENT SPECIFIC FUNCTIONAL SCALE  Place score of 0-10 (0 = unable to perform activity and 10 = able to perform activity at the same level as before injury or problem)  Activity Date: 09/22/24 10/20/2024 10/31/2024  2 Steps no railing 0 0 4  2.driving 0 5 9  3.Shower independently 0 8 10  4. Tranfers without hands   1       Total Score 0 4.3 avg 6 avg    Total Score = Sum of activity scores/number of activities  Minimally Detectable Change: 3 points (for single activity); 2 points (for average score)  [facility] (nd). The Patient Specific Functional Scale . Retrieved from [email or web address]   COGNITION: 09/22/2024 Overall cognitive status: Within functional limits for tasks assessed     SENSATION: 09/22/2024 [facility]  EDEMA:  09/22/2024 Circumferential: 55 cm  MUSCLE LENGTH: 09/22/2024 Hamstrings:  Left mod restriction    POSTURE: No Significant postural limitations  PALPATION: 09/22/2024 2+ tenderness at anterior knee   LOWER EXTREMITY ROM:   Right eval Left Eval 09/22/2024 Active/Passive Left  09/29/24 Active/Passive Right 10/11/2024 Left 10/11/2024 Left 10/26/2024 Left 11/18/2024  Hip flexion         Hip extension         Hip abduction         Hip adduction         Hip internal rotation         Hip external rotation         Knee flexion  80/84 90/92 124 105 102 108 AROM in supine heel slide   Knee extension  +9/+5 +8AA 0 -6 -5 -3 in supine heel prop.   Ankle dorsiflexion         Ankle plantarflexion         Ankle inversion          Ankle eversion          (Blank rows = not tested)  LOWER EXTREMITY MMT:  MMT Right eval Left Eval 09/22/2024 Right 10/31/2024 Left 10/31/2024 Left 11/18/2024   Hip flexion  4     Hip extension       Hip abduction  3+     Hip adduction       Hip internal rotation       Hip external rotation       Knee flexion  3     Knee extension  3 38, 37 lbs 28.4, 26 lbs 33, 33.9 lbs  Ankle dorsiflexion       Ankle plantarflexion       Ankle inversion       Ankle eversion        (  Blank rows = not tested)  LOWER EXTREMITY SPECIAL TESTS:  09/22/2024 S/P no special tests completed  FUNCTIONAL TESTS:  09/22/2024 5 times sit to stand: 32 sec  GAIT: 10/24/2024: Ambulation with SPC in Rt UE, antalgic gait noted with trunk lean away from Lt with reduced stance, TKE, toe press off with Lt leg.   10/17/2024: Ambulation to clinic and in clinic with SPC in Rt UE. Reduced stance on Lt, lacking TKE.   09/22/2024 Distance walked: household Assistive device utilized: [employer] - 2 wheeled Level of assistance: Complete Independence Comments: good form                      TREATMENT        DATE: 11/21/2024    Therex: UBE LE only seat 10 lvl 1.0 (machine resistance out).  10 mins for ROM, endurance Incline gastroc stretch 30 sec x 5  Knee extension machine double leg up, Lt leg lowering slowly (some assistance in Rt at times) 5 lbs x 10 (setting 3 slot max flexion).  Knee flexion machine Lt leg only 10 lbs x 10  Seated quad set with SLR Lt 3 x 5   Manual Lt knee flexion distraction with IR mobilization with movement.     TREATMENT        DATE: 11/18/2024    Therex: Recumbent bike lvl 3 10 mins for ROM, seat 8 Incilne gastroc stretch 30 sec x 5 bilaterally  Supine heel slide AROM 10 sec x 5 Supine heel prop 1 min     TherActivity (to improve stairs, transfers, ambulation in community) Leg press double leg 87 lbs x 17 slow lowering focus Leg press single leg 43 lbs 2 x 15 bilaterally  Step on  over and down 4 inch step WB on Lt leg x 10 with single hand assist.   Self Care Cross friction massage education for incision mobility.    TREATMENT        DATE: 11/14/2024    Therex: Recumbent bike lvl 3 10 mins for ROM, seat 8 Discussed importance of edema control and use of HEP in times of throbbing/symptoms.  Incline gastroc stretch 30 sec x 3 bilaterally   TherActivity (to improve stairs, transfers, ambulation in community) Leg press double leg 87 lbs x 17 slow lowering focus Leg press single leg 43 lbs 2 x 15 bilaterally   Neuro Re-ed Step to pattern over 6 inch hurdle in // bars with occasional HHA 10 ft x 4 leading with each LE Side to side step to pattern over 6 inch hurdle in // bars with occasional HHA 10 ft x 3 each way   Vaso 10 mins Lt leg in elevation low compression 34 deg with wrap shifted as high superiorly as possible.    TREATMENT        DATE: 11/08/2024    TherEx:  Recumbent bike seat 9 level 3 for 10 minutes  Slant board gastroc stretch 3x30s   TherAct:  Step up and over with 4 box and minimal HHA on // bars 2x12 (step up with Lt, step down with Rt) LOB with first rep, though patient attempted to step down with Lt without UE support  Bilat leg press 2x10 with 87#  Unilat leg press 2x10 with 43#, done with bilat LE  Manual:  Seated Lt knee flexion with distraction/IR with PT providing overpressure     PATIENT EDUCATION:  Education details: HEP Person educated: Patient Education method: Explanation, Demonstration, Actor  cues, and Verbal cues Education comprehension: verbalized understanding  HOME EXERCISE PROGRAM: Access Code: [email or web address] URL: [email or web address] Date: 10/11/2024 Prepared by: Lamar Ivory  Exercises - Supine Quad Set  - 2 x daily - 7 x weekly - 2 sets - 10 reps - 3 hold - Supine Active Straight Leg Raise  - 2 x daily - 7 x weekly - 2 sets - 10 reps - Supine Heel Slides  - 2 x daily - 7 x weekly - 2 sets - 10  reps - Heel Raises with Counter Support  - 2 x daily - 7 x weekly - 2 sets - 10 reps - Mini Squat with Counter Support  - 2 x daily - 7 x weekly - 2 sets - 10 reps - Standing March with Counter Support  - 2 x daily - 7 x weekly - 2 sets - 10 reps - Supine Quadricep Sets  - 5 x daily - 7 x weekly - 2 sets - 10 reps - 5 second hold - Seated Straight Leg Raise   - 2 x daily - 7 x weekly - 3-5 sets - 5 reps  ASSESSMENT:  CLINICAL IMPRESSION: Discussed progressive standing tolerance duration increases as able with cues for mobility intervention to reduce complaints as able.   Continued focus on strengthening gains to improve WB activity.    OBJECTIVE IMPAIRMENTS: decreased balance, decreased endurance, difficulty walking, decreased ROM, decreased strength, hypomobility, increased edema, impaired flexibility, and pain.   ACTIVITY LIMITATIONS: bending, sitting, standing, squatting, stairs, transfers, bed mobility, bathing, and toileting  PARTICIPATION LIMITATIONS: meal prep, driving, and community activity  PERSONAL FACTORS: --are also affecting patient's functional outcome.   REHAB POTENTIAL: Good  CLINICAL DECISION MAKING: Stable/uncomplicated  EVALUATION COMPLEXITY: Moderate   GOALS: Goals reviewed with patient? Yes  SHORT TERM GOALS: Target date: 10/20/2024 4 weeks    Pt to be independent with HEP. Baseline: Goal status: Met 10/20/2024  2.  Decrease pain by 1 level. Baseline:  Goal status: Met 10/11/2024  3.  Pt to be able to ambulate short community distances with SPC. Baseline: RW Goal status: Met 10/11/2024  LONG TERM GOALS: Target date: 12/15/2024  12 weeks    Pt to be independent with self progressive HEP at discharge.   Baseline:  Goal status: on going 10/31/2024  2.  Decrease pain to max 2/10 with all activities. Baseline:  Goal status: on going 10/31/2024  3.  Increase AROM to 0> 105+L knee.  Goal status: on going 10/31/2024  4.  Increase strength to at  least 4>4+/5 in L LE.  Goal status: on going 10/31/2024  5.  Able to ambulate community distances without an AD.  Goal status: on going 10/31/2024  6.  Increase score of PSFS to > 5.  Goal status: met on average 10/31/2024   PLAN:  PT FREQUENCY: 2x/week  PT DURATION: 10 weeks  PLANNED INTERVENTIONS: 97164- PT Re-evaluation, 97110-Therapeutic exercises, 97530- Therapeutic activity, 97112- Neuromuscular re-education, 97535- Self Care, [id number]- Manual therapy, [phone]- Gait training, [phone]- Aquatic Therapy, [phone]- Electrical stimulation (unattended), 97016- Vasopneumatic device, Patient/Family education, Balance training, Stair training, Joint mobilization, and Scar mobilization  PLAN FOR NEXT SESSION:   Check on knee extension machine response.   Be aware of her fibular fracture.   Ozell Silvan, PT, DPT, OCS, ATC 11/21/2024  3:00 PM       Date of referral: 09/08/24 Referring provider: Jerona Sage MD Referring diagnosis?  M17.12 (ICD-10-CM) - Osteoarthritis of left knee, unspecified osteoarthritis type  S03.347 (  ICD-10-CM) - S/P total knee arthroplasty, left   Treatment diagnosis? (if different than referring diagnosis) M25.562  R60.0  R26.2  F37.18M73.10  What was this (referring dx) caused by? Arthritis  Nature of Condition: Chronic (continuous duration > 3 months)   Laterality: Lt  Current Functional Measure Score: Patient Specific Functional Scale 0  Objective measurements identify impairments when they are compared to normal values, the uninvolved extremity, and prior level of function.  [x]  Yes  []  No  Objective assessment of functional ability: Severe functional limitations   Briefly describe symptoms: pain, decreased ROM, decreased strength  How did symptoms start: Pain in knee started in the spring  Average pain intensity:  Last 24 hours: 6/10  Past week: 9/10  How often does the pt experience symptoms? Constantly  How much have the symptoms  interfered with usual daily activities? Extremely  How has condition changed since care began at this facility? NA - initial visit  In general, how is the patients overall health? Good   BACK PAIN (STarT Back Screening Tool) No  "

## 2024-11-24 ENCOUNTER — Ambulatory Visit (INDEPENDENT_AMBULATORY_CARE_PROVIDER_SITE_OTHER): Admitting: Rehabilitative and Restorative Service Providers"

## 2024-11-24 ENCOUNTER — Encounter: Payer: Self-pay | Admitting: Rehabilitative and Restorative Service Providers"

## 2024-11-24 DIAGNOSIS — M6281 Muscle weakness (generalized): Secondary | ICD-10-CM | POA: Diagnosis not present

## 2024-11-24 DIAGNOSIS — M25562 Pain in left knee: Secondary | ICD-10-CM

## 2024-11-24 DIAGNOSIS — R2689 Other abnormalities of gait and mobility: Secondary | ICD-10-CM

## 2024-11-24 DIAGNOSIS — R600 Localized edema: Secondary | ICD-10-CM

## 2024-11-24 DIAGNOSIS — R262 Difficulty in walking, not elsewhere classified: Secondary | ICD-10-CM | POA: Diagnosis not present

## 2024-11-24 NOTE — Therapy (Signed)
 " OUTPATIENT PHYSICAL THERAPY TREATMENT   Patient Name: Madison Mosley MRN: [id number] DOB:[birth year], 74 y.o., female Today's Date: 11/24/2024  END OF SESSION:  PT End of Session - 11/24/24 1347     Visit Number 16    Number of Visits 25    Date for Recertification  12/15/24    Authorization Type [employer] / [place]    Authorization Time Period 09/22/2024 - 12/01/2024    Authorization - Visit Number 16    Authorization - Number of Visits 20    Progress Note Due on Visit 20    PT Start Time 1343    PT Stop Time 1423    PT Time Calculation (min) 40 min    Activity Tolerance Patient tolerated treatment well    Behavior During Therapy WFL for tasks assessed/performed                  Past Medical History:  Diagnosis Date   Arthritis    CAD (coronary artery disease)    stents   GERD (gastroesophageal reflux disease)    Headache    Hyperlipidemia    Hypertension    Past Surgical History:  Procedure Laterality Date   CARDIAC CATHETERIZATION  11/18/1999   Percutaneous revascularization precedure with angioplasty to the proximal LAD and first diagonal    CESAREAN SECTION     CHOLECYSTECTOMY     CORONARY STENT PLACEMENT     KNEE ARTHROSCOPY WITH MEDIAL MENISECTOMY Left 05/06/2024   Procedure: LEFT KNEE ARTHROSCOPY WITH MEDIAL MENISCECTOMY;  Surgeon: Harden Jerona GAILS, MD;  Location: [facility] OR;  Service: Orthopedics;  Laterality: Left;   PARTIAL HYSTERECTOMY     TONSILLECTOMY  11/17/1958   TOTAL KNEE ARTHROPLASTY Left 09/07/2024   Procedure: ARTHROPLASTY, KNEE, TOTAL LEFT;  Surgeon: Harden Jerona GAILS, MD;  Location: [facility] OR;  Service: Orthopedics;  Laterality: Left;   Patient Active Problem List   Diagnosis Date Noted   Arthritis of left knee 09/07/2024   Osteoarthritis of left knee, unspecified osteoarthritis type 09/07/2024   Osteochondral defect of femoral condyle 05/06/2024   Right knee meniscal tear 10/02/2020   Unilateral primary osteoarthritis, right knee  04/25/2020   Low back pain 03/21/2020   Statin myopathy 11/16/2019   Atrophic vaginitis 06/18/2017   Sprain of calcaneofibular ligament of left ankle 01/16/2017   Obesity (BMI 30.0-34.9) 01/05/2015   CAD S/P percutaneous coronary angioplasty 04/21/2013   Hypercholesterolemia 04/21/2013   Essential hypertension 04/21/2013    PCP: Clarice Nottingham, MD   REFERRING PROVIDER: Harden Jerona GAILS, MD    REFERRING DIAG:  [phone] (ICD-10-CM) - Osteoarthritis of left knee, unspecified osteoarthritis type  Z96.652 (ICD-10-CM) - S/P total knee arthroplasty, left    THERAPY DIAG:  Acute pain of left knee  Localized edema  Difficulty in walking, not elsewhere classified  Muscle weakness (generalized)  Other abnormalities of gait and mobility  Rationale for Evaluation and Treatment: Rehabilitation  ONSET DATE: 09/07/24 Lt TKA  SUBJECTIVE:   SUBJECTIVE STATEMENT: Pt indicated having soreness after last visit in muscles and knee.  Reported walking on road with some hills with using walking sticks in both hands.    PERTINENT HISTORY: Fracture noted on fibula.   L knee pain started in April.  She had a meniscus surgery in June but pain continued.  Now s/p TKA.  No CPM.  Had 6 HHPT visits.  PAIN:  NPRS scale: upon arrival : 3/10 Pain location: Lt anterior knee Pain description: dull, achy, throbbing Aggravating factors:  motion, bending, extending  Relieving factors: medication, ice  PRECAUTIONS: None  RED FLAGS: None   WEIGHT BEARING RESTRICTIONS: No  FALLS:  Has patient fallen in last 6 months? No  LIVING ENVIRONMENT: Lives with: lives with their spouse Lives in: House/apartment Stairs: Yes: External: 2.5 steps; on right going up Has following equipment at home: Vannie - 2 wheeled  OCCUPATION: retired   PLOF: Independent  PATIENT GOALS: decrease pain   NEXT MD VISIT: not listed  OBJECTIVE:  Note: Objective measures were completed at Evaluation unless otherwise  noted.  DIAGNOSTIC FINDINGS: MRI 04/12/24  IMPRESSION: Tricompartmental osteoarthrosis. Mild to moderate chondromalacia with moderate reactive joint effusion.   Moderate radial tear at the root of the medial meniscus with medial displacement of body. There is second likely radial tear at the junction the body anterior horn of the medial meniscus. See above for more detail.  PATIENT SURVEYS:  PSFS: THE PATIENT SPECIFIC FUNCTIONAL SCALE  Place score of 0-10 (0 = unable to perform activity and 10 = able to perform activity at the same level as before injury or problem)  Activity Date: 09/22/24 10/20/2024 10/31/2024  2 Steps no railing 0 0 4  2.driving 0 5 9  3.Shower independently 0 8 10  4. Tranfers without hands   1       Total Score 0 4.3 avg 6 avg    Total Score = Sum of activity scores/number of activities  Minimally Detectable Change: 3 points (for single activity); 2 points (for average score)  [facility] (nd). The Patient Specific Functional Scale . Retrieved from [email or web address]   COGNITION: 09/22/2024 Overall cognitive status: Within functional limits for tasks assessed     SENSATION: 09/22/2024 [facility]  EDEMA:  09/22/2024 Circumferential: 55 cm  MUSCLE LENGTH: 09/22/2024 Hamstrings:  Left mod restriction    POSTURE: No Significant postural limitations  PALPATION: 09/22/2024 2+ tenderness at anterior knee   LOWER EXTREMITY ROM:   Right eval Left Eval 09/22/2024 Active/Passive Left  09/29/24 Active/Passive Right 10/11/2024 Left 10/11/2024 Left 10/26/2024 Left 11/18/2024  Hip flexion         Hip extension         Hip abduction         Hip adduction         Hip internal rotation         Hip external rotation         Knee flexion  80/84 90/92 124 105 102 108 AROM in supine heel slide   Knee extension  +9/+5 +8AA 0 -6 -5 -3 in supine heel prop.   Ankle dorsiflexion         Ankle  plantarflexion         Ankle inversion         Ankle eversion          (Blank rows = not tested)  LOWER EXTREMITY MMT:  MMT Right eval Left Eval 09/22/2024 Right 10/31/2024 Left 10/31/2024 Left 11/18/2024   Hip flexion  4     Hip extension       Hip abduction  3+     Hip adduction       Hip internal rotation       Hip external rotation       Knee flexion  3     Knee extension  3 38, 37 lbs 28.4, 26 lbs 33, 33.9 lbs  Ankle dorsiflexion       Ankle plantarflexion  Ankle inversion       Ankle eversion        (Blank rows = not tested)  LOWER EXTREMITY SPECIAL TESTS:  09/22/2024 S/P no special tests completed  FUNCTIONAL TESTS:  09/22/2024 5 times sit to stand: 32 sec  GAIT: 10/24/2024: Ambulation with SPC in Rt UE, antalgic gait noted with trunk lean away from Lt with reduced stance, TKE, toe press off with Lt leg.   10/17/2024: Ambulation to clinic and in clinic with SPC in Rt UE. Reduced stance on Lt, lacking TKE.   09/22/2024 Distance walked: household Assistive device utilized: Environmental Consultant - 2 wheeled Level of assistance: Complete Independence Comments: good form                     TREATMENT        DATE: 11/24/2024    Therex: UBE LE only seat 10 lvl 1.0 (machine resistance out).  10 mins for ROM, endurance Knee extension machine double leg up, Lt leg lowering slowly (some assistance in Rt at times) 5 lbs x 15(setting 3 slot max flexion). Knee flexion machine Lt leg only 10 lbs x 15 Seated quad set with SLR Lt 2-3 sec hold in 2-4 inch lift x 15  TherActivity (to improve stairs, transfers, squatting) Sit to stand to sit 18 inch chair with foam pad UE assist x 10 Lateral step up/down WB on Lt leg 4 inch step x 15     TREATMENT        DATE: 11/21/2024    Therex: UBE LE only seat 10 lvl 1.0 (machine resistance out).  10 mins for ROM, endurance Incline gastroc stretch 30 sec x 5  Knee extension machine double leg up, Lt leg lowering slowly (some assistance in Rt at  times) 5 lbs x 10 (setting 3 slot max flexion).  Knee flexion machine Lt leg only 10 lbs x 10  Seated quad set with SLR Lt 3 x 5   Manual Lt knee flexion distraction with IR mobilization with movement.     TREATMENT        DATE: 11/18/2024    Therex: Recumbent bike lvl 3 10 mins for ROM, seat 8 Incilne gastroc stretch 30 sec x 5 bilaterally  Supine heel slide AROM 10 sec x 5 Supine heel prop 1 min     TherActivity (to improve stairs, transfers, ambulation in community) Leg press double leg 87 lbs x 17 slow lowering focus Leg press single leg 43 lbs 2 x 15 bilaterally  Step on over and down 4 inch step WB on Lt leg x 10 with single hand assist.   Self Care Cross friction massage education for incision mobility.    TREATMENT        DATE: 11/14/2024    Therex: Recumbent bike lvl 3 10 mins for ROM, seat 8 Discussed importance of edema control and use of HEP in times of throbbing/symptoms.  Incline gastroc stretch 30 sec x 3 bilaterally   TherActivity (to improve stairs, transfers, ambulation in community) Leg press double leg 87 lbs x 17 slow lowering focus Leg press single leg 43 lbs 2 x 15 bilaterally   Neuro Re-ed Step to pattern over 6 inch hurdle in // bars with occasional HHA 10 ft x 4 leading with each LE Side to side step to pattern over 6 inch hurdle in // bars with occasional HHA 10 ft x 3 each way   Vaso 10 mins Lt leg in elevation low compression  34 deg with wrap shifted as high superiorly as possible.     PATIENT EDUCATION:  Education details: HEP Person educated: Patient Education method: Programmer, Multimedia, Demonstration, Actor cues, and Verbal cues Education comprehension: verbalized understanding  HOME EXERCISE PROGRAM: Access Code: [email or web address] URL: [email or web address] Date: 10/11/2024 Prepared by: Lamar Ivory  Exercises - Supine Quad Set  - 2 x daily - 7 x weekly - 2 sets - 10 reps - 3 hold - Supine Active Straight Leg Raise  - 2 x  daily - 7 x weekly - 2 sets - 10 reps - Supine Heel Slides  - 2 x daily - 7 x weekly - 2 sets - 10 reps - Heel Raises with Counter Support  - 2 x daily - 7 x weekly - 2 sets - 10 reps - Mini Squat with Counter Support  - 2 x daily - 7 x weekly - 2 sets - 10 reps - Standing March with Counter Support  - 2 x daily - 7 x weekly - 2 sets - 10 reps - Supine Quadricep Sets  - 5 x daily - 7 x weekly - 2 sets - 10 reps - 5 second hold - Seated Straight Leg Raise   - 2 x daily - 7 x weekly - 3-5 sets - 5 reps  ASSESSMENT:  CLINICAL IMPRESSION: Medical necessity for continued skilled PT services present at this time.  Muscle weakness and end range tightness still impacting functional movements.    OBJECTIVE IMPAIRMENTS: decreased balance, decreased endurance, difficulty walking, decreased ROM, decreased strength, hypomobility, increased edema, impaired flexibility, and pain.   ACTIVITY LIMITATIONS: bending, sitting, standing, squatting, stairs, transfers, bed mobility, bathing, and toileting  PARTICIPATION LIMITATIONS: meal prep, driving, and community activity  PERSONAL FACTORS: --are also affecting patient's functional outcome.   REHAB POTENTIAL: Good  CLINICAL DECISION MAKING: Stable/uncomplicated  EVALUATION COMPLEXITY: Moderate   GOALS: Goals reviewed with patient? Yes  SHORT TERM GOALS: Target date: 10/20/2024 4 weeks    Pt to be independent with HEP. Baseline: Goal status: Met 10/20/2024  2.  Decrease pain by 1 level. Baseline:  Goal status: Met 10/11/2024  3.  Pt to be able to ambulate short community distances with SPC. Baseline: RW Goal status: Met 10/11/2024  LONG TERM GOALS: Target date: 12/15/2024  12 weeks    Pt to be independent with self progressive HEP at discharge.   Baseline:  Goal status: on going 10/31/2024  2.  Decrease pain to max 2/10 with all activities. Baseline:  Goal status: on going 10/31/2024  3.  Increase AROM to 0> 105+L knee.  Goal  status: on going 10/31/2024  4.  Increase strength to at least 4>4+/5 in L LE.  Goal status: on going 10/31/2024  5.  Able to ambulate community distances without an AD.  Goal status: on going 10/31/2024  6.  Increase score of PSFS to > 5.  Goal status: met on average 10/31/2024   PLAN:  PT FREQUENCY: 2x/week  PT DURATION: 10 weeks  PLANNED INTERVENTIONS: 97164- PT Re-evaluation, 97110-Therapeutic exercises, 97530- Therapeutic activity, 97112- Neuromuscular re-education, 97535- Self Care, [id number]- Manual therapy, [phone]- Gait training, [phone]- Aquatic Therapy, [phone]- Electrical stimulation (unattended), 97016- Vasopneumatic device, Patient/Family education, Balance training, Stair training, Joint mobilization, and Scar mobilization  PLAN FOR NEXT SESSION:   [employer] approval date ends 12/01/2024.  Will need more approval at that time.   Be aware of her fibular fracture.   Ozell Silvan, PT, DPT, OCS, ATC 11/24/2024  2:21 PM  Date of referral: 09/08/24 Referring provider: Jerona Sage MD Referring diagnosis?  M17.12 (ICD-10-CM) - Osteoarthritis of left knee, unspecified osteoarthritis type  Z96.652 (ICD-10-CM) - S/P total knee arthroplasty, left   Treatment diagnosis? (if different than referring diagnosis) M25.562  R60.0  R26.2  F37.18M73.10  What was this (referring dx) caused by? Arthritis  Nature of Condition: Chronic (continuous duration > 3 months)   Laterality: Lt  Current Functional Measure Score: Patient Specific Functional Scale 0  Objective measurements identify impairments when they are compared to normal values, the uninvolved extremity, and prior level of function.  [x]  Yes  []  No  Objective assessment of functional ability: Severe functional limitations   Briefly describe symptoms: pain, decreased ROM, decreased strength  How did symptoms start: Pain in knee started in the spring  Average pain intensity:  Last 24 hours: 6/10  Past week:  9/10  How often does the pt experience symptoms? Constantly  How much have the symptoms interfered with usual daily activities? Extremely  How has condition changed since care began at this facility? NA - initial visit  In general, how is the patients overall health? Good   BACK PAIN (STarT Back Screening Tool) No  "

## 2024-11-28 ENCOUNTER — Ambulatory Visit: Admitting: Orthopedic Surgery

## 2024-11-28 ENCOUNTER — Encounter: Payer: Self-pay | Admitting: Orthopedic Surgery

## 2024-11-28 ENCOUNTER — Other Ambulatory Visit

## 2024-11-28 DIAGNOSIS — Z96652 Presence of left artificial knee joint: Secondary | ICD-10-CM | POA: Diagnosis not present

## 2024-11-28 NOTE — Therapy (Signed)
 " OUTPATIENT PHYSICAL THERAPY TREATMENT   Patient Name: Madison Mosley MRN: [id number] DOB:[birth year], 74 y.o., female Today's Date: 11/29/2024  END OF SESSION:  PT End of Session - 11/29/24 1349     Visit Number 17    Number of Visits 25    Date for Recertification  12/15/24    Authorization Type [employer] Medicare / [place]    Authorization Time Period 09/22/2024 - 12/01/2024    Authorization - Number of Visits 20    Progress Note Due on Visit 20    PT Start Time 1349    PT Stop Time 1427    PT Time Calculation (min) 38 min    Activity Tolerance Patient tolerated treatment well    Behavior During Therapy WFL for tasks assessed/performed                   Past Medical History:  Diagnosis Date   Arthritis    CAD (coronary artery disease)    stents   GERD (gastroesophageal reflux disease)    Headache    Hyperlipidemia    Hypertension    Past Surgical History:  Procedure Laterality Date   CARDIAC CATHETERIZATION  11/18/1999   Percutaneous revascularization precedure with angioplasty to the proximal LAD and first diagonal    CESAREAN SECTION     CHOLECYSTECTOMY     CORONARY STENT PLACEMENT     KNEE ARTHROSCOPY WITH MEDIAL MENISECTOMY Left 05/06/2024   Procedure: LEFT KNEE ARTHROSCOPY WITH MEDIAL MENISCECTOMY;  Surgeon: Harden Jerona GAILS, MD;  Location: [facility] OR;  Service: Orthopedics;  Laterality: Left;   PARTIAL HYSTERECTOMY     TONSILLECTOMY  11/17/1958   TOTAL KNEE ARTHROPLASTY Left 09/07/2024   Procedure: ARTHROPLASTY, KNEE, TOTAL LEFT;  Surgeon: Harden Jerona GAILS, MD;  Location: [facility] OR;  Service: Orthopedics;  Laterality: Left;   Patient Active Problem List   Diagnosis Date Noted   Arthritis of left knee 09/07/2024   Osteoarthritis of left knee, unspecified osteoarthritis type 09/07/2024   Osteochondral defect of femoral condyle 05/06/2024   Right knee meniscal tear 10/02/2020   Unilateral primary osteoarthritis, right knee 04/25/2020   Low back pain 03/21/2020    Statin myopathy 11/16/2019   Atrophic vaginitis 06/18/2017   Sprain of calcaneofibular ligament of left ankle 01/16/2017   Obesity (BMI 30.0-34.9) 01/05/2015   CAD S/P percutaneous coronary angioplasty 04/21/2013   Hypercholesterolemia 04/21/2013   Essential hypertension 04/21/2013    PCP: Clarice Nottingham, MD   REFERRING PROVIDER: Harden Jerona GAILS, MD    REFERRING DIAG:  [phone] (ICD-10-CM) - Osteoarthritis of left knee, unspecified osteoarthritis type  Z96.652 (ICD-10-CM) - S/P total knee arthroplasty, left    THERAPY DIAG:  Acute pain of left knee  Localized edema  Difficulty in walking, not elsewhere classified  Muscle weakness (generalized)  Other abnormalities of gait and mobility  Rationale for Evaluation and Treatment: Rehabilitation  ONSET DATE: 09/07/24 Lt TKA  SUBJECTIVE:   SUBJECTIVE STATEMENT: Patient reports that she is still having some issues with sit<>stands and step ups/downs.  PERTINENT HISTORY: Fracture noted on fibula.   L knee pain started in April.  She had a meniscus surgery in June but pain continued.  Now s/p TKA.  No CPM.  Had 6 HHPT visits.  PAIN:  NPRS scale: upon arrival : 2/10 Pain location: Lt anterior knee Pain description: dull, achy, throbbing Aggravating factors: motion, bending, extending  Relieving factors: medication, ice  PRECAUTIONS: None  RED FLAGS: None   WEIGHT BEARING RESTRICTIONS: No  FALLS:  Has patient fallen in last 6 months? No  LIVING ENVIRONMENT: Lives with: lives with their spouse Lives in: House/apartment Stairs: Yes: External: 2.5 steps; on right going up Has following equipment at home: Vannie - 2 wheeled  OCCUPATION: retired   PLOF: Independent  PATIENT GOALS: decrease pain   NEXT MD VISIT: not listed  OBJECTIVE:  Note: Objective measures were completed at Evaluation unless otherwise noted.  DIAGNOSTIC FINDINGS: MRI 04/12/24  IMPRESSION: Tricompartmental osteoarthrosis. Mild to  moderate chondromalacia with moderate reactive joint effusion.   Moderate radial tear at the root of the medial meniscus with medial displacement of body. There is second likely radial tear at the junction the body anterior horn of the medial meniscus. See above for more detail.  PATIENT SURVEYS:  PSFS: THE PATIENT SPECIFIC FUNCTIONAL SCALE  Place score of 0-10 (0 = unable to perform activity and 10 = able to perform activity at the same level as before injury or problem)  Activity Date: 09/22/24 10/20/2024 10/31/2024  2 Steps no railing 0 0 4  2.driving 0 5 9  3.Shower independently 0 8 10  4. Tranfers without hands   1       Total Score 0 4.3 avg 6 avg    Total Score = Sum of activity scores/number of activities  Minimally Detectable Change: 3 points (for single activity); 2 points (for average score)  [facility] (nd). The Patient Specific Functional Scale . Retrieved from [email or web address]   COGNITION: 09/22/2024 Overall cognitive status: Within functional limits for tasks assessed     SENSATION: 09/22/2024 [facility]  EDEMA:  09/22/2024 Circumferential: 55 cm  MUSCLE LENGTH: 09/22/2024 Hamstrings:  Left mod restriction    POSTURE: No Significant postural limitations  PALPATION: 09/22/2024 2+ tenderness at anterior knee   LOWER EXTREMITY ROM:   Right eval Left Eval 09/22/2024 Active/Passive Left  09/29/24 Active/Passive Right 10/11/2024 Left 10/11/2024 Left 10/26/2024 Left 11/18/2024  Hip flexion         Hip extension         Hip abduction         Hip adduction         Hip internal rotation         Hip external rotation         Knee flexion  80/84 90/92 124 105 102 108 AROM in supine heel slide   Knee extension  +9/+5 +8AA 0 -6 -5 -3 in supine heel prop.   Ankle dorsiflexion         Ankle plantarflexion         Ankle inversion         Ankle eversion          (Blank rows = not  tested)  LOWER EXTREMITY MMT:  MMT Right eval Left Eval 09/22/2024 Right 10/31/2024 Left 10/31/2024 Left 11/18/2024   Hip flexion  4     Hip extension       Hip abduction  3+     Hip adduction       Hip internal rotation       Hip external rotation       Knee flexion  3     Knee extension  3 38, 37 lbs 28.4, 26 lbs 33, 33.9 lbs  Ankle dorsiflexion       Ankle plantarflexion       Ankle inversion       Ankle eversion        (Blank rows = not  tested)  LOWER EXTREMITY SPECIAL TESTS:  09/22/2024 S/P no special tests completed  FUNCTIONAL TESTS:  09/22/2024 5 times sit to stand: 32 sec  GAIT: 10/24/2024: Ambulation with SPC in Rt UE, antalgic gait noted with trunk lean away from Lt with reduced stance, TKE, toe press off with Lt leg.   10/17/2024: Ambulation to clinic and in clinic with SPC in Rt UE. Reduced stance on Lt, lacking TKE.   09/22/2024 Distance walked: household Assistive device utilized: Environmental Consultant - 2 wheeled Level of assistance: Complete Independence Comments: good form                     TREATMENT        DATE: 11/29/2024    TherEx:  Recumbent bike level 3 for 10 minutes  Knee extension machine bilat up, unilat down 1x7, 1x6 Slant board gastroc stretch 3x45s   TherAct:  Sit<>stand from 18 chair no UE use 2x5 Step up and over with slight UE use on // bars using 6 step 1x15    TREATMENT        DATE: 11/24/2024    Therex: UBE LE only seat 10 lvl 1.0 (machine resistance out).  10 mins for ROM, endurance Knee extension machine double leg up, Lt leg lowering slowly (some assistance in Rt at times) 5 lbs x 15(setting 3 slot max flexion). Knee flexion machine Lt leg only 10 lbs x 15 Seated quad set with SLR Lt 2-3 sec hold in 2-4 inch lift x 15  TherActivity (to improve stairs, transfers, squatting) Sit to stand to sit 18 inch chair with foam pad UE assist x 10 Lateral step up/down WB on Lt leg 4 inch step x 15     TREATMENT        DATE: 11/21/2024     Therex: UBE LE only seat 10 lvl 1.0 (machine resistance out).  10 mins for ROM, endurance Incline gastroc stretch 30 sec x 5  Knee extension machine double leg up, Lt leg lowering slowly (some assistance in Rt at times) 5 lbs x 10 (setting 3 slot max flexion).  Knee flexion machine Lt leg only 10 lbs x 10  Seated quad set with SLR Lt 3 x 5   Manual Lt knee flexion distraction with IR mobilization with movement.     TREATMENT        DATE: 11/18/2024    Therex: Recumbent bike lvl 3 10 mins for ROM, seat 8 Incilne gastroc stretch 30 sec x 5 bilaterally  Supine heel slide AROM 10 sec x 5 Supine heel prop 1 min     TherActivity (to improve stairs, transfers, ambulation in community) Leg press double leg 87 lbs x 17 slow lowering focus Leg press single leg 43 lbs 2 x 15 bilaterally  Step on over and down 4 inch step WB on Lt leg x 10 with single hand assist.   Self Care Cross friction massage education for incision mobility.    TREATMENT        DATE: 11/14/2024    Therex: Recumbent bike lvl 3 10 mins for ROM, seat 8 Discussed importance of edema control and use of HEP in times of throbbing/symptoms.  Incline gastroc stretch 30 sec x 3 bilaterally   TherActivity (to improve stairs, transfers, ambulation in community) Leg press double leg 87 lbs x 17 slow lowering focus Leg press single leg 43 lbs 2 x 15 bilaterally   Neuro Re-ed Step to pattern over 6 inch hurdle in //  bars with occasional HHA 10 ft x 4 leading with each LE Side to side step to pattern over 6 inch hurdle in // bars with occasional HHA 10 ft x 3 each way   Vaso 10 mins Lt leg in elevation low compression 34 deg with wrap shifted as high superiorly as possible.     PATIENT EDUCATION:  Education details: HEP Person educated: Patient Education method: Programmer, Multimedia, Demonstration, Actor cues, and Verbal cues Education comprehension: verbalized understanding  HOME EXERCISE PROGRAM: Access Code:  [email or web address] URL: [email or web address] Date: 10/11/2024 Prepared by: Lamar Ivory  Exercises - Supine Quad Set  - 2 x daily - 7 x weekly - 2 sets - 10 reps - 3 hold - Supine Active Straight Leg Raise  - 2 x daily - 7 x weekly - 2 sets - 10 reps - Supine Heel Slides  - 2 x daily - 7 x weekly - 2 sets - 10 reps - Heel Raises with Counter Support  - 2 x daily - 7 x weekly - 2 sets - 10 reps - Mini Squat with Counter Support  - 2 x daily - 7 x weekly - 2 sets - 10 reps - Standing March with Counter Support  - 2 x daily - 7 x weekly - 2 sets - 10 reps - Supine Quadricep Sets  - 5 x daily - 7 x weekly - 2 sets - 10 reps - 5 second hold - Seated Straight Leg Raise   - 2 x daily - 7 x weekly - 3-5 sets - 5 reps  ASSESSMENT:  CLINICAL IMPRESSION: Patient arrived to session noting continued soreness and difficulty with functional abilities. Patient tolerated all activities with increased challenges this date, though still has deficits that warrant increase PT. Patient will continue to benefit from skilled PT.   OBJECTIVE IMPAIRMENTS: decreased balance, decreased endurance, difficulty walking, decreased ROM, decreased strength, hypomobility, increased edema, impaired flexibility, and pain.   ACTIVITY LIMITATIONS: bending, sitting, standing, squatting, stairs, transfers, bed mobility, bathing, and toileting  PARTICIPATION LIMITATIONS: meal prep, driving, and community activity  PERSONAL FACTORS: --are also affecting patient's functional outcome.   REHAB POTENTIAL: Good  CLINICAL DECISION MAKING: Stable/uncomplicated  EVALUATION COMPLEXITY: Moderate   GOALS: Goals reviewed with patient? Yes  SHORT TERM GOALS: Target date: 10/20/2024 4 weeks    Pt to be independent with HEP. Baseline: Goal status: Met 10/20/2024  2.  Decrease pain by 1 level. Baseline:  Goal status: Met 10/11/2024  3.  Pt to be able to ambulate short community distances with SPC. Baseline: RW Goal  status: Met 10/11/2024  LONG TERM GOALS: Target date: 12/15/2024  12 weeks    Pt to be independent with self progressive HEP at discharge.   Baseline:  Goal status: on going 10/31/2024  2.  Decrease pain to max 2/10 with all activities. Baseline:  Goal status: on going 10/31/2024  3.  Increase AROM to 0> 105+L knee.  Goal status: on going 10/31/2024  4.  Increase strength to at least 4>4+/5 in L LE.  Goal status: on going 10/31/2024  5.  Able to ambulate community distances without an AD.  Goal status: on going 10/31/2024  6.  Increase score of PSFS to > 5.  Goal status: met on average 10/31/2024   PLAN:  PT FREQUENCY: 2x/week  PT DURATION: 10 weeks  PLANNED INTERVENTIONS: 97164- PT Re-evaluation, 97110-Therapeutic exercises, 97530- Therapeutic activity, 97112- Neuromuscular re-education, 97535- Self Care, 02859- Manual therapy, [id number]- Gait training, [phone]- Aquatic Therapy, [phone]-  Electrical stimulation (unattended), 97016- Vasopneumatic device, Patient/Family education, Balance training, Stair training, Joint mobilization, and Scar mobilization  PLAN FOR NEXT SESSION:   [employer] approval date ends 12/01/2024.  Will need more approval at that time.   Be aware of her fibular fracture.   Susannah Daring, PT, DPT 11/29/2024 2:32 PM         Date of referral: 09/08/24 Referring provider: Jerona Sage MD Referring diagnosis?  M17.12 (ICD-10-CM) - Osteoarthritis of left knee, unspecified osteoarthritis type  Z96.652 (ICD-10-CM) - S/P total knee arthroplasty, left   Treatment diagnosis? (if different than referring diagnosis) M25.562  R60.0  R26.2  F37.18M73.10  What was this (referring dx) caused by? Arthritis  Nature of Condition: Chronic (continuous duration > 3 months)   Laterality: Lt  Current Functional Measure Score: Patient Specific Functional Scale 0  Objective measurements identify impairments when they are compared to normal values, the uninvolved extremity,  and prior level of function.  [x]  Yes  []  No  Objective assessment of functional ability: Severe functional limitations   Briefly describe symptoms: pain, decreased ROM, decreased strength  How did symptoms start: Pain in knee started in the spring  Average pain intensity:  Last 24 hours: 6/10  Past week: 9/10  How often does the pt experience symptoms? Constantly  How much have the symptoms interfered with usual daily activities? Extremely  How has condition changed since care began at this facility? NA - initial visit  In general, how is the patients overall health? Good   BACK PAIN (STarT Back Screening Tool) No  "

## 2024-11-28 NOTE — Progress Notes (Signed)
 "  Office Visit Note   Patient: Madison Mosley           Date of Birth: 1951-10-29           MRN: [id number] Visit Date: 11/28/2024              Requested by: Clarice Nottingham, MD [address] [place],  [place] [address] PCP: Clarice Nottingham, MD  Chief Complaint  Patient presents with   Left Knee - Routine Post Op    09/07/2024 left knee replacement       HPI: Discussed the use of AI scribe software for clinical note transcription with the patient, who gave verbal consent to proceed.  History of Present Illness Madison Mosley is a 74 year old female three months status post left total knee arthroplasty who presents for routine postoperative follow-up of the left knee.  Three months following left total knee arthroplasty, she ambulates with a cane and has participated in physical therapy since the immediate postoperative period. She continues therapy twice weekly and is interested in extending sessions if insurance permits. She is working to improve knee flexion and extension, but ongoing mild swelling limits full flexion.  She has recently incorporated leg weights and leg lifts into her exercise regimen and performs walking and recumbent cycling at home. She is not a member of a gym but has access to a recumbent bike and is encouraged to pursue additional strengthening activities.  Pain is well controlled; she discontinued hydrocodone  two and a half weeks ago and currently uses Tylenol  and Advil intermittently. She avoids regular Advil use due to a pre-existing heart condition and takes three extra strength Advil as needed.  She has a prior left fibula fracture, now completely healed. Radiographs of the left knee were performed and the prior left fibula fracture is now completely healed.     Assessment & Plan: Visit Diagnoses:  1. S/P total knee arthroplasty, left     Plan: Assessment and Plan Assessment & Plan Status post left total knee  arthroplasty Three months post-surgery, she shows good progress with congruent joint on radiographs and complete fibula fracture healing. Ambulates with cane, knee ROM 0-105 degrees, mild swelling limits full flexion. Discontinued opioids, uses acetaminophen  and ibuprofen intermittently. Anticipated continued improvement with therapy and strengthening. - Continue physical therapy as insurance allows; request extension if possible. - Encouraged strength training with leg weights and gym equipment. - Continue stretching to improve knee extension and flexion as swelling decreases. - Join gym or [employer]neakers for strengthening and aerobic exercise. - Continue walking and recumbent bike for cardiovascular health. - Reassured regarding radiographic findings and fibula healing. - Follow up as needed.      Follow-Up Instructions: Return if symptoms worsen or fail to improve.   Ortho Exam  Patient is alert, oriented, no adenopathy, well-dressed, normal affect, normal respiratory effort. Physical Exam MUSCULOSKELETAL: Left knee range of motion 0-105 degrees.      Imaging: XR Knee 1-2 Views Left Result Date: 11/28/2024 2 view radiographs of the left knee shows a stable total knee arthroplasty.  The stress fracture of the proximal fibula has healed with good callus formation.  The joint is congruent.  No images are attached to the encounter.  Labs: No results found for: HGBA1C, ESRSEDRATE, CRP, LABURIC, REPTSTATUS, GRAMSTAIN, CULT, LABORGA   Lab Results  Component Value Date   ALBUMIN 3.2 (L) 05/06/2024    No results found for: MG No results found for: [facility]  No results found for: PREALBUMIN    Latest Ref Rng & Units 09/08/2024    3:49 AM 09/01/2024    9:46 AM 05/06/2024    7:10 AM  CBC EXTENDED  WBC 4.0 - 10.5 K/uL 14.1  8.2  8.2   RBC 3.87 - 5.11 MIL/uL 3.45  4.10  4.01   Hemoglobin 12.0 - 15.0 g/dL 89.8  88.0  87.8   HCT 36.0 - 46.0 % 31.6  37.4   37.9   Platelets 150 - 400 K/uL 278  246  250   NEUT# 1.7 - 7.7 K/uL   5.3   Lymph# 0.7 - 4.0 K/uL   2.2      There is no height or weight on file to calculate BMI.  Orders:  Orders Placed This Encounter  Procedures   XR Knee 1-2 Views Left   No orders of the defined types were placed in this encounter.    Procedures: No procedures performed  Clinical Data: No additional findings.  ROS:  All other systems negative, except as noted in the HPI. Review of Systems  Objective: Vital Signs: There were no vitals taken for this visit.  Specialty Comments:  No specialty comments available.  PMFS History: Patient Active Problem List   Diagnosis Date Noted   Arthritis of left knee 09/07/2024   Osteoarthritis of left knee, unspecified osteoarthritis type 09/07/2024   Osteochondral defect of femoral condyle 05/06/2024   Right knee meniscal tear 10/02/2020   Unilateral primary osteoarthritis, right knee 04/25/2020   Low back pain 03/21/2020   Statin myopathy 11/16/2019   Atrophic vaginitis 06/18/2017   Sprain of calcaneofibular ligament of left ankle 01/16/2017   Obesity (BMI 30.0-34.9) 01/05/2015   CAD S/P percutaneous coronary angioplasty 04/21/2013   Hypercholesterolemia 04/21/2013   Essential hypertension 04/21/2013   Past Medical History:  Diagnosis Date   Arthritis    CAD (coronary artery disease)    stents   GERD (gastroesophageal reflux disease)    Headache    Hyperlipidemia    Hypertension     Family History  Problem Relation Age of Onset   COPD Mother    Heart failure Mother    Diabetes Mother    Valvular heart disease Mother        mitral valve leakage   Heart attack Father 4       multiple heart attacks   Heart attack Sister 17   CAD Brother 88   Heart attack Maternal Grandfather    Heart attack Paternal Grandfather        multiple hearts attacks   Stroke Paternal Grandfather     Past Surgical History:  Procedure Laterality Date   CARDIAC  CATHETERIZATION  11/18/1999   Percutaneous revascularization precedure with angioplasty to the proximal LAD and first diagonal    CESAREAN SECTION     CHOLECYSTECTOMY     CORONARY STENT PLACEMENT     KNEE ARTHROSCOPY WITH MEDIAL MENISECTOMY Left 05/06/2024   Procedure: LEFT KNEE ARTHROSCOPY WITH MEDIAL MENISCECTOMY;  Surgeon: Harden Jerona GAILS, MD;  Location: [facility] OR;  Service: Orthopedics;  Laterality: Left;   PARTIAL HYSTERECTOMY     TONSILLECTOMY  11/17/1958   TOTAL KNEE ARTHROPLASTY Left 09/07/2024   Procedure: ARTHROPLASTY, KNEE, TOTAL LEFT;  Surgeon: Harden Jerona GAILS, MD;  Location: [facility] OR;  Service: Orthopedics;  Laterality: Left;   Social History   Occupational History   Not on file  Tobacco Use   Smoking status: Never   Smokeless tobacco: Never  Vaping Use   Vaping status: Never Used  Substance and Sexual Activity   Alcohol use: No    Alcohol/week: 0.0 standard drinks of alcohol   Drug use: No   Sexual activity: Not Currently         "

## 2024-11-29 ENCOUNTER — Ambulatory Visit

## 2024-11-29 DIAGNOSIS — M6281 Muscle weakness (generalized): Secondary | ICD-10-CM | POA: Diagnosis not present

## 2024-11-29 DIAGNOSIS — R262 Difficulty in walking, not elsewhere classified: Secondary | ICD-10-CM | POA: Diagnosis not present

## 2024-11-29 DIAGNOSIS — R600 Localized edema: Secondary | ICD-10-CM

## 2024-11-29 DIAGNOSIS — R2689 Other abnormalities of gait and mobility: Secondary | ICD-10-CM | POA: Diagnosis not present

## 2024-11-29 DIAGNOSIS — M25562 Pain in left knee: Secondary | ICD-10-CM

## 2024-12-01 NOTE — Therapy (Signed)
 " OUTPATIENT PHYSICAL THERAPY TREATMENT / RECERTIFICATION   Patient Name: Madison Mosley MRN: [id number] DOB:05-08-1951, 74 y.o., female Today's Date: 12/01/2024  END OF SESSION:             Past Medical History:  Diagnosis Date   Arthritis    CAD (coronary artery disease)    stents   GERD (gastroesophageal reflux disease)    Headache    Hyperlipidemia    Hypertension    Past Surgical History:  Procedure Laterality Date   CARDIAC CATHETERIZATION  11/18/1999   Percutaneous revascularization precedure with angioplasty to the proximal LAD and first diagonal    CESAREAN SECTION     CHOLECYSTECTOMY     CORONARY STENT PLACEMENT     KNEE ARTHROSCOPY WITH MEDIAL MENISECTOMY Left 05/06/2024   Procedure: LEFT KNEE ARTHROSCOPY WITH MEDIAL MENISCECTOMY;  Surgeon: Harden Jerona GAILS, MD;  Location: [facility] OR;  Service: Orthopedics;  Laterality: Left;   PARTIAL HYSTERECTOMY     TONSILLECTOMY  11/17/1958   TOTAL KNEE ARTHROPLASTY Left 09/07/2024   Procedure: ARTHROPLASTY, KNEE, TOTAL LEFT;  Surgeon: Harden Jerona GAILS, MD;  Location: [facility] OR;  Service: Orthopedics;  Laterality: Left;   Patient Active Problem List   Diagnosis Date Noted   Arthritis of left knee 09/07/2024   Osteoarthritis of left knee, unspecified osteoarthritis type 09/07/2024   Osteochondral defect of femoral condyle 05/06/2024   Right knee meniscal tear 10/02/2020   Unilateral primary osteoarthritis, right knee 04/25/2020   Low back pain 03/21/2020   Statin myopathy 11/16/2019   Atrophic vaginitis 06/18/2017   Sprain of calcaneofibular ligament of left ankle 01/16/2017   Obesity (BMI 30.0-34.9) 01/05/2015   CAD S/P percutaneous coronary angioplasty 04/21/2013   Hypercholesterolemia 04/21/2013   Essential hypertension 04/21/2013    PCP: Clarice Nottingham, MD   REFERRING PROVIDER: Harden Jerona GAILS, MD    REFERRING DIAG:  [phone] (ICD-10-CM) - Osteoarthritis of left knee, unspecified osteoarthritis type  Z96.652  (ICD-10-CM) - S/P total knee arthroplasty, left    THERAPY DIAG:  No diagnosis found.  Rationale for Evaluation and Treatment: Rehabilitation  ONSET DATE: 09/07/24 Lt TKA  SUBJECTIVE:   SUBJECTIVE STATEMENT: *** Patient reports that she is still having some issues with sit<>stands and step ups/downs.  PERTINENT HISTORY: Fracture noted on fibula.   L knee pain started in April.  She had a meniscus surgery in June but pain continued.  Now s/p TKA.  No CPM.  Had 6 HHPT visits.  PAIN:  NPRS scale: upon arrival : 2/10 Pain location: Lt anterior knee Pain description: dull, achy, throbbing Aggravating factors: motion, bending, extending  Relieving factors: medication, ice  PRECAUTIONS: None  RED FLAGS: None   WEIGHT BEARING RESTRICTIONS: No  FALLS:  Has patient fallen in last 6 months? No  LIVING ENVIRONMENT: Lives with: lives with their spouse Lives in: House/apartment Stairs: Yes: External: 2.5 steps; on right going up Has following equipment at home: Vannie - 2 wheeled  OCCUPATION: retired   PLOF: Independent  PATIENT GOALS: decrease pain   NEXT MD VISIT: not listed  OBJECTIVE:  Note: Objective measures were completed at Evaluation unless otherwise noted.  DIAGNOSTIC FINDINGS: MRI 04/12/24  IMPRESSION: Tricompartmental osteoarthrosis. Mild to moderate chondromalacia with moderate reactive joint effusion.   Moderate radial tear at the root of the medial meniscus with medial displacement of body. There is second likely radial tear at the junction the body anterior horn of the medial meniscus. See above for more detail.  PATIENT SURVEYS:  PSFS:  THE PATIENT SPECIFIC FUNCTIONAL SCALE  Place score of 0-10 (0 = unable to perform activity and 10 = able to perform activity at the same level as before injury or problem)  Activity Date: 09/22/24 10/20/2024 10/31/2024  2 Steps no railing 0 0 4  2.driving 0 5 9  3.Shower independently 0 8 10  4. Tranfers without  hands   1       Total Score 0 4.3 avg 6 avg    Total Score = Sum of activity scores/number of activities  Minimally Detectable Change: 3 points (for single activity); 2 points (for average score)  [facility] (nd). The Patient Specific Functional Scale . Retrieved from [email or web address]   COGNITION: 09/22/2024 Overall cognitive status: Within functional limits for tasks assessed     SENSATION: 09/22/2024 [facility]  EDEMA:  09/22/2024 Circumferential: 55 cm  MUSCLE LENGTH: 09/22/2024 Hamstrings:  Left mod restriction    POSTURE: No Significant postural limitations  PALPATION: 09/22/2024 2+ tenderness at anterior knee   LOWER EXTREMITY ROM:   Right eval Left Eval 09/22/2024 Active/Passive Left  09/29/24 Active/Passive Right 10/11/2024 Left 10/11/2024 Left 10/26/2024 Left 11/18/2024  Hip flexion         Hip extension         Hip abduction         Hip adduction         Hip internal rotation         Hip external rotation         Knee flexion  80/84 90/92 124 105 102 108 AROM in supine heel slide   Knee extension  +9/+5 +8AA 0 -6 -5 -3 in supine heel prop.   Ankle dorsiflexion         Ankle plantarflexion         Ankle inversion         Ankle eversion          (Blank rows = not tested)  LOWER EXTREMITY MMT:  MMT Right eval Left Eval 09/22/2024 Right 10/31/2024 Left 10/31/2024 Left 11/18/2024   Hip flexion  4     Hip extension       Hip abduction  3+     Hip adduction       Hip internal rotation       Hip external rotation       Knee flexion  3     Knee extension  3 38, 37 lbs 28.4, 26 lbs 33, 33.9 lbs  Ankle dorsiflexion       Ankle plantarflexion       Ankle inversion       Ankle eversion        (Blank rows = not tested)  LOWER EXTREMITY SPECIAL TESTS:  09/22/2024 S/P no special tests completed  FUNCTIONAL TESTS:  09/22/2024 5 times sit to stand: 32 sec  GAIT: 10/24/2024: Ambulation  with SPC in Rt UE, antalgic gait noted with trunk lean away from Lt with reduced stance, TKE, toe press off with Lt leg.   10/17/2024: Ambulation to clinic and in clinic with SPC in Rt UE. Reduced stance on Lt, lacking TKE.   09/22/2024 Distance walked: household Assistive device utilized: Environmental Consultant - 2 wheeled Level of assistance: Complete Independence Comments: good form                     TREATMENT        DATE: 12/02/2024    ***   TREATMENT  DATE: 11/29/2024    TherEx:  Recumbent bike level 3 for 10 minutes  Knee extension machine bilat up, unilat down 1x7, 1x6 Slant board gastroc stretch 3x45s   TherAct:  Sit<>stand from 18 chair no UE use 2x5 Step up and over with slight UE use on // bars using 6 step 1x15    TREATMENT        DATE: 11/24/2024    Therex: UBE LE only seat 10 lvl 1.0 (machine resistance out).  10 mins for ROM, endurance Knee extension machine double leg up, Lt leg lowering slowly (some assistance in Rt at times) 5 lbs x 15(setting 3 slot max flexion). Knee flexion machine Lt leg only 10 lbs x 15 Seated quad set with SLR Lt 2-3 sec hold in 2-4 inch lift x 15  TherActivity (to improve stairs, transfers, squatting) Sit to stand to sit 18 inch chair with foam pad UE assist x 10 Lateral step up/down WB on Lt leg 4 inch step x 15     TREATMENT        DATE: 11/21/2024    Therex: UBE LE only seat 10 lvl 1.0 (machine resistance out).  10 mins for ROM, endurance Incline gastroc stretch 30 sec x 5  Knee extension machine double leg up, Lt leg lowering slowly (some assistance in Rt at times) 5 lbs x 10 (setting 3 slot max flexion).  Knee flexion machine Lt leg only 10 lbs x 10  Seated quad set with SLR Lt 3 x 5   Manual Lt knee flexion distraction with IR mobilization with movement.     TREATMENT        DATE: 11/18/2024    Therex: Recumbent bike lvl 3 10 mins for ROM, seat 8 Incilne gastroc stretch 30 sec x 5 bilaterally  Supine heel slide AROM 10 sec x  5 Supine heel prop 1 min     TherActivity (to improve stairs, transfers, ambulation in community) Leg press double leg 87 lbs x 17 slow lowering focus Leg press single leg 43 lbs 2 x 15 bilaterally  Step on over and down 4 inch step WB on Lt leg x 10 with single hand assist.   Self Care Cross friction massage education for incision mobility.    TREATMENT        DATE: 11/14/2024    Therex: Recumbent bike lvl 3 10 mins for ROM, seat 8 Discussed importance of edema control and use of HEP in times of throbbing/symptoms.  Incline gastroc stretch 30 sec x 3 bilaterally   TherActivity (to improve stairs, transfers, ambulation in community) Leg press double leg 87 lbs x 17 slow lowering focus Leg press single leg 43 lbs 2 x 15 bilaterally   Neuro Re-ed Step to pattern over 6 inch hurdle in // bars with occasional HHA 10 ft x 4 leading with each LE Side to side step to pattern over 6 inch hurdle in // bars with occasional HHA 10 ft x 3 each way   Vaso 10 mins Lt leg in elevation low compression 34 deg with wrap shifted as high superiorly as possible.     PATIENT EDUCATION:  Education details: HEP Person educated: Patient Education method: Programmer, Multimedia, Demonstration, Actor cues, and Verbal cues Education comprehension: verbalized understanding  HOME EXERCISE PROGRAM: Access Code: [email or web address] URL: [email or web address] Date: 10/11/2024 Prepared by: Lamar Ivory  Exercises - Supine Quad Set  - 2 x daily - 7 x weekly - 2 sets - 10 reps - 3 hold -  Supine Active Straight Leg Raise  - 2 x daily - 7 x weekly - 2 sets - 10 reps - Supine Heel Slides  - 2 x daily - 7 x weekly - 2 sets - 10 reps - Heel Raises with Counter Support  - 2 x daily - 7 x weekly - 2 sets - 10 reps - Mini Squat with Counter Support  - 2 x daily - 7 x weekly - 2 sets - 10 reps - Standing March with Counter Support  - 2 x daily - 7 x weekly - 2 sets - 10 reps - Supine Quadricep Sets  - 5 x daily -  7 x weekly - 2 sets - 10 reps - 5 second hold - Seated Straight Leg Raise   - 2 x daily - 7 x weekly - 3-5 sets - 5 reps  ASSESSMENT:  CLINICAL IMPRESSION: Patient arrived to session ***. Patient will continue to benefit from skilled PT.   OBJECTIVE IMPAIRMENTS: decreased balance, decreased endurance, difficulty walking, decreased ROM, decreased strength, hypomobility, increased edema, impaired flexibility, and pain.   ACTIVITY LIMITATIONS: bending, sitting, standing, squatting, stairs, transfers, bed mobility, bathing, and toileting  PARTICIPATION LIMITATIONS: meal prep, driving, and community activity  PERSONAL FACTORS: --are also affecting patient's functional outcome.   REHAB POTENTIAL: Good  CLINICAL DECISION MAKING: Stable/uncomplicated  EVALUATION COMPLEXITY: Moderate   GOALS: Goals reviewed with patient? Yes  SHORT TERM GOALS: Target date: 10/20/2024 4 weeks    Pt to be independent with HEP. Baseline: Goal status: Met 10/20/2024  2.  Decrease pain by 1 level. Baseline:  Goal status: Met 10/11/2024  3.  Pt to be able to ambulate short community distances with SPC. Baseline: RW Goal status: Met 10/11/2024  LONG TERM GOALS: Target date: 12/15/2024  12 weeks    Pt to be independent with self progressive HEP at discharge.   Baseline:  Goal status: on going 10/31/2024  2.  Decrease pain to max 2/10 with all activities. Baseline:  Goal status: on going 10/31/2024  3.  Increase AROM to 0> 105+L knee.  Goal status: on going 10/31/2024  4.  Increase strength to at least 4>4+/5 in L LE.  Goal status: on going 10/31/2024  5.  Able to ambulate community distances without an AD.  Goal status: on going 10/31/2024  6.  Increase score of PSFS to > 5.  Goal status: met on average 10/31/2024   PLAN:  PT FREQUENCY: 2x/week  PT DURATION: 10 weeks  PLANNED INTERVENTIONS: 97164- PT Re-evaluation, 97110-Therapeutic exercises, 97530- Therapeutic activity,  97112- Neuromuscular re-education, 97535- Self Care, [id number]- Manual therapy, [phone]- Gait training, [phone]- Aquatic Therapy, [phone]- Electrical stimulation (unattended), 97016- Vasopneumatic device, Patient/Family education, Balance training, Stair training, Joint mobilization, and Scar mobilization  PLAN FOR NEXT SESSION:  *** [employer] approval date ends 12/01/2024.  Will need more approval at that time.   Be aware of her fibular fracture.: has been cleared    Susannah Daring, PT, DPT 12/01/24 8:03 AM         Date of referral: 09/08/24 Referring provider: Jerona Sage MD Referring diagnosis?  M17.12 (ICD-10-CM) - Osteoarthritis of left knee, unspecified osteoarthritis type  Z96.652 (ICD-10-CM) - S/P total knee arthroplasty, left   Treatment diagnosis? (if different than referring diagnosis) M25.562  R60.0  R26.2  F37.18M73.10  What was this (referring dx) caused by? Arthritis  Nature of Condition: Chronic (continuous duration > 3 months)   Laterality: Lt  Current Functional Measure Score: Patient Specific Functional Scale  0  Objective measurements identify impairments when they are compared to normal values, the uninvolved extremity, and prior level of function.  [x]  Yes  []  No  Objective assessment of functional ability: Severe functional limitations   Briefly describe symptoms: pain, decreased ROM, decreased strength  How did symptoms start: Pain in knee started in the spring  Average pain intensity:  Last 24 hours: 6/10  Past week: 9/10  How often does the pt experience symptoms? Constantly  How much have the symptoms interfered with usual daily activities? Extremely  How has condition changed since care began at this facility? NA - initial visit  In general, how is the patients overall health? Good   BACK PAIN (STarT Back Screening Tool) No  "

## 2024-12-02 ENCOUNTER — Ambulatory Visit

## 2024-12-02 DIAGNOSIS — M6281 Muscle weakness (generalized): Secondary | ICD-10-CM

## 2024-12-02 DIAGNOSIS — M25562 Pain in left knee: Secondary | ICD-10-CM

## 2024-12-02 DIAGNOSIS — R262 Difficulty in walking, not elsewhere classified: Secondary | ICD-10-CM | POA: Diagnosis not present

## 2024-12-02 DIAGNOSIS — R600 Localized edema: Secondary | ICD-10-CM

## 2024-12-02 DIAGNOSIS — R2689 Other abnormalities of gait and mobility: Secondary | ICD-10-CM

## 2024-12-05 NOTE — Therapy (Signed)
 " OUTPATIENT PHYSICAL THERAPY TREATMENT    Patient Name: Madison Mosley MRN: [id number] DOB:11-22-1950, 74 y.o., female Today's Date: 12/06/2024   END OF SESSION:  PT End of Session - 12/06/24 1428     Visit Number 19    Number of Visits 30    Date for Recertification  01/13/25    Authorization Type [employer] / [place]    Authorization Time Period 6 visits 1/16-2/27    PT Start Time 1345    PT Stop Time 1425    PT Time Calculation (min) 40 min    Activity Tolerance Patient tolerated treatment well    Behavior During Therapy WFL for tasks assessed/performed              Past Medical History:  Diagnosis Date   Arthritis    CAD (coronary artery disease)    stents   GERD (gastroesophageal reflux disease)    Headache    Hyperlipidemia    Hypertension    Past Surgical History:  Procedure Laterality Date   CARDIAC CATHETERIZATION  11/18/1999   Percutaneous revascularization precedure with angioplasty to the proximal LAD and first diagonal    CESAREAN SECTION     CHOLECYSTECTOMY     CORONARY STENT PLACEMENT     KNEE ARTHROSCOPY WITH MEDIAL MENISECTOMY Left 05/06/2024   Procedure: LEFT KNEE ARTHROSCOPY WITH MEDIAL MENISCECTOMY;  Surgeon: Harden Jerona GAILS, MD;  Location: [facility] OR;  Service: Orthopedics;  Laterality: Left;   PARTIAL HYSTERECTOMY     TONSILLECTOMY  11/17/1958   TOTAL KNEE ARTHROPLASTY Left 09/07/2024   Procedure: ARTHROPLASTY, KNEE, TOTAL LEFT;  Surgeon: Harden Jerona GAILS, MD;  Location: [facility] OR;  Service: Orthopedics;  Laterality: Left;   Patient Active Problem List   Diagnosis Date Noted   Arthritis of left knee 09/07/2024   Osteoarthritis of left knee, unspecified osteoarthritis type 09/07/2024   Osteochondral defect of femoral condyle 05/06/2024   Right knee meniscal tear 10/02/2020   Unilateral primary osteoarthritis, right knee 04/25/2020   Low back pain 03/21/2020   Statin myopathy 11/16/2019   Atrophic vaginitis 06/18/2017   Sprain of  calcaneofibular ligament of left ankle 01/16/2017   Obesity (BMI 30.0-34.9) 01/05/2015   CAD S/P percutaneous coronary angioplasty 04/21/2013   Hypercholesterolemia 04/21/2013   Essential hypertension 04/21/2013    PCP: Clarice Nottingham, MD   REFERRING PROVIDER: Harden Jerona GAILS, MD    REFERRING DIAG:  [phone] (ICD-10-CM) - Osteoarthritis of left knee, unspecified osteoarthritis type  Z96.652 (ICD-10-CM) - S/P total knee arthroplasty, left    THERAPY DIAG:  Acute pain of left knee  Localized edema  Difficulty in walking, not elsewhere classified  Muscle weakness (generalized)  Other abnormalities of gait and mobility  Rationale for Evaluation and Treatment: Rehabilitation  ONSET DATE: 09/07/24 Lt TKA  SUBJECTIVE:   SUBJECTIVE STATEMENT: Patient states the hardest thing to do at home is stand from sofa.    PERTINENT HISTORY: Fracture noted on fibula.   L knee pain started in April.  She had a meniscus surgery in June but pain continued.  Now s/p TKA.  No CPM.  Had 6 HHPT visits.  PAIN:  NPRS scale: upon arrival : 2/10 Pain location: Lt anterior knee Pain description: dull, achy, throbbing Aggravating factors: motion, bending, extending  Relieving factors: medication, ice  PRECAUTIONS: None  RED FLAGS: None   WEIGHT BEARING RESTRICTIONS: No  FALLS:  Has patient fallen in last 6 months? No  LIVING ENVIRONMENT: Lives with: lives with their spouse  Lives in: House/apartment Stairs: Yes: External: 2.5 steps; on right going up Has following equipment at home: Vannie - 2 wheeled  OCCUPATION: retired   PLOF: Independent  PATIENT GOALS: decrease pain   NEXT MD VISIT: not listed  OBJECTIVE:  Note: Objective measures were completed at Evaluation unless otherwise noted.  DIAGNOSTIC FINDINGS: MRI 04/12/24  IMPRESSION: Tricompartmental osteoarthrosis. Mild to moderate chondromalacia with moderate reactive joint effusion.   Moderate radial tear at the root of  the medial meniscus with medial displacement of body. There is second likely radial tear at the junction the body anterior horn of the medial meniscus. See above for more detail.  PATIENT SURVEYS:  PSFS: THE PATIENT SPECIFIC FUNCTIONAL SCALE  Place score of 0-10 (0 = unable to perform activity and 10 = able to perform activity at the same level as before injury or problem)  Activity Date: 09/22/24 10/20/2024 10/31/2024 12/02/2024  2 Steps no railing 0 0 4 1  2.driving 0 5 9 9   3.Shower independently 0 8 10 10   4. Tranfers without hands   1 5        Total Score 0 4.3 avg 6 avg 6.25    Total Score = Sum of activity scores/number of activities  Minimally Detectable Change: 3 points (for single activity); 2 points (for average score)  [facility] (nd). The Patient Specific Functional Scale . Retrieved from [email or web address]   COGNITION: 09/22/2024 Overall cognitive status: Within functional limits for tasks assessed     SENSATION: 09/22/2024 [facility]  EDEMA:  09/22/2024 Circumferential: 55 cm  MUSCLE LENGTH: 09/22/2024 Hamstrings:  Left mod restriction    POSTURE: No Significant postural limitations  PALPATION: 09/22/2024 2+ tenderness at anterior knee   LOWER EXTREMITY ROM:   Right eval Left Eval 09/22/2024 Active/Passive Left  09/29/24 Active/Passive Right 10/11/2024 Left 10/11/2024 Left 10/26/2024 Left 11/18/2024 Lt  12/02/2024  Hip flexion          Hip extension          Hip abduction          Hip adduction          Hip internal rotation          Hip external rotation          Knee flexion  80/84 90/92 124 105 102 108 AROM in supine heel slide  AROM: 108deg  Knee extension  +9/+5 +8AA 0 -6 -5 -3 in supine heel prop.  AROM: 5deg of flexion  Ankle dorsiflexion          Ankle plantarflexion          Ankle inversion          Ankle eversion           (Blank rows = not tested)  LOWER EXTREMITY  MMT:  MMT Right eval Left Eval 09/22/2024 Right 10/31/2024 Left 10/31/2024 Left 11/18/2024   12/02/2024  Hip flexion  4      Hip extension        Hip abduction  3+      Hip adduction        Hip internal rotation        Hip external rotation        Knee flexion  3      Knee extension  3 38, 37 lbs 28.4, 26 lbs 33, 33.9 lbs Rt: 42 lbs Lt: 29 lbs  Ankle dorsiflexion        Ankle plantarflexion  Ankle inversion        Ankle eversion         (Blank rows = not tested)  LOWER EXTREMITY SPECIAL TESTS:  09/22/2024 S/P no special tests completed  FUNCTIONAL TESTS:  09/22/2024 5 times sit to stand: 32 sec  GAIT: 10/24/2024: Ambulation with SPC in Rt UE, antalgic gait noted with trunk lean away from Lt with reduced stance, TKE, toe press off with Lt leg.   10/17/2024: Ambulation to clinic and in clinic with SPC in Rt UE. Reduced stance on Lt, lacking TKE.   09/22/2024 Distance walked: household Assistive device utilized: Environmental Consultant - 2 wheeled Level of assistance: Complete Independence Comments: good form                    TREATMENT        DATE: 12/06/24  TherEx:  Recumbent bike level 3 for 10 minutes  Supine quad set with PT overpressure 1x10 with 3s hold  Supine heel slides 1x10 Quad sets with towel roll under knee 10x, under ankle 10x TKE 3x10 TB light resistance  TherAct: Bilat leg press 3x10 with 87# with focus on extension with accent onto heels Unilat leg press 2x12 with 43#  TREATMENT        DATE: 12/02/2024    TherEx:  Recumbent bike level 3 for 10 minutes  Supine quad set with PT overpressure 1x10 with 3s hold  Supine heel slides 1x10 Strength and ROM measurements taken with results noted above   TherAct: Bilat leg press 2x12 with 87#  Unilat leg press 2x12 with 43#  Manual:  Seated knee flexion with IR/distraction with 10s holds in max stretch, PT providing PROM knee extension with slight overpressure between    TREATMENT        DATE: 11/29/2024     TherEx:  Recumbent bike level 3 for 10 minutes  Knee extension machine bilat up, unilat down 1x7, 1x6 with 10# Slant board gastroc stretch 3x45s   TherAct:  Sit<>stand from 18 chair no UE use 2x5 Step up and over with slight UE use on // bars using 6 step 1x15   PATIENT EDUCATION:  Education details: HEP Person educated: Patient Education method: Explanation, Demonstration, Tactile cues, and Verbal cues Education comprehension: verbalized understanding  HOME EXERCISE PROGRAM: Access Code: [email or web address] URL: [email or web address] Date: 10/11/2024 Prepared by: Lamar Ivory  Exercises - Supine Quad Set  - 2 x daily - 7 x weekly - 2 sets - 10 reps - 3 hold - Supine Active Straight Leg Raise  - 2 x daily - 7 x weekly - 2 sets - 10 reps - Supine Heel Slides  - 2 x daily - 7 x weekly - 2 sets - 10 reps - Heel Raises with Counter Support  - 2 x daily - 7 x weekly - 2 sets - 10 reps - Mini Squat with Counter Support  - 2 x daily - 7 x weekly - 2 sets - 10 reps - Standing March with Counter Support  - 2 x daily - 7 x weekly - 2 sets - 10 reps - Supine Quadricep Sets  - 5 x daily - 7 x weekly - 2 sets - 10 reps - 5 second hold - Seated Straight Leg Raise   - 2 x daily - 7 x weekly - 3-5 sets - 5 reps  ASSESSMENT:  CLINICAL IMPRESSION: Patient needed VC and tactile cues for improved quad sets.  Discussed increasing that exercise at home to  increase strength and extension.  OBJECTIVE IMPAIRMENTS: decreased balance, decreased endurance, difficulty walking, decreased ROM, decreased strength, hypomobility, increased edema, impaired flexibility, and pain.   ACTIVITY LIMITATIONS: bending, sitting, standing, squatting, stairs, transfers, bed mobility, bathing, and toileting  PARTICIPATION LIMITATIONS: meal prep, driving, and community activity  PERSONAL FACTORS: --are also affecting patient's functional outcome.   REHAB POTENTIAL: Good  CLINICAL DECISION MAKING:  Stable/uncomplicated  EVALUATION COMPLEXITY: Moderate   GOALS: Goals reviewed with patient? Yes  SHORT TERM GOALS: Target date: 10/20/2024     Pt to be independent with HEP. Baseline: Goal status: Met 10/20/2024  2.  Decrease pain by 1 level. Baseline:  Goal status: Met 10/11/2024  3.  Pt to be able to ambulate short community distances with SPC. Baseline: RW Goal status: Met 10/11/2024  LONG TERM GOALS: Target date: 01/13/2025    Pt to be independent with self progressive HEP at discharge.   Baseline:  Goal status: on going 12/02/2024  2.  Decrease pain to max 2/10 with all activities. Baseline:  Goal status: on going 12/02/2024  3.  Increase AROM to 0> 105+L knee.  Goal status: on going 12/02/2024  4.  Increase strength to at least 4>4+/5 in L LE.  Goal status: on going 12/02/2024  5.  Able to ambulate community distances without an AD.  Goal status: on going 12/02/2024  6.  Increase score of PSFS to > 5.  Goal status: met on average 10/31/2024   PLAN:  PT FREQUENCY: 2x/week  PT DURATION: 6 weeks  PLANNED INTERVENTIONS: 97164- PT Re-evaluation, 97110-Therapeutic exercises, 97530- Therapeutic activity, 97112- Neuromuscular re-education, 97535- Self Care, [id number]- Manual therapy, [phone]- Gait training, [phone]- Aquatic Therapy, [phone]- Electrical stimulation (unattended), 97016- Vasopneumatic device, Patient/Family education, Balance training, Stair training, Joint mobilization, and Scar mobilization  PLAN FOR NEXT SESSION: knee flexion and extension ROM, LE strengthening, balance, stairs without UE  Be aware of her fibular fracture.  **2/6 on authorization**  Burnard Meth, PT 12/06/24  2:30 PM     Date of referral: 09/08/24 Referring provider: Jerona Sage MD Referring diagnosis?  M17.12 (ICD-10-CM) - Osteoarthritis of left knee, unspecified osteoarthritis type  Z96.652 (ICD-10-CM) - S/P total knee arthroplasty, left   Treatment diagnosis? (if different  than referring diagnosis) M25.562  R60.0  R26.2  F37.18M73.10  What was this (referring dx) caused by? Arthritis  Nature of Condition: Chronic (continuous duration > 3 months)   Laterality: Lt  Current Functional Measure Score: Patient Specific Functional Scale 6.25  Objective measurements identify impairments when they are compared to normal values, the uninvolved extremity, and prior level of function.  [x]  Yes  []  No  Objective assessment of functional ability: Moderate functional limitations   Briefly describe symptoms: pain, decreased ROM, decreased strength  How did symptoms start: Pain in knee started in the spring  Average pain intensity:  Last 24 hours: 3/10  Past week: 2-3/10  How often does the pt experience symptoms? Constantly  How much have the symptoms interfered with usual daily activities? Quite a bit  How has condition changed since care began at this facility? A little better  In general, how is the patients overall health? Good   BACK PAIN (STarT Back Screening Tool) No  "

## 2024-12-06 ENCOUNTER — Ambulatory Visit

## 2024-12-06 DIAGNOSIS — M6281 Muscle weakness (generalized): Secondary | ICD-10-CM | POA: Diagnosis not present

## 2024-12-06 DIAGNOSIS — R262 Difficulty in walking, not elsewhere classified: Secondary | ICD-10-CM | POA: Diagnosis not present

## 2024-12-06 DIAGNOSIS — R600 Localized edema: Secondary | ICD-10-CM

## 2024-12-06 DIAGNOSIS — M25562 Pain in left knee: Secondary | ICD-10-CM

## 2024-12-06 DIAGNOSIS — R2689 Other abnormalities of gait and mobility: Secondary | ICD-10-CM | POA: Diagnosis not present

## 2024-12-09 ENCOUNTER — Ambulatory Visit

## 2024-12-09 DIAGNOSIS — M6281 Muscle weakness (generalized): Secondary | ICD-10-CM

## 2024-12-09 DIAGNOSIS — R2689 Other abnormalities of gait and mobility: Secondary | ICD-10-CM | POA: Diagnosis not present

## 2024-12-09 DIAGNOSIS — R600 Localized edema: Secondary | ICD-10-CM

## 2024-12-09 DIAGNOSIS — R262 Difficulty in walking, not elsewhere classified: Secondary | ICD-10-CM

## 2024-12-09 DIAGNOSIS — M25562 Pain in left knee: Secondary | ICD-10-CM | POA: Diagnosis not present

## 2024-12-09 NOTE — Therapy (Signed)
 " OUTPATIENT PHYSICAL THERAPY TREATMENT    Patient Name: Madison Mosley MRN: [id number] DOB:Jun 18, 1951, 74 y.o., female Today's Date: 12/09/2024   END OF SESSION:  PT End of Session - 12/09/24 1101     Visit Number 20    Number of Visits 30    Date for Recertification  01/13/25    Authorization Type [employer] / [place]    Authorization Time Period 6 visits 1/16-2/27    Authorization - Visit Number 3    Authorization - Number of Visits 6    Progress Note Due on Visit 28    PT Start Time 1102    PT Stop Time 1143    PT Time Calculation (min) 41 min    Activity Tolerance Patient tolerated treatment well    Behavior During Therapy WFL for tasks assessed/performed               Past Medical History:  Diagnosis Date   Arthritis    CAD (coronary artery disease)    stents   GERD (gastroesophageal reflux disease)    Headache    Hyperlipidemia    Hypertension    Past Surgical History:  Procedure Laterality Date   CARDIAC CATHETERIZATION  11/18/1999   Percutaneous revascularization precedure with angioplasty to the proximal LAD and first diagonal    CESAREAN SECTION     CHOLECYSTECTOMY     CORONARY STENT PLACEMENT     KNEE ARTHROSCOPY WITH MEDIAL MENISECTOMY Left 05/06/2024   Procedure: LEFT KNEE ARTHROSCOPY WITH MEDIAL MENISCECTOMY;  Surgeon: Harden Jerona GAILS, MD;  Location: [facility] OR;  Service: Orthopedics;  Laterality: Left;   PARTIAL HYSTERECTOMY     TONSILLECTOMY  11/17/1958   TOTAL KNEE ARTHROPLASTY Left 09/07/2024   Procedure: ARTHROPLASTY, KNEE, TOTAL LEFT;  Surgeon: Harden Jerona GAILS, MD;  Location: [facility] OR;  Service: Orthopedics;  Laterality: Left;   Patient Active Problem List   Diagnosis Date Noted   Arthritis of left knee 09/07/2024   Osteoarthritis of left knee, unspecified osteoarthritis type 09/07/2024   Osteochondral defect of femoral condyle 05/06/2024   Right knee meniscal tear 10/02/2020   Unilateral primary osteoarthritis, right knee 04/25/2020    Low back pain 03/21/2020   Statin myopathy 11/16/2019   Atrophic vaginitis 06/18/2017   Sprain of calcaneofibular ligament of left ankle 01/16/2017   Obesity (BMI 30.0-34.9) 01/05/2015   CAD S/P percutaneous coronary angioplasty 04/21/2013   Hypercholesterolemia 04/21/2013   Essential hypertension 04/21/2013    PCP: Clarice Nottingham, MD   REFERRING PROVIDER: Harden Jerona GAILS, MD    REFERRING DIAG:  [phone] (ICD-10-CM) - Osteoarthritis of left knee, unspecified osteoarthritis type  Z96.652 (ICD-10-CM) - S/P total knee arthroplasty, left    THERAPY DIAG:  Acute pain of left knee  Localized edema  Difficulty in walking, not elsewhere classified  Muscle weakness (generalized)  Other abnormalities of gait and mobility  Rationale for Evaluation and Treatment: Rehabilitation  ONSET DATE: 09/07/24 Lt TKA  SUBJECTIVE:   SUBJECTIVE STATEMENT: Patient reports that she has officially stopped using her cane.  PERTINENT HISTORY: Fracture noted on fibula.   L knee pain started in April.  She had a meniscus surgery in June but pain continued.  Now s/p TKA.  No CPM.  Had 6 HHPT visits.  PAIN:  NPRS scale: not rated Pain location: Lt anterior knee Pain description: dull, achy, throbbing Aggravating factors: motion, bending, extending  Relieving factors: medication, ice  PRECAUTIONS: None  RED FLAGS: None   WEIGHT BEARING RESTRICTIONS: No  FALLS:  Has patient fallen in last 6 months? No  LIVING ENVIRONMENT: Lives with: lives with their spouse Lives in: House/apartment Stairs: Yes: External: 2.5 steps; on right going up Has following equipment at home: Vannie - 2 wheeled  OCCUPATION: retired   PLOF: Independent  PATIENT GOALS: decrease pain   NEXT MD VISIT: not listed  OBJECTIVE:  Note: Objective measures were completed at Evaluation unless otherwise noted.  DIAGNOSTIC FINDINGS: MRI 04/12/24  IMPRESSION: Tricompartmental osteoarthrosis. Mild to moderate  chondromalacia with moderate reactive joint effusion.   Moderate radial tear at the root of the medial meniscus with medial displacement of body. There is second likely radial tear at the junction the body anterior horn of the medial meniscus. See above for more detail.  PATIENT SURVEYS:  PSFS: THE PATIENT SPECIFIC FUNCTIONAL SCALE  Place score of 0-10 (0 = unable to perform activity and 10 = able to perform activity at the same level as before injury or problem)  Activity Date: 09/22/24 10/20/2024 10/31/2024 12/02/2024  2 Steps no railing 0 0 4 1  2.driving 0 5 9 9   3.Shower independently 0 8 10 10   4. Tranfers without hands   1 5        Total Score 0 4.3 avg 6 avg 6.25    Total Score = Sum of activity scores/number of activities  Minimally Detectable Change: 3 points (for single activity); 2 points (for average score)  [facility] (nd). The Patient Specific Functional Scale . Retrieved from [email or web address].[email or web address]   COGNITION: 09/22/2024 Overall cognitive status: Within functional limits for tasks assessed     SENSATION: 09/22/2024 [facility]  EDEMA:  09/22/2024 Circumferential: 55 cm  MUSCLE LENGTH: 09/22/2024 Hamstrings:  Left mod restriction    POSTURE: No Significant postural limitations  PALPATION: 09/22/2024 2+ tenderness at anterior knee   LOWER EXTREMITY ROM:   Right eval Left Eval 09/22/2024 Active/Passive Left  09/29/24 Active/Passive Right 10/11/2024 Left 10/11/2024 Left 10/26/2024 Left 11/18/2024 Lt  12/02/2024  Hip flexion          Hip extension          Hip abduction          Hip adduction          Hip internal rotation          Hip external rotation          Knee flexion  80/84 90/92 124 105 102 108 AROM in supine heel slide  AROM: 108deg  Knee extension  +9/+5 +8AA 0 -6 -5 -3 in supine heel prop.  AROM: 5deg of flexion  Ankle dorsiflexion          Ankle plantarflexion          Ankle  inversion          Ankle eversion           (Blank rows = not tested)  LOWER EXTREMITY MMT:  MMT Right eval Left Eval 09/22/2024 Right 10/31/2024 Left 10/31/2024 Left 11/18/2024   12/02/2024  Hip flexion  4      Hip extension        Hip abduction  3+      Hip adduction        Hip internal rotation        Hip external rotation        Knee flexion  3      Knee extension  3 38, 37 lbs 28.4, 26 lbs 33, 33.9 lbs Rt: 42 lbs  Lt: 29 lbs  Ankle dorsiflexion        Ankle plantarflexion        Ankle inversion        Ankle eversion         (Blank rows = not tested)  LOWER EXTREMITY SPECIAL TESTS:  09/22/2024 S/P no special tests completed  FUNCTIONAL TESTS:  09/22/2024 5 times sit to stand: 32 sec  GAIT: 10/24/2024: Ambulation with SPC in Rt UE, antalgic gait noted with trunk lean away from Lt with reduced stance, TKE, toe press off with Lt leg.   10/17/2024: Ambulation to clinic and in clinic with SPC in Rt UE. Reduced stance on Lt, lacking TKE.   09/22/2024 Distance walked: household Assistive device utilized: Environmental Consultant - 2 wheeled Level of assistance: Complete Independence Comments: good form                    TREATMENT        DATE: 12/09/24 TherEx: Recumbent bike level 3 for 10 minutes  Knee extension machine bilat up, Lt down 2x11 with 10#  Seated knee flexion with green TB 2x10 with 3s hold   TherAct:  Bilat leg press 3x12 with 87#  Unilat leg press 2x12 with 43#  Step up and over with 4 step without UE use 2x6 ; intermittent fingertip to // bars with very minimal lateral LOB/instability   TREATMENT        DATE: 12/06/24  TherEx:  Recumbent bike level 3 for 10 minutes  Supine quad set with PT overpressure 1x10 with 3s hold  Supine heel slides 1x10 Quad sets with towel roll under knee 10x, under ankle 10x TKE 3x10 TB light resistance  TherAct: Bilat leg press 3x10 with 87# with focus on extension with accent onto heels Unilat leg press 2x12 with 43#  TREATMENT         DATE: 12/02/2024    TherEx:  Recumbent bike level 3 for 10 minutes  Supine quad set with PT overpressure 1x10 with 3s hold  Supine heel slides 1x10 Strength and ROM measurements taken with results noted above   TherAct: Bilat leg press 2x12 with 87#  Unilat leg press 2x12 with 43#  Manual:  Seated knee flexion with IR/distraction with 10s holds in max stretch, PT providing PROM knee extension with slight overpressure between    TREATMENT        DATE: 11/29/2024    TherEx:  Recumbent bike level 3 for 10 minutes  Knee extension machine bilat up, unilat down 1x7, 1x6 with 10# Slant board gastroc stretch 3x45s   TherAct:  Sit<>stand from 18 chair no UE use 2x5 Step up and over with slight UE use on // bars using 6 step 1x15   PATIENT EDUCATION:  Education details: HEP Person educated: Patient Education method: Explanation, Demonstration, Tactile cues, and Verbal cues Education comprehension: verbalized understanding  HOME EXERCISE PROGRAM: Access Code: [email or web address] URL: [email or web address] Date: 10/11/2024 Prepared by: Lamar Ivory  Exercises - Supine Quad Set  - 2 x daily - 7 x weekly - 2 sets - 10 reps - 3 hold - Supine Active Straight Leg Raise  - 2 x daily - 7 x weekly - 2 sets - 10 reps - Supine Heel Slides  - 2 x daily - 7 x weekly - 2 sets - 10 reps - Heel Raises with Counter Support  - 2 x daily - 7 x weekly - 2 sets - 10 reps - Mini Squat with Counter  Support  - 2 x daily - 7 x weekly - 2 sets - 10 reps - Standing March with Counter Support  - 2 x daily - 7 x weekly - 2 sets - 10 reps - Supine Quadricep Sets  - 5 x daily - 7 x weekly - 2 sets - 10 reps - 5 second hold - Seated Straight Leg Raise   - 2 x daily - 7 x weekly - 3-5 sets - 5 reps  ASSESSMENT:  CLINICAL IMPRESSION: Patient arrived to session endorsing ability to discontinue use of cane, however, continues to have difficulty with prolonged standing due to deficits in stamina.  Patient tolerated all activities this date. Patient will continue to benefit from skilled PT.  OBJECTIVE IMPAIRMENTS: decreased balance, decreased endurance, difficulty walking, decreased ROM, decreased strength, hypomobility, increased edema, impaired flexibility, and pain.   ACTIVITY LIMITATIONS: bending, sitting, standing, squatting, stairs, transfers, bed mobility, bathing, and toileting  PARTICIPATION LIMITATIONS: meal prep, driving, and community activity  PERSONAL FACTORS: --are also affecting patient's functional outcome.   REHAB POTENTIAL: Good  CLINICAL DECISION MAKING: Stable/uncomplicated  EVALUATION COMPLEXITY: Moderate   GOALS: Goals reviewed with patient? Yes  SHORT TERM GOALS: Target date: 10/20/2024     Pt to be independent with HEP. Baseline: Goal status: Met 10/20/2024  2.  Decrease pain by 1 level. Baseline:  Goal status: Met 10/11/2024  3.  Pt to be able to ambulate short community distances with SPC. Baseline: RW Goal status: Met 10/11/2024  LONG TERM GOALS: Target date: 01/13/2025    Pt to be independent with self progressive HEP at discharge.   Baseline:  Goal status: on going 12/02/2024  2.  Decrease pain to max 2/10 with all activities. Baseline:  Goal status: on going 12/02/2024  3.  Increase AROM to 0> 105+L knee.  Goal status: on going 12/02/2024  4.  Increase strength to at least 4>4+/5 in L LE.  Goal status: on going 12/02/2024  5.  Able to ambulate community distances without an AD.  Goal status: on going 12/02/2024  6.  Increase score of PSFS to > 5.  Goal status: met on average 10/31/2024   PLAN:  PT FREQUENCY: 2x/week  PT DURATION: 6 weeks  PLANNED INTERVENTIONS: 97164- PT Re-evaluation, 97110-Therapeutic exercises, 97530- Therapeutic activity, 97112- Neuromuscular re-education, 97535- Self Care, [id number]- Manual therapy, [phone]- Gait training, [phone]- Aquatic Therapy, [phone]- Electrical stimulation (unattended), 97016-  Vasopneumatic device, Patient/Family education, Balance training, Stair training, Joint mobilization, and Scar mobilization  PLAN FOR NEXT SESSION: stamina/standing endurance, knee flexion and extension ROM, LE strengthening, balance, stairs without UE  Be aware of her fibular fracture.  **3/6 on authorization**  Susannah Daring, PT, DPT 12/09/24 12:50 PM    Date of referral: 09/08/24 Referring provider: Jerona Sage MD Referring diagnosis?  M17.12 (ICD-10-CM) - Osteoarthritis of left knee, unspecified osteoarthritis type  Z96.652 (ICD-10-CM) - S/P total knee arthroplasty, left   Treatment diagnosis? (if different than referring diagnosis) M25.562  R60.0  R26.2  F37.18M73.10  What was this (referring dx) caused by? Arthritis  Nature of Condition: Chronic (continuous duration > 3 months)   Laterality: Lt  Current Functional Measure Score: Patient Specific Functional Scale 6.25  Objective measurements identify impairments when they are compared to normal values, the uninvolved extremity, and prior level of function.  [x]  Yes  []  No  Objective assessment of functional ability: Moderate functional limitations   Briefly describe symptoms: pain, decreased ROM, decreased strength  How did symptoms start: Pain in knee  started in the spring  Average pain intensity:  Last 24 hours: 3/10  Past week: 2-3/10  How often does the pt experience symptoms? Constantly  How much have the symptoms interfered with usual daily activities? Quite a bit  How has condition changed since care began at this facility? A little better  In general, how is the patients overall health? Good   BACK PAIN (STarT Back Screening Tool) No  "

## 2024-12-13 ENCOUNTER — Ambulatory Visit

## 2024-12-13 DIAGNOSIS — R600 Localized edema: Secondary | ICD-10-CM | POA: Diagnosis not present

## 2024-12-13 DIAGNOSIS — M6281 Muscle weakness (generalized): Secondary | ICD-10-CM

## 2024-12-13 DIAGNOSIS — R262 Difficulty in walking, not elsewhere classified: Secondary | ICD-10-CM

## 2024-12-13 DIAGNOSIS — M25562 Pain in left knee: Secondary | ICD-10-CM

## 2024-12-13 DIAGNOSIS — R2689 Other abnormalities of gait and mobility: Secondary | ICD-10-CM | POA: Diagnosis not present

## 2024-12-13 NOTE — Therapy (Signed)
 " OUTPATIENT PHYSICAL THERAPY TREATMENT    Patient Name: Madison Mosley MRN: [id number] DOB:Nov 30, 1950, 74 y.o., female Today's Date: 12/13/2024   END OF SESSION:  PT End of Session - 12/13/24 1343     Visit Number 21    Number of Visits 30    Date for Recertification  01/13/25    Authorization Type [employer] / [place]    Authorization Time Period 6 visits 1/16-2/27    Authorization - Visit Number 4    Authorization - Number of Visits 6    Progress Note Due on Visit 28    PT Start Time 1347    PT Stop Time 1428    PT Time Calculation (min) 41 min    Activity Tolerance Patient tolerated treatment well    Behavior During Therapy WFL for tasks assessed/performed             Past Medical History:  Diagnosis Date   Arthritis    CAD (coronary artery disease)    stents   GERD (gastroesophageal reflux disease)    Headache    Hyperlipidemia    Hypertension    Past Surgical History:  Procedure Laterality Date   CARDIAC CATHETERIZATION  11/18/1999   Percutaneous revascularization precedure with angioplasty to the proximal LAD and first diagonal    CESAREAN SECTION     CHOLECYSTECTOMY     CORONARY STENT PLACEMENT     KNEE ARTHROSCOPY WITH MEDIAL MENISECTOMY Left 05/06/2024   Procedure: LEFT KNEE ARTHROSCOPY WITH MEDIAL MENISCECTOMY;  Surgeon: Harden Jerona GAILS, MD;  Location: [facility] OR;  Service: Orthopedics;  Laterality: Left;   PARTIAL HYSTERECTOMY     TONSILLECTOMY  11/17/1958   TOTAL KNEE ARTHROPLASTY Left 09/07/2024   Procedure: ARTHROPLASTY, KNEE, TOTAL LEFT;  Surgeon: Harden Jerona GAILS, MD;  Location: [facility] OR;  Service: Orthopedics;  Laterality: Left;   Patient Active Problem List   Diagnosis Date Noted   Arthritis of left knee 09/07/2024   Osteoarthritis of left knee, unspecified osteoarthritis type 09/07/2024   Osteochondral defect of femoral condyle 05/06/2024   Right knee meniscal tear 10/02/2020   Unilateral primary osteoarthritis, right knee 04/25/2020   Low  back pain 03/21/2020   Statin myopathy 11/16/2019   Atrophic vaginitis 06/18/2017   Sprain of calcaneofibular ligament of left ankle 01/16/2017   Obesity (BMI 30.0-34.9) 01/05/2015   CAD S/P percutaneous coronary angioplasty 04/21/2013   Hypercholesterolemia 04/21/2013   Essential hypertension 04/21/2013    PCP: Clarice Nottingham, MD   REFERRING PROVIDER: Harden Jerona GAILS, MD    REFERRING DIAG:  [phone] (ICD-10-CM) - Osteoarthritis of left knee, unspecified osteoarthritis type  Z96.652 (ICD-10-CM) - S/P total knee arthroplasty, left    THERAPY DIAG:  Acute pain of left knee  Localized edema  Difficulty in walking, not elsewhere classified  Muscle weakness (generalized)  Other abnormalities of gait and mobility  Rationale for Evaluation and Treatment: Rehabilitation  ONSET DATE: 09/07/24 Lt TKA  SUBJECTIVE:   SUBJECTIVE STATEMENT: Patient endorsing doing well and continuing no use of SPC.  PERTINENT HISTORY: Fracture noted on fibula.   L knee pain started in April.  She had a meniscus surgery in June but pain continued.  Now s/p TKA.  No CPM.  Had 6 HHPT visits.  PAIN:  NPRS scale: not rated this session Pain location: Lt anterior knee Pain description: dull, achy, throbbing Aggravating factors: motion, bending, extending  Relieving factors: medication, ice  PRECAUTIONS: None  RED FLAGS: None   WEIGHT BEARING RESTRICTIONS: No  FALLS:  Has patient fallen in last 6 months? No  LIVING ENVIRONMENT: Lives with: lives with their spouse Lives in: House/apartment Stairs: Yes: External: 2.5 steps; on right going up Has following equipment at home: Vannie - 2 wheeled  OCCUPATION: retired   PLOF: Independent  PATIENT GOALS: decrease pain   NEXT MD VISIT: not listed  OBJECTIVE:  Note: Objective measures were completed at Evaluation unless otherwise noted.  DIAGNOSTIC FINDINGS: MRI 04/12/24  IMPRESSION: Tricompartmental osteoarthrosis. Mild to moderate  chondromalacia with moderate reactive joint effusion.   Moderate radial tear at the root of the medial meniscus with medial displacement of body. There is second likely radial tear at the junction the body anterior horn of the medial meniscus. See above for more detail.  PATIENT SURVEYS:  PSFS: THE PATIENT SPECIFIC FUNCTIONAL SCALE  Place score of 0-10 (0 = unable to perform activity and 10 = able to perform activity at the same level as before injury or problem)  Activity Date: 09/22/24 10/20/2024 10/31/2024 12/02/2024  2 Steps no railing 0 0 4 1  2.driving 0 5 9 9   3.Shower independently 0 8 10 10   4. Tranfers without hands   1 5        Total Score 0 4.3 avg 6 avg 6.25    Total Score = Sum of activity scores/number of activities  Minimally Detectable Change: 3 points (for single activity); 2 points (for average score)  [facility] (nd). The Patient Specific Functional Scale . Retrieved from [email or web address]   COGNITION: 09/22/2024 Overall cognitive status: Within functional limits for tasks assessed     SENSATION: 09/22/2024 [facility]  EDEMA:  09/22/2024 Circumferential: 55 cm  MUSCLE LENGTH: 09/22/2024 Hamstrings:  Left mod restriction    POSTURE: No Significant postural limitations  PALPATION: 09/22/2024 2+ tenderness at anterior knee   LOWER EXTREMITY ROM:   Right eval Left Eval 09/22/2024 Active/Passive Left  09/29/24 Active/Passive Right 10/11/2024 Left 10/11/2024 Left 10/26/2024 Left 11/18/2024 Lt  12/02/2024  Hip flexion          Hip extension          Hip abduction          Hip adduction          Hip internal rotation          Hip external rotation          Knee flexion  80/84 90/92 124 105 102 108 AROM in supine heel slide  AROM: 108deg  Knee extension  +9/+5 +8AA 0 -6 -5 -3 in supine heel prop.  AROM: 5deg of flexion  Ankle dorsiflexion          Ankle plantarflexion          Ankle  inversion          Ankle eversion           (Blank rows = not tested)  LOWER EXTREMITY MMT:  MMT Right eval Left Eval 09/22/2024 Right 10/31/2024 Left 10/31/2024 Left 11/18/2024   12/02/2024  Hip flexion  4      Hip extension        Hip abduction  3+      Hip adduction        Hip internal rotation        Hip external rotation        Knee flexion  3      Knee extension  3 38, 37 lbs 28.4, 26 lbs 33, 33.9 lbs Rt: 42 lbs  Lt: 29 lbs  Ankle dorsiflexion        Ankle plantarflexion        Ankle inversion        Ankle eversion         (Blank rows = not tested)  LOWER EXTREMITY SPECIAL TESTS:  09/22/2024 S/P no special tests completed  FUNCTIONAL TESTS:  09/22/2024 5 times sit to stand: 32 sec  GAIT: 10/24/2024: Ambulation with SPC in Rt UE, antalgic gait noted with trunk lean away from Lt with reduced stance, TKE, toe press off with Lt leg.   10/17/2024: Ambulation to clinic and in clinic with SPC in Rt UE. Reduced stance on Lt, lacking TKE.   09/22/2024 Distance walked: household Assistive device utilized: Environmental Consultant - 2 wheeled Level of assistance: Complete Independence Comments: good form                    TREATMENT        DATE: 12/13/24 TherEx:  Recumbent bike level 3 for 10 minutes   TherAct:  Bilat leg press 2x10 with 93# Unilat leg press 2x10 with 43#  Step up and over 1x8 with 4 step and no UE use ; increase to 6 step 2x8 with no UE use  Step over and back with small hurdle 2x10 stepping with each leg; intermittent UE use on Rt side secondary to imbalance from fatigue  Lateral step over small hurdle 2x10 with no UE use   TREATMENT        DATE: 12/09/24 TherEx: Recumbent bike level 3 for 10 minutes  Knee extension machine bilat up, Lt down 2x11 with 10#  Seated knee flexion with green TB 2x10 with 3s hold   TherAct:  Bilat leg press 3x12 with 87#  Unilat leg press 2x12 with 43#  Step up and over with 4 step without UE use 2x6 ; intermittent fingertip to //  bars with very minimal lateral LOB/instability   TREATMENT        DATE: 12/06/24  TherEx:  Recumbent bike level 3 for 10 minutes  Supine quad set with PT overpressure 1x10 with 3s hold  Supine heel slides 1x10 Quad sets with towel roll under knee 10x, under ankle 10x TKE 3x10 TB light resistance  TherAct: Bilat leg press 3x10 with 87# with focus on extension with accent onto heels Unilat leg press 2x12 with 43#  TREATMENT        DATE: 12/02/2024    TherEx:  Recumbent bike level 3 for 10 minutes  Supine quad set with PT overpressure 1x10 with 3s hold  Supine heel slides 1x10 Strength and ROM measurements taken with results noted above   TherAct: Bilat leg press 2x12 with 87#  Unilat leg press 2x12 with 43#  Manual:  Seated knee flexion with IR/distraction with 10s holds in max stretch, PT providing PROM knee extension with slight overpressure between    TREATMENT        DATE: 11/29/2024    TherEx:  Recumbent bike level 3 for 10 minutes  Knee extension machine bilat up, unilat down 1x7, 1x6 with 10# Slant board gastroc stretch 3x45s   TherAct:  Sit<>stand from 18 chair no UE use 2x5 Step up and over with slight UE use on // bars using 6 step 1x15   PATIENT EDUCATION:  Education details: HEP Person educated: Patient Education method: Explanation, Demonstration, Tactile cues, and Verbal cues Education comprehension: verbalized understanding  HOME EXERCISE PROGRAM: Access Code: [email or web address] URL: [email or web address] Date:  10/11/2024 Prepared by: Lamar Ivory  Exercises - Supine Quad Set  - 2 x daily - 7 x weekly - 2 sets - 10 reps - 3 hold - Supine Active Straight Leg Raise  - 2 x daily - 7 x weekly - 2 sets - 10 reps - Supine Heel Slides  - 2 x daily - 7 x weekly - 2 sets - 10 reps - Heel Raises with Counter Support  - 2 x daily - 7 x weekly - 2 sets - 10 reps - Mini Squat with Counter Support  - 2 x daily - 7 x weekly - 2 sets - 10 reps - Standing  March with Counter Support  - 2 x daily - 7 x weekly - 2 sets - 10 reps - Supine Quadricep Sets  - 5 x daily - 7 x weekly - 2 sets - 10 reps - 5 second hold - Seated Straight Leg Raise   - 2 x daily - 7 x weekly - 3-5 sets - 5 reps  ASSESSMENT:  CLINICAL IMPRESSION: Patient arrived to session endorsing continuous improvements in functional abilities without use of SPC. Patient tolerated all activities this date and is building confidence and safety with single leg weightbearing through Lt LE. Patient will continue to benefit from skilled PT.  OBJECTIVE IMPAIRMENTS: decreased balance, decreased endurance, difficulty walking, decreased ROM, decreased strength, hypomobility, increased edema, impaired flexibility, and pain.   ACTIVITY LIMITATIONS: bending, sitting, standing, squatting, stairs, transfers, bed mobility, bathing, and toileting  PARTICIPATION LIMITATIONS: meal prep, driving, and community activity  PERSONAL FACTORS: --are also affecting patient's functional outcome.   REHAB POTENTIAL: Good  CLINICAL DECISION MAKING: Stable/uncomplicated  EVALUATION COMPLEXITY: Moderate   GOALS: Goals reviewed with patient? Yes  SHORT TERM GOALS: Target date: 10/20/2024     Pt to be independent with HEP. Baseline: Goal status: Met 10/20/2024  2.  Decrease pain by 1 level. Baseline:  Goal status: Met 10/11/2024  3.  Pt to be able to ambulate short community distances with SPC. Baseline: RW Goal status: Met 10/11/2024  LONG TERM GOALS: Target date: 01/13/2025    Pt to be independent with self progressive HEP at discharge.   Baseline:  Goal status: on going 12/02/2024  2.  Decrease pain to max 2/10 with all activities. Baseline:  Goal status: on going 12/02/2024  3.  Increase AROM to 0> 105+L knee.  Goal status: on going 12/02/2024  4.  Increase strength to at least 4>4+/5 in L LE.  Goal status: on going 12/02/2024  5.  Able to ambulate community distances without an  AD.  Goal status: on going 12/02/2024  6.  Increase score of PSFS to > 5.  Goal status: met on average 10/31/2024   PLAN:  PT FREQUENCY: 2x/week  PT DURATION: 6 weeks  PLANNED INTERVENTIONS: 97164- PT Re-evaluation, 97110-Therapeutic exercises, 97530- Therapeutic activity, 97112- Neuromuscular re-education, 97535- Self Care, 02859- Manual therapy, [phone]- Gait training, [phone]- Aquatic Therapy, [phone]- Electrical stimulation (unattended), 97016- Vasopneumatic device, Patient/Family education, Balance training, Stair training, Joint mobilization, and Scar mobilization  PLAN FOR NEXT SESSION:  stamina/standing endurance, knee flexion and extension ROM, LE strengthening, balance, stairs without UE  Be aware of her fibular fracture. *has been cleared*  **4/6 on authorization**  Susannah Daring, PT, DPT 12/13/24 2:44 PM    Date of referral: 09/08/24 Referring provider: Jerona Sage MD Referring diagnosis?  M17.12 (ICD-10-CM) - Osteoarthritis of left knee, unspecified osteoarthritis type  Z96.652 (ICD-10-CM) - S/P total knee arthroplasty, left  Treatment diagnosis? (if different than referring diagnosis) M25.562  R60.0  R26.2  [id number]  What was this (referring dx) caused by? Arthritis  Nature of Condition: Chronic (continuous duration > 3 months)   Laterality: Lt  Current Functional Measure Score: Patient Specific Functional Scale 6.25  Objective measurements identify impairments when they are compared to normal values, the uninvolved extremity, and prior level of function.  [x]  Yes  []  No  Objective assessment of functional ability: Moderate functional limitations   Briefly describe symptoms: pain, decreased ROM, decreased strength  How did symptoms start: Pain in knee started in the spring  Average pain intensity:  Last 24 hours: 3/10  Past week: 2-3/10  How often does the pt experience symptoms? Constantly  How much have the symptoms interfered with usual daily  activities? Quite a bit  How has condition changed since care began at this facility? A little better  In general, how is the patients overall health? Good   BACK PAIN (STarT Back Screening Tool) No  "

## 2024-12-16 ENCOUNTER — Ambulatory Visit (INDEPENDENT_AMBULATORY_CARE_PROVIDER_SITE_OTHER): Admitting: Physical Therapy

## 2024-12-16 ENCOUNTER — Encounter: Payer: Self-pay | Admitting: Physical Therapy

## 2024-12-16 DIAGNOSIS — R262 Difficulty in walking, not elsewhere classified: Secondary | ICD-10-CM

## 2024-12-16 DIAGNOSIS — M25562 Pain in left knee: Secondary | ICD-10-CM | POA: Diagnosis not present

## 2024-12-16 DIAGNOSIS — R2689 Other abnormalities of gait and mobility: Secondary | ICD-10-CM

## 2024-12-16 DIAGNOSIS — R600 Localized edema: Secondary | ICD-10-CM | POA: Diagnosis not present

## 2024-12-16 DIAGNOSIS — M6281 Muscle weakness (generalized): Secondary | ICD-10-CM

## 2024-12-16 NOTE — Therapy (Signed)
 " OUTPATIENT PHYSICAL THERAPY TREATMENT    Patient Name: Madison Mosley MRN: [id number] DOB:17-Nov-1951, 74 y.o., female Today's Date: 12/16/2024   END OF SESSION:  PT End of Session - 12/16/24 0937     Visit Number 22    Number of Visits 30    Date for Recertification  01/13/25    Authorization Type [employer] / [place]    Authorization Time Period 6 visits 1/16-2/27    Authorization - Visit Number 5    Authorization - Number of Visits 6    Progress Note Due on Visit 28    PT Start Time 0933    PT Stop Time 1013    PT Time Calculation (min) 40 min    Activity Tolerance Patient tolerated treatment well    Behavior During Therapy WFL for tasks assessed/performed              Past Medical History:  Diagnosis Date   Arthritis    CAD (coronary artery disease)    stents   GERD (gastroesophageal reflux disease)    Headache    Hyperlipidemia    Hypertension    Past Surgical History:  Procedure Laterality Date   CARDIAC CATHETERIZATION  11/18/1999   Percutaneous revascularization precedure with angioplasty to the proximal LAD and first diagonal    CESAREAN SECTION     CHOLECYSTECTOMY     CORONARY STENT PLACEMENT     KNEE ARTHROSCOPY WITH MEDIAL MENISECTOMY Left 05/06/2024   Procedure: LEFT KNEE ARTHROSCOPY WITH MEDIAL MENISCECTOMY;  Surgeon: Harden Jerona GAILS, MD;  Location: [facility] OR;  Service: Orthopedics;  Laterality: Left;   PARTIAL HYSTERECTOMY     TONSILLECTOMY  11/17/1958   TOTAL KNEE ARTHROPLASTY Left 09/07/2024   Procedure: ARTHROPLASTY, KNEE, TOTAL LEFT;  Surgeon: Harden Jerona GAILS, MD;  Location: [facility] OR;  Service: Orthopedics;  Laterality: Left;   Patient Active Problem List   Diagnosis Date Noted   Arthritis of left knee 09/07/2024   Osteoarthritis of left knee, unspecified osteoarthritis type 09/07/2024   Osteochondral defect of femoral condyle 05/06/2024   Right knee meniscal tear 10/02/2020   Unilateral primary osteoarthritis, right knee 04/25/2020    Low back pain 03/21/2020   Statin myopathy 11/16/2019   Atrophic vaginitis 06/18/2017   Sprain of calcaneofibular ligament of left ankle 01/16/2017   Obesity (BMI 30.0-34.9) 01/05/2015   CAD S/P percutaneous coronary angioplasty 04/21/2013   Hypercholesterolemia 04/21/2013   Essential hypertension 04/21/2013    PCP: Clarice Nottingham, MD   REFERRING PROVIDER: Harden Jerona GAILS, MD    REFERRING DIAG:  [phone] (ICD-10-CM) - Osteoarthritis of left knee, unspecified osteoarthritis type  Z96.652 (ICD-10-CM) - S/P total knee arthroplasty, left    THERAPY DIAG:  Acute pain of left knee  Localized edema  Difficulty in walking, not elsewhere classified  Muscle weakness (generalized)  Other abnormalities of gait and mobility  Rationale for Evaluation and Treatment: Rehabilitation  ONSET DATE: 09/07/24 Lt TKA  SUBJECTIVE:   SUBJECTIVE STATEMENT: Knee is doing well; feels like she's getting better every day  PERTINENT HISTORY: Fracture noted on fibula.   L knee pain started in April.  She had a meniscus surgery in June but pain continued.  Now s/p TKA.  No CPM.  Had 6 HHPT visits.  PAIN:  NPRS scale: not rated this session Pain location: Lt anterior knee Pain description: dull, achy, throbbing Aggravating factors: motion, bending, extending  Relieving factors: medication, ice  PRECAUTIONS: None  RED FLAGS: None   WEIGHT BEARING  RESTRICTIONS: No  FALLS:  Has patient fallen in last 6 months? No  LIVING ENVIRONMENT: Lives with: lives with their spouse Lives in: House/apartment Stairs: Yes: External: 2.5 steps; on right going up Has following equipment at home: Vannie - 2 wheeled  OCCUPATION: retired   PLOF: Independent  PATIENT GOALS: decrease pain   NEXT MD VISIT: not listed  OBJECTIVE:  Note: Objective measures were completed at Evaluation unless otherwise noted.  DIAGNOSTIC FINDINGS: MRI 04/12/24  IMPRESSION: Tricompartmental osteoarthrosis. Mild to  moderate chondromalacia with moderate reactive joint effusion.   Moderate radial tear at the root of the medial meniscus with medial displacement of body. There is second likely radial tear at the junction the body anterior horn of the medial meniscus. See above for more detail.  PATIENT SURVEYS:  PSFS: THE PATIENT SPECIFIC FUNCTIONAL SCALE  Place score of 0-10 (0 = unable to perform activity and 10 = able to perform activity at the same level as before injury or problem)  Activity Date: 09/22/24 10/20/2024 10/31/2024 12/02/2024  2 Steps no railing 0 0 4 1  2.driving 0 5 9 9   3.Shower independently 0 8 10 10   4. Tranfers without hands   1 5        Total Score 0 4.3 avg 6 avg 6.25    Total Score = Sum of activity scores/number of activities  Minimally Detectable Change: 3 points (for single activity); 2 points (for average score)  [facility] (nd). The Patient Specific Functional Scale . Retrieved from [email or web address]   COGNITION: 09/22/2024 Overall cognitive status: Within functional limits for tasks assessed     SENSATION: 09/22/2024 [facility]  EDEMA:  09/22/2024 Circumferential: 55 cm  MUSCLE LENGTH: 09/22/2024 Hamstrings:  Left mod restriction    POSTURE: No Significant postural limitations  PALPATION: 09/22/2024 2+ tenderness at anterior knee   LOWER EXTREMITY ROM:   Right eval Left Eval 09/22/2024 Active/Passive Left  09/29/24 Active/Passive Right 10/11/2024 Left 10/11/2024 Left 10/26/2024 Left 11/18/2024 Lt  12/02/2024  Knee flexion  80/84 90/92 124 105 102 108 AROM in supine heel slide  AROM: 108deg  Knee extension  +9/+5 +8AA 0 -6 -5 -3 in supine heel prop.  AROM: 5deg of flexion   (Blank rows = not tested)  LOWER EXTREMITY MMT:  MMT Right eval Left Eval 09/22/2024 Right 10/31/2024 Left 10/31/2024 Left 11/18/2024   12/02/2024  Hip flexion  4      Hip extension        Hip abduction  3+       Hip adduction        Hip internal rotation        Hip external rotation        Knee flexion  3      Knee extension  3 38, 37 lbs 28.4, 26 lbs 33, 33.9 lbs Rt: 42 lbs Lt: 29 lbs  Ankle dorsiflexion        Ankle plantarflexion        Ankle inversion        Ankle eversion         (Blank rows = not tested)  LOWER EXTREMITY SPECIAL TESTS:  09/22/2024 S/P no special tests completed  FUNCTIONAL TESTS:  09/22/2024 5 times sit to stand: 32 sec  GAIT: 10/24/2024: Ambulation with SPC in Rt UE, antalgic gait noted with trunk lean away from Lt with reduced stance, TKE, toe press off with Lt leg.   10/17/2024: Ambulation to clinic and in clinic with [facility]  in Rt UE. Reduced stance on Lt, lacking TKE.   09/22/2024 Distance walked: household Assistive device utilized: Environmental Consultant - 2 wheeled Level of assistance: Complete Independence Comments: good form                     TREATMENT        DATE: 12/16/24 TherEx Recumbent bike level 4 for 10 minutes   TherAct Bilat leg press 2x15 with 93# Unilat leg press 2x15 with 43#  LLE on 4 step with Rt heel tap 2x10 Step up and over with LLE on 6 step 2x10; light UE support needed  Neuro Re-Ed Sidestepping on foam x 5 laps with intermittent UE support Tandem walking forward/backward on foam x 5 laps; light UE support needed    TREATMENT        DATE: 12/13/24 TherEx:  Recumbent bike level 3 for 10 minutes   TherAct:  Bilat leg press 2x10 with 93# Unilat leg press 2x10 with 43#  Step up and over 1x8 with 4 step and no UE use ; increase to 6 step 2x8 with no UE use  Step over and back with small hurdle 2x10 stepping with each leg; intermittent UE use on Rt side secondary to imbalance from fatigue  Lateral step over small hurdle 2x10 with no UE use   TREATMENT        DATE: 12/09/24 TherEx: Recumbent bike level 3 for 10 minutes  Knee extension machine bilat up, Lt down 2x11 with 10#  Seated knee flexion with green TB 2x10 with 3s hold    TherAct:  Bilat leg press 3x12 with 87#  Unilat leg press 2x12 with 43#  Step up and over with 4 step without UE use 2x6 ; intermittent fingertip to // bars with very minimal lateral LOB/instability   TREATMENT        DATE: 12/06/24  TherEx:  Recumbent bike level 3 for 10 minutes  Supine quad set with PT overpressure 1x10 with 3s hold  Supine heel slides 1x10 Quad sets with towel roll under knee 10x, under ankle 10x TKE 3x10 TB light resistance  TherAct: Bilat leg press 3x10 with 87# with focus on extension with accent onto heels Unilat leg press 2x12 with 43#  TREATMENT        DATE: 12/02/2024    TherEx:  Recumbent bike level 3 for 10 minutes  Supine quad set with PT overpressure 1x10 with 3s hold  Supine heel slides 1x10 Strength and ROM measurements taken with results noted above   TherAct: Bilat leg press 2x12 with 87#  Unilat leg press 2x12 with 43#  Manual:  Seated knee flexion with IR/distraction with 10s holds in max stretch, PT providing PROM knee extension with slight overpressure between    PATIENT EDUCATION:  Education details: HEP Person educated: Patient Education method: Programmer, Multimedia, Demonstration, Actor cues, and Verbal cues Education comprehension: verbalized understanding  HOME EXERCISE PROGRAM: Access Code: [email or web address] URL: [email or web address] Date: 10/11/2024 Prepared by: Lamar Ivory  Exercises - Supine Quad Set  - 2 x daily - 7 x weekly - 2 sets - 10 reps - 3 hold - Supine Active Straight Leg Raise  - 2 x daily - 7 x weekly - 2 sets - 10 reps - Supine Heel Slides  - 2 x daily - 7 x weekly - 2 sets - 10 reps - Heel Raises with Counter Support  - 2 x daily - 7 x weekly - 2 sets - 10 reps -  Mini Squat with Counter Support  - 2 x daily - 7 x weekly - 2 sets - 10 reps - Standing March with Counter Support  - 2 x daily - 7 x weekly - 2 sets - 10 reps - Supine Quadricep Sets  - 5 x daily - 7 x weekly - 2 sets - 10 reps - 5 second  hold - Seated Straight Leg Raise   - 2 x daily - 7 x weekly - 3-5 sets - 5 reps  ASSESSMENT:  CLINICAL IMPRESSION: Pt tolerated session well today and overall doing well with PT.  Anticipate d/c next visit.  OBJECTIVE IMPAIRMENTS: decreased balance, decreased endurance, difficulty walking, decreased ROM, decreased strength, hypomobility, increased edema, impaired flexibility, and pain.   ACTIVITY LIMITATIONS: bending, sitting, standing, squatting, stairs, transfers, bed mobility, bathing, and toileting  PARTICIPATION LIMITATIONS: meal prep, driving, and community activity  PERSONAL FACTORS: --are also affecting patient's functional outcome.   REHAB POTENTIAL: Good  CLINICAL DECISION MAKING: Stable/uncomplicated  EVALUATION COMPLEXITY: Moderate   GOALS: Goals reviewed with patient? Yes  SHORT TERM GOALS: Target date: 10/20/2024     Pt to be independent with HEP. Baseline: Goal status: Met 10/20/2024  2.  Decrease pain by 1 level. Baseline:  Goal status: Met 10/11/2024  3.  Pt to be able to ambulate short community distances with SPC. Baseline: RW Goal status: Met 10/11/2024  LONG TERM GOALS: Target date: 01/13/2025    Pt to be independent with self progressive HEP at discharge.   Baseline:  Goal status: on going 12/02/2024  2.  Decrease pain to max 2/10 with all activities. Baseline:  Goal status: on going 12/02/2024  3.  Increase AROM to 0> 105+L knee.  Goal status: on going 12/02/2024  4.  Increase strength to at least 4>4+/5 in L LE.  Goal status: on going 12/02/2024  5.  Able to ambulate community distances without an AD.  Goal status: on going 12/02/2024  6.  Increase score of PSFS to > 5.  Goal status: met on average 10/31/2024   PLAN:  PT FREQUENCY: 2x/week  PT DURATION: 6 weeks  PLANNED INTERVENTIONS: 97164- PT Re-evaluation, 97110-Therapeutic exercises, 97530- Therapeutic activity, 97112- Neuromuscular re-education, 97535- Self Care, [id number]-  Manual therapy, [phone]- Gait training, [phone]- Aquatic Therapy, [phone]- Electrical stimulation (unattended), [phone]- Vasopneumatic device, Patient/Family education, Balance training, Stair training, Joint mobilization, and Scar mobilization  PLAN FOR NEXT SESSION:  last approved visit; plan to check goals and d/c  Be aware of her fibular fracture. *has been cleared*  **4/6 on authorization**  Corean JULIANNA Ku, PT, DPT 12/16/24 10:13 AM    Date of referral: 09/08/24 Referring provider: Jerona Sage MD Referring diagnosis?  M17.12 (ICD-10-CM) - Osteoarthritis of left knee, unspecified osteoarthritis type  Z96.652 (ICD-10-CM) - S/P total knee arthroplasty, left   Treatment diagnosis? (if different than referring diagnosis) M25.562  R60.0  R26.2  F37.18M73.10  What was this (referring dx) caused by? Arthritis  Nature of Condition: Chronic (continuous duration > 3 months)   Laterality: Lt  Current Functional Measure Score: Patient Specific Functional Scale 6.25  Objective measurements identify impairments when they are compared to normal values, the uninvolved extremity, and prior level of function.  [x]  Yes  []  No  Objective assessment of functional ability: Moderate functional limitations   Briefly describe symptoms: pain, decreased ROM, decreased strength  How did symptoms start: Pain in knee started in the spring  Average pain intensity:  Last 24 hours: 3/10  Past week: 2-3/10  How often does the pt experience symptoms? Constantly  How much have the symptoms interfered with usual daily activities? Quite a bit  How has condition changed since care began at this facility? A little better  In general, how is the patients overall health? Good   BACK PAIN (STarT Back Screening Tool) No  "

## 2024-12-21 ENCOUNTER — Ambulatory Visit

## 2024-12-21 DIAGNOSIS — R262 Difficulty in walking, not elsewhere classified: Secondary | ICD-10-CM | POA: Diagnosis not present

## 2024-12-21 DIAGNOSIS — M25562 Pain in left knee: Secondary | ICD-10-CM

## 2024-12-21 DIAGNOSIS — M6281 Muscle weakness (generalized): Secondary | ICD-10-CM | POA: Diagnosis not present

## 2024-12-21 DIAGNOSIS — R2689 Other abnormalities of gait and mobility: Secondary | ICD-10-CM

## 2024-12-21 DIAGNOSIS — R600 Localized edema: Secondary | ICD-10-CM | POA: Diagnosis not present

## 2024-12-23 ENCOUNTER — Encounter
